# Patient Record
Sex: Male | Born: 1935 | Race: White | Hispanic: No | Marital: Married | Smoking: Former smoker
Health system: Southern US, Community
[De-identification: ages and names within clinical notes are randomized; demographics above are authoritative.]

## PROBLEM LIST (undated history)

## (undated) DIAGNOSIS — M1990 Unspecified osteoarthritis, unspecified site: Secondary | ICD-10-CM

## (undated) DIAGNOSIS — I10 Essential (primary) hypertension: Secondary | ICD-10-CM

## (undated) DIAGNOSIS — K635 Polyp of colon: Secondary | ICD-10-CM

## (undated) DIAGNOSIS — E785 Hyperlipidemia, unspecified: Secondary | ICD-10-CM

## (undated) DIAGNOSIS — I350 Nonrheumatic aortic (valve) stenosis: Secondary | ICD-10-CM

## (undated) DIAGNOSIS — I509 Heart failure, unspecified: Secondary | ICD-10-CM

## (undated) DIAGNOSIS — Z952 Presence of prosthetic heart valve: Secondary | ICD-10-CM

## (undated) DIAGNOSIS — C801 Malignant (primary) neoplasm, unspecified: Secondary | ICD-10-CM

## (undated) DIAGNOSIS — M419 Scoliosis, unspecified: Secondary | ICD-10-CM

## (undated) DIAGNOSIS — I5032 Chronic diastolic (congestive) heart failure: Secondary | ICD-10-CM

## (undated) DIAGNOSIS — M549 Dorsalgia, unspecified: Secondary | ICD-10-CM

## (undated) DIAGNOSIS — K5730 Diverticulosis of large intestine without perforation or abscess without bleeding: Secondary | ICD-10-CM

## (undated) DIAGNOSIS — R234 Changes in skin texture: Secondary | ICD-10-CM

## (undated) DIAGNOSIS — N400 Enlarged prostate without lower urinary tract symptoms: Secondary | ICD-10-CM

## (undated) HISTORY — DX: Scoliosis, unspecified: M41.9

## (undated) HISTORY — DX: Polyp of colon: K63.5

## (undated) HISTORY — DX: Chronic diastolic (congestive) heart failure: I50.32

## (undated) HISTORY — DX: Heart failure, unspecified: I50.9

## (undated) HISTORY — DX: Diverticulosis of large intestine without perforation or abscess without bleeding: K57.30

## (undated) HISTORY — DX: Hyperlipidemia, unspecified: E78.5

## (undated) HISTORY — DX: Benign prostatic hyperplasia without lower urinary tract symptoms: N40.0

## (undated) HISTORY — DX: Dorsalgia, unspecified: M54.9

## (undated) HISTORY — PX: PLEURAL EFFUSION DRAINAGE: SHX5099

## (undated) HISTORY — PX: TONSILLECTOMY: SUR1361

## (undated) HISTORY — PX: TEE WITHOUT CARDIOVERSION: SHX5443

## (undated) HISTORY — DX: Essential (primary) hypertension: I10

## (undated) HISTORY — PX: EYE SURGERY: SHX253

## (undated) HISTORY — DX: Nonrheumatic aortic (valve) stenosis: I35.0

---
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
## 1986-03-20 DIAGNOSIS — S32810B Multiple fractures of pelvis with stable disruption of pelvic ring, initial encounter for open fracture: Secondary | ICD-10-CM | POA: Insufficient documentation

## 1994-02-17 DIAGNOSIS — K5730 Diverticulosis of large intestine without perforation or abscess without bleeding: Secondary | ICD-10-CM | POA: Insufficient documentation

## 1994-03-20 DIAGNOSIS — K648 Other hemorrhoids: Secondary | ICD-10-CM | POA: Insufficient documentation

## 1995-09-05 DIAGNOSIS — E785 Hyperlipidemia, unspecified: Secondary | ICD-10-CM

## 1995-09-05 DIAGNOSIS — K649 Unspecified hemorrhoids: Secondary | ICD-10-CM | POA: Insufficient documentation

## 1995-09-05 HISTORY — DX: Hyperlipidemia, unspecified: E78.5

## 1997-05-18 ENCOUNTER — Encounter: Payer: Self-pay | Admitting: Family Medicine

## 1997-05-18 DIAGNOSIS — M1610 Unilateral primary osteoarthritis, unspecified hip: Secondary | ICD-10-CM

## 1997-05-18 LAB — CONVERTED CEMR LAB: PSA: 0.6 ng/mL

## 1997-06-18 HISTORY — PX: KNEE ARTHROSCOPY: SUR90

## 1999-01-19 ENCOUNTER — Encounter: Payer: Self-pay | Admitting: Family Medicine

## 1999-01-19 LAB — CONVERTED CEMR LAB: PSA: 0.5 ng/mL

## 1999-05-19 DIAGNOSIS — D049 Carcinoma in situ of skin, unspecified: Secondary | ICD-10-CM | POA: Insufficient documentation

## 2000-10-18 DIAGNOSIS — N400 Enlarged prostate without lower urinary tract symptoms: Secondary | ICD-10-CM

## 2000-10-18 HISTORY — DX: Benign prostatic hyperplasia without lower urinary tract symptoms: N40.0

## 2002-04-20 ENCOUNTER — Encounter: Payer: Self-pay | Admitting: Family Medicine

## 2002-04-20 LAB — CONVERTED CEMR LAB: PSA: 0.5 ng/mL

## 2002-05-19 HISTORY — PX: COLONOSCOPY W/ BIOPSIES: SHX1374

## 2002-06-05 DIAGNOSIS — K635 Polyp of colon: Secondary | ICD-10-CM

## 2002-06-05 DIAGNOSIS — K5730 Diverticulosis of large intestine without perforation or abscess without bleeding: Secondary | ICD-10-CM

## 2002-06-05 HISTORY — DX: Polyp of colon: K63.5

## 2002-06-05 HISTORY — DX: Diverticulosis of large intestine without perforation or abscess without bleeding: K57.30

## 2003-04-21 DIAGNOSIS — I509 Heart failure, unspecified: Secondary | ICD-10-CM

## 2003-04-21 HISTORY — DX: Heart failure, unspecified: I50.9

## 2003-05-19 ENCOUNTER — Encounter: Payer: Self-pay | Admitting: Family Medicine

## 2003-05-19 DIAGNOSIS — I10 Essential (primary) hypertension: Secondary | ICD-10-CM

## 2003-05-19 DIAGNOSIS — I110 Hypertensive heart disease with heart failure: Secondary | ICD-10-CM

## 2003-05-19 HISTORY — DX: Essential (primary) hypertension: I10

## 2004-04-25 ENCOUNTER — Ambulatory Visit: Payer: Self-pay | Admitting: Family Medicine

## 2004-06-18 ENCOUNTER — Encounter: Payer: Self-pay | Admitting: Family Medicine

## 2004-06-18 LAB — CONVERTED CEMR LAB: PSA: 0.21 ng/mL

## 2004-06-24 ENCOUNTER — Ambulatory Visit: Payer: Self-pay | Admitting: Family Medicine

## 2004-06-28 ENCOUNTER — Ambulatory Visit: Payer: Self-pay | Admitting: Family Medicine

## 2004-07-13 ENCOUNTER — Ambulatory Visit: Payer: Self-pay | Admitting: Family Medicine

## 2004-11-03 ENCOUNTER — Ambulatory Visit: Payer: Self-pay | Admitting: Family Medicine

## 2004-12-29 ENCOUNTER — Ambulatory Visit: Payer: Self-pay | Admitting: Family Medicine

## 2005-03-22 ENCOUNTER — Ambulatory Visit: Payer: Self-pay | Admitting: Family Medicine

## 2005-05-31 ENCOUNTER — Ambulatory Visit: Payer: Self-pay | Admitting: Family Medicine

## 2005-06-18 ENCOUNTER — Encounter: Payer: Self-pay | Admitting: Family Medicine

## 2005-06-18 LAB — CONVERTED CEMR LAB: PSA: 0.26 ng/mL

## 2005-06-26 ENCOUNTER — Ambulatory Visit: Payer: Self-pay | Admitting: Family Medicine

## 2005-06-29 ENCOUNTER — Ambulatory Visit: Payer: Self-pay | Admitting: Family Medicine

## 2005-07-12 ENCOUNTER — Ambulatory Visit: Payer: Self-pay | Admitting: Family Medicine

## 2005-07-18 HISTORY — PX: FLEXIBLE SIGMOIDOSCOPY: SHX1649

## 2005-07-20 ENCOUNTER — Ambulatory Visit: Payer: Self-pay | Admitting: Gastroenterology

## 2005-07-27 ENCOUNTER — Ambulatory Visit: Payer: Self-pay | Admitting: Gastroenterology

## 2005-07-27 LAB — HM SIGMOIDOSCOPY

## 2005-08-09 ENCOUNTER — Ambulatory Visit: Payer: Self-pay | Admitting: Family Medicine

## 2005-09-21 ENCOUNTER — Ambulatory Visit: Payer: Self-pay | Admitting: Family Medicine

## 2005-11-23 ENCOUNTER — Ambulatory Visit: Payer: Self-pay | Admitting: Family Medicine

## 2005-12-19 ENCOUNTER — Ambulatory Visit: Payer: Self-pay | Admitting: Family Medicine

## 2006-02-14 ENCOUNTER — Ambulatory Visit: Payer: Self-pay | Admitting: Family Medicine

## 2006-06-19 ENCOUNTER — Encounter: Payer: Self-pay | Admitting: Family Medicine

## 2006-07-03 ENCOUNTER — Ambulatory Visit: Payer: Self-pay | Admitting: Family Medicine

## 2006-07-03 LAB — CONVERTED CEMR LAB
ALT: 17 units/L (ref 0–40)
Albumin: 3.8 g/dL (ref 3.5–5.2)
Alkaline Phosphatase: 49 units/L (ref 39–117)
BUN: 13 mg/dL (ref 6–23)
CO2: 33 meq/L — ABNORMAL HIGH (ref 19–32)
Calcium: 9.4 mg/dL (ref 8.4–10.5)
GFR calc Af Amer: 105 mL/min
Pro B Natriuretic peptide (BNP): 52 pg/mL (ref 0.0–100.0)
TSH: 0.98 microintl units/mL (ref 0.35–5.50)
Total CHOL/HDL Ratio: 3.2
Total Protein: 6.4 g/dL (ref 6.0–8.3)
VLDL: 13 mg/dL (ref 0–40)

## 2006-07-05 ENCOUNTER — Ambulatory Visit: Payer: Self-pay | Admitting: Family Medicine

## 2006-07-10 ENCOUNTER — Ambulatory Visit: Payer: Self-pay | Admitting: Cardiology

## 2006-07-19 LAB — CONVERTED CEMR LAB
OCCULT 1: NEGATIVE
OCCULT 2: NEGATIVE
OCCULT 3: NEGATIVE

## 2006-07-20 ENCOUNTER — Ambulatory Visit: Payer: Self-pay

## 2006-07-20 ENCOUNTER — Encounter: Payer: Self-pay | Admitting: Family Medicine

## 2006-07-30 ENCOUNTER — Ambulatory Visit: Payer: Self-pay | Admitting: Family Medicine

## 2006-07-31 ENCOUNTER — Encounter: Payer: Self-pay | Admitting: Family Medicine

## 2006-08-01 ENCOUNTER — Ambulatory Visit: Payer: Self-pay | Admitting: Internal Medicine

## 2006-12-19 ENCOUNTER — Ambulatory Visit: Payer: Self-pay | Admitting: Family Medicine

## 2007-01-02 ENCOUNTER — Ambulatory Visit: Payer: Self-pay | Admitting: Family Medicine

## 2007-01-02 DIAGNOSIS — K5900 Constipation, unspecified: Secondary | ICD-10-CM | POA: Insufficient documentation

## 2007-05-07 ENCOUNTER — Ambulatory Visit: Payer: Self-pay | Admitting: Family Medicine

## 2007-06-26 ENCOUNTER — Ambulatory Visit: Payer: Self-pay | Admitting: Family Medicine

## 2007-06-26 LAB — CONVERTED CEMR LAB
ALT: 26 units/L (ref 0–53)
AST: 23 units/L (ref 0–37)
Albumin: 4 g/dL (ref 3.5–5.2)
BUN: 16 mg/dL (ref 6–23)
Basophils Absolute: 0 10*3/uL (ref 0.0–0.1)
Basophils Relative: 0 % (ref 0.0–1.0)
Calcium: 9.5 mg/dL (ref 8.4–10.5)
Cholesterol: 170 mg/dL (ref 0–200)
Creatinine, Ser: 1 mg/dL (ref 0.4–1.5)
Eosinophils Absolute: 0.1 10*3/uL (ref 0.0–0.7)
Eosinophils Relative: 2.6 % (ref 0.0–5.0)
GFR calc non Af Amer: 77 mL/min
HCT: 39.7 % (ref 39.0–52.0)
Hemoglobin: 13.6 g/dL (ref 13.0–17.0)
MCHC: 34.2 g/dL (ref 30.0–36.0)
MCV: 91.8 fL (ref 78.0–100.0)
Neutro Abs: 3.3 10*3/uL (ref 1.4–7.7)
PSA: 0.21 ng/mL (ref 0.10–4.00)
RBC: 4.32 M/uL (ref 4.22–5.81)
TSH: 0.77 microintl units/mL (ref 0.35–5.50)
Total Bilirubin: 1.1 mg/dL (ref 0.3–1.2)
VLDL: 13 mg/dL (ref 0–40)
WBC: 5.3 10*3/uL (ref 4.5–10.5)

## 2007-07-03 ENCOUNTER — Ambulatory Visit: Payer: Self-pay | Admitting: Family Medicine

## 2007-07-09 ENCOUNTER — Ambulatory Visit: Payer: Self-pay | Admitting: Cardiology

## 2007-12-16 ENCOUNTER — Ambulatory Visit: Payer: Self-pay | Admitting: Family Medicine

## 2007-12-24 ENCOUNTER — Ambulatory Visit: Payer: Self-pay | Admitting: Family Medicine

## 2008-05-25 ENCOUNTER — Ambulatory Visit: Payer: Self-pay | Admitting: Family Medicine

## 2008-06-02 ENCOUNTER — Ambulatory Visit: Payer: Self-pay | Admitting: Cardiology

## 2008-06-23 ENCOUNTER — Encounter: Payer: Self-pay | Admitting: Cardiology

## 2008-06-23 ENCOUNTER — Ambulatory Visit: Payer: Self-pay

## 2008-09-04 ENCOUNTER — Ambulatory Visit: Payer: Self-pay | Admitting: Internal Medicine

## 2008-11-27 ENCOUNTER — Ambulatory Visit: Payer: Self-pay | Admitting: Family Medicine

## 2008-11-27 LAB — CONVERTED CEMR LAB
Albumin: 4.2 g/dL (ref 3.5–5.2)
Basophils Absolute: 0 10*3/uL (ref 0.0–0.1)
Basophils Relative: 0.1 % (ref 0.0–3.0)
CO2: 33 meq/L — ABNORMAL HIGH (ref 19–32)
Calcium: 9.4 mg/dL (ref 8.4–10.5)
Chloride: 100 meq/L (ref 96–112)
Eosinophils Absolute: 0.1 10*3/uL (ref 0.0–0.7)
Glucose, Bld: 93 mg/dL (ref 70–99)
HDL: 62 mg/dL (ref >39.00)
Hemoglobin: 14.6 g/dL (ref 13.0–17.0)
Lymphs Abs: 1.3 10*3/uL (ref 0.7–4.0)
MCHC: 34.5 g/dL (ref 30.0–36.0)
MCV: 93.6 fL (ref 78.0–100.0)
Monocytes Absolute: 0.5 10*3/uL (ref 0.1–1.0)
Neutro Abs: 3.5 10*3/uL (ref 1.4–7.7)
PSA: 0.17 ng/mL (ref 0.10–4.00)
RBC: 4.53 M/uL (ref 4.22–5.81)
RDW: 12.3 % (ref 11.5–14.6)
Sodium: 137 meq/L (ref 135–145)
TSH: 0.88 microintl units/mL (ref 0.35–5.50)
Total CHOL/HDL Ratio: 3
Total Protein: 6.6 g/dL (ref 6.0–8.3)
Triglycerides: 42 mg/dL (ref 0.0–149.0)

## 2008-12-24 ENCOUNTER — Ambulatory Visit: Payer: Self-pay | Admitting: Family Medicine

## 2008-12-31 ENCOUNTER — Ambulatory Visit: Payer: Self-pay | Admitting: Family Medicine

## 2009-01-14 ENCOUNTER — Encounter (INDEPENDENT_AMBULATORY_CARE_PROVIDER_SITE_OTHER): Payer: Self-pay | Admitting: *Deleted

## 2009-01-14 ENCOUNTER — Ambulatory Visit: Payer: Self-pay | Admitting: Family Medicine

## 2009-01-14 LAB — CONVERTED CEMR LAB
OCCULT 1: NEGATIVE
OCCULT 2: NEGATIVE

## 2009-02-19 ENCOUNTER — Ambulatory Visit: Payer: Self-pay | Admitting: Family Medicine

## 2009-04-21 ENCOUNTER — Telehealth: Payer: Self-pay | Admitting: Family Medicine

## 2009-05-13 ENCOUNTER — Ambulatory Visit: Payer: Self-pay | Admitting: Family Medicine

## 2009-06-16 ENCOUNTER — Ambulatory Visit: Payer: Self-pay | Admitting: Family Medicine

## 2009-06-22 ENCOUNTER — Ambulatory Visit: Payer: Self-pay | Admitting: Cardiovascular Disease

## 2009-07-27 ENCOUNTER — Ambulatory Visit: Payer: Self-pay | Admitting: Family Medicine

## 2009-07-27 LAB — CONVERTED CEMR LAB: AST: 20 units/L (ref 0–37)

## 2009-08-24 ENCOUNTER — Telehealth: Payer: Self-pay | Admitting: Family Medicine

## 2009-10-21 ENCOUNTER — Encounter (INDEPENDENT_AMBULATORY_CARE_PROVIDER_SITE_OTHER): Payer: Self-pay | Admitting: *Deleted

## 2009-12-03 ENCOUNTER — Ambulatory Visit: Payer: Self-pay | Admitting: Family Medicine

## 2009-12-23 ENCOUNTER — Telehealth (INDEPENDENT_AMBULATORY_CARE_PROVIDER_SITE_OTHER): Payer: Self-pay | Admitting: *Deleted

## 2009-12-23 ENCOUNTER — Ambulatory Visit: Payer: Self-pay | Admitting: Family Medicine

## 2009-12-23 LAB — CONVERTED CEMR LAB
Alkaline Phosphatase: 54 units/L (ref 39–117)
BUN: 12 mg/dL (ref 6–23)
Basophils Relative: 0.5 % (ref 0.0–3.0)
Bilirubin, Direct: 0.2 mg/dL (ref 0.0–0.3)
CO2: 33 meq/L — ABNORMAL HIGH (ref 19–32)
Chloride: 95 meq/L — ABNORMAL LOW (ref 96–112)
Cholesterol: 150 mg/dL (ref 0–200)
Creatinine, Ser: 0.9 mg/dL (ref 0.4–1.5)
Eosinophils Absolute: 0.1 10*3/uL (ref 0.0–0.7)
Eosinophils Relative: 2.6 % (ref 0.0–5.0)
Glucose, Bld: 86 mg/dL (ref 70–99)
HCT: 37.4 % — ABNORMAL LOW (ref 39.0–52.0)
LDL Cholesterol: 78 mg/dL (ref 0–99)
Lymphs Abs: 1.3 10*3/uL (ref 0.7–4.0)
MCHC: 35.2 g/dL (ref 30.0–36.0)
MCV: 91.8 fL (ref 78.0–100.0)
Monocytes Absolute: 0.4 10*3/uL (ref 0.1–1.0)
Platelets: 199 10*3/uL (ref 150.0–400.0)
Total CHOL/HDL Ratio: 2
WBC: 4.3 10*3/uL — ABNORMAL LOW (ref 4.5–10.5)

## 2009-12-29 ENCOUNTER — Ambulatory Visit: Payer: Self-pay | Admitting: Family Medicine

## 2010-03-03 ENCOUNTER — Encounter: Payer: Self-pay | Admitting: Family Medicine

## 2010-04-21 NOTE — Progress Notes (Signed)
Summary: regarding appt  Phone Note Call from Patient Call back at Home Phone [phone]   Caller: Patient Call For: Shaune Leeks MD Summary of Call: Pt was told to schedule physical appt in august but he says if you are coming back he wants to stay with you.  Advised him that you will probably want him to get established with another doctor but he wanted me to ask. Initial call taken by: Lowella Petties CMA,  August 24, 2009 10:01 AM  Follow-up for Phone Call        Staying with me is fine...hopefully I will know in another week or so. Call back to schedule in 2 weeks, Oct/Nov w/me if they have a schedule for me, Aug with Drs Para March or Guitierrez if not. Follow-up by: Shaune Leeks MD,  August 24, 2009 10:57 AM  Additional Follow-up for Phone Call Additional follow up Details #1::        [facility] for pt to call back.            Lowella Petties CMA  August 24, 2009 11:57 AM  Advised pt, he will call back in a couple of weeks to check. Additional Follow-up by: Lowella Petties CMA,  August 24, 2009 2:39 PM

## 2010-04-21 NOTE — Progress Notes (Signed)
----   Converted from flag ---- ---- 12/23/2009 7:06 AM, Shaune Leeks MD wrote: BMET 401.9 PSA 600.00 CBC 716.95 CHOL PROF 272.4 HEPATIC 272.4 VIT D 780.79 TSH 780.79  ---- 12/22/2009 11:39 AM, Liane Comber CMA (AAMA) wrote:  Lab orders please! Good Morning! This pt is scheduled for cpx labs tomorrow, which labs to draw and dx codes to use? Thanks Rodney Booze   P.S Welcome back!!!!! ------------------------------

## 2010-04-21 NOTE — Assessment & Plan Note (Signed)
Summary: EC6      Allergies Added: NKDA  Visit Type:  EC6 Referring Provider:  Daleen Parker Primary Provider:  Shaune Leeks MD  CC:  some chest pains - depends on how fast he tries to get. No sob and some edema in ankles and feet...  History of Present Illness: [name] is a 77 year old gentleman with no known coronary artery disease, history of hypertension, hyperlipidemia, chronic mild shortness of breath, aortic valve sclerosis who presents for routine followup.  He states that overall he has been doing well. He denies any significant shortness of breath. He is active, with no symptoms of chest pain, lightheadedness or dizziness. He denies any significant lower extremity edema.  He was recently taken off pravastatin and put back on Lipitor. He states that pravastatin did not make him feel well.  Echocardiogram from April 2010 showed normal systolic function, minimal aortic valve stenosis, mildly dilated left atrium, mild TR.  Carotid arterial Doppler done last year showed mild plaquing less than 39% bilaterally.  Last stress test was 3 years ago with no ischemia. He exercised for 5 minutes, 30 seconds. Achieved 13 METS  Preventive Screening-Counseling & Management  Alcohol-Tobacco     Alcohol drinks/day: 0     Smoking Status: quit  Caffeine-Diet-Exercise     Caffeine use/day: 5-6 cups     Does Patient Exercise: no  Current Problems (verified): 1)  Dysfunction of Eustachian Tube  (ICD-381.81) 2)  Tiredness  (ICD-780.79) 3)  Chest Pain  (ICD-786.50) 4)  Constipation, Intermittent  (ICD-564.00) 5)  Screening For Malignannt Neoplasm, Site Nec  (ICD-V76.49) 6)  Family History Diabetes 1st Degree Relative  (ICD-V18.0) 7)  Congestive Heart Failure  (ICD-428.0) 8)  Hypertension  (ICD-401.9) 9)  Benign Prostatic Hypertrophy  (ICD-600.00) 10)  Ca in Situ, Skin Nos  (ICD-232.9) 11)  Arthritis, Hip  (ICD-716.95) 12)  Diverticulosis, Colon  (ICD-562.10) 13)   Hemorrhoids, Internal, With Bleeding  (ICD-455.2) 14)  Hyperlipidemia  (ICD-272.4) 15)  Scoliosis, Thoracic Spine  (ICD-737.30) 16)  Hemorrhoids  (ICD-455.6) 17)  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 1 Tablet in Am 2)  Proscar 5 Mg Tabs (Finasteride) .Marland Kitchen.. 1 Tablet At Bedtime 3)  Captopril 25 Mg Tabs (Captopril) .... Take 1 Tablet By Mouth  in Am 2 Tablets At Night. 4)  Lipitor 40 Mg Tabs (Atorvastatin Calcium) .... One Tab At Night  Allergies (verified): No Known Drug Allergies  Past History:  Past Medical History: Last updated: 06/19/2006 Hyperlipidemia (09/05/1995) Diverticulosis, colon (02/17/1994) Benign prostatic hypertrophy (10/18/2000) Hypertension (05/19/2003) Congestive heart failure (04/21/2003)  Past Surgical History: Last updated: 06/19/2006 ARTHROSCOPY R KNEE 06/1997 FLEX SIG NML       BE DIVERTICS   02/1994 FLEX SIG INT/EXT HEMMS   LESION AT 55CM  01/1999 COLONOSCOPY  INT HEMMS INJECTED DIVERTICS  03/1999 COLONOSCOPY POLYP B9 DIVERTICS INT HEMMS  06/05/2002     05 ECHO EF 65% NML X MILD DIAST DYSFCTN  05/08/2003 FLEX SIG TO 50CM  NO MASS O/W NO CHANGE  07/27/2005  Family History: Last updated: 12/24/2008 Father dec Stroke but natural causes Mother dec 98 natural causes Sister A 80  Fell hip fx Sister dec 30s Natural Sister dec 63s Natural Brother A 72 DM Brother A 83 DM Carotid Stenosis Family History Diabetes 1st degree relative Family History Hypertension Family History of Stroke F 1st degree relative <60 Family History of Stroke M 1st  degree relative <50  Social History: Last updated: 07/03/2007 Occupation: Retired Electrical engineer Single Monogamous  Risk Factors: Alcohol Use: 0 (06/22/2009) Caffeine Use: 5-6 cups (06/22/2009) Exercise: no (06/22/2009)  Risk Factors: Smoking Status: quit  (06/22/2009) Packs/Day: 1975 (12/24/2008) Passive Smoke Exposure: no (12/24/2008)  Social History: Caffeine use/day:  5-6 cups  Review of Systems  The patient denies fever, weight loss, weight gain, vision loss, decreased hearing, hoarseness, chest pain, syncope, dyspnea on exertion, peripheral edema, prolonged cough, abdominal pain, incontinence, muscle weakness, depression, and enlarged lymph nodes.    Vital Signs:  Patient profile:   77 year old male Height:      69.75 inches Weight:      169.25 pounds BMI:     24.55 Pulse rate:   68 / minute Pulse rhythm:   regular BP sitting:   124 / 66  (left arm) Cuff size:   regular  Vitals Entered By: Douglas Parker (June 22, 2009 10:31 AM)  Physical Exam  General:  well-appearing elderly gentleman in no apparent distress, alert and oriented x3, HEENT exam is benign, oropharynx is clear, neck is supple with no JVP or carotid bruits, heart sounds are regular with S1-S2 and 2/6 systolic ejection murmur at the right sternal border, lungs are clear to auscultation with no wheezes or rales, abdominal exam is benign, no significant lower extremity edema, neurologic exam is nonfocal, skin is     EKG  Procedure date:  06/22/2009  Findings:      normal sinus rhythm with rate 68 beats per minute, no significant ST or T wave changes.  Impression & Recommendations:  Problem # 1:  HYPERTENSION (ICD-401.9) blood pressure is well controlled on his current medication regimen. We have made no changes. His updated medication list for this problem includes:    Captopril-hydrochlorothiazide 25-15 Mg Tabs (Captopril-hydrochlorothiazide) .Marland Kitchen... 1 tablet in am    Captopril 25 Mg Tabs (Captopril) .Marland Kitchen... Take 1 tablet by mouth  in am 2 tablets at night.  Problem # 2:  HYPERLIPIDEMIA (ICD-272.4) he does have mild peripheral vascular disease. History of smoking though he stopped 40 years ago. He currently takes an aspirin 81 mg daily. He is on  Lipitor 40 mg daily. Last cholesterol in September 2010 showing LDL around 100. We have suggested that he stay on the same dose of Lipitor if he gets muscle aches in the future, we could change him to low-dose Crestor.  The following medications were removed from the medication list:    Pravastatin Sodium 40 Mg Tabs (Pravastatin sodium) .Marland Kitchen... Take one by mouth every night His updated medication list for this problem includes:    Lipitor 40 Mg Tabs (Atorvastatin calcium) ..... One tab at night  Patient Instructions: 1)  Your physician recommends that you schedule a follow-up appointment in: 1 year 2)  Your physician recommends that you continue on your current medications as directed. Please refer to the Current Medication list given to you today.

## 2010-04-21 NOTE — Assessment & Plan Note (Signed)
Summary: DISCUSS MEDICATION/CLE   Vital Signs:  Patient profile:   77 year old male Weight:      172 pounds Temp:     97.5 degrees F oral Pulse rate:   72 / minute Pulse rhythm:   irregular BP sitting:   148 / 74  (left arm) Cuff size:   regular  Vitals Entered By: Sydell Axon LPN (May 13, 2009 12:15 PM)  History of Present Illness: Pt here for no energy...he feels like he did when on chol medication in the past...bettrer whebn stopped. He has been recentl switched from Lipitor 10 to Pravachol 40 due to insurance cost. He says he tolerated the Lipitor w/o problem. He is not congested and has no focal complaints...nmerely washed out with no energy.  Problems Prior to Update: 1)  Chest Pain  (ICD-786.50) 2)  Constipation, Intermittent  (ICD-564.00) 3)  Screening For Malignannt Neoplasm, Site Nec  (ICD-V76.49) 4)  Family History Diabetes 1st Degree Relative  (ICD-V18.0) 5)  Congestive Heart Failure  (ICD-428.0) 6)  Hypertension  (ICD-401.9) 7)  Benign Prostatic Hypertrophy  (ICD-600.00) 8)  Ca in Situ, Skin Nos  (ICD-232.9) 9)  Arthritis, Hip  (ICD-716.95) 10)  Diverticulosis, Colon  (ICD-562.10) 11)  Hemorrhoids, Internal, With Bleeding  (ICD-455.2) 12)  Hyperlipidemia  (ICD-272.4) 13)  Scoliosis, Thoracic Spine  (ICD-737.30) 14)  Hemorrhoids  (ICD-455.6) 15)  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 1 Tablet in Am 2)  Proscar 5 Mg Tabs (Finasteride) .Marland Kitchen.. 1 Tablet At Bedtime 3)  Captopril 25 Mg Tabs (Captopril) .... Take 1 Tablet By Mouth  in Am 2 Tablets At Night. 4)  Pravachol 40 Mg Tabs (Pravastatin Sodium) .... One Tab By Mouth At Night 5)  Zithromax Z-Pak 250 Mg Tabs (Azithromycin) .... Take By Mouth As Directed 6)  Capital/codeine 120-12 Mg/72ml Susp (Acetaminophen-Codeine) .Marland Kitchen.. 1 Teaspoon At Bedtime As Needed Cough  Allergies: No  Known Drug Allergies  Physical Exam  General:  Well-developed,well-nourished,in no acute distress; alert,appropriate and cooperative throughout examination Head:  normocephalic, atraumatic, and no abnormalities observed.  no sinus tenderness Eyes:  vision grossly intact, pupils equal, pupils round, pupils reactive to light, and no injection.   Ears:  R ear normal and L ear normal.  with moderate cerumen Nose:  nares are congested and injected bilat  Mouth:  pharynx pink and moist, no erythema, and no exudates.   Neck:  supple with full rom and no masses or thyromegally, no JVD or carotid bruit  Lungs:  CTA with mild rhonchi at bases / no rales or crackles or wheeze  good air exch and no labored breathing  Heart:  Normal rate and regular rhythm. Grade 2 mid-systolic murmur present. Abdomen:  Bowel sounds positive,abdomen soft and non-tender without masses, organomegaly or hernias noted.   Impression & Recommendations:  Problem # 1:  TIREDNESS (ICD-780.79) Assessment New Stop Pravachol for one month. RTC then. Cancel next three appts for labsx2 and RTC.   Problem # 2:  HYPERLIPIDEMIA (ICD-272.4)  Will probably restart Lipitor if things return to nml. The following medications were removed from the medication list:    Pravachol 40 Mg Tabs (Pravastatin sodium) ..... One tab by mouth at night  Labs Reviewed: SGOT: 23 (11/27/2008)   SGPT: 32 (11/27/2008)   HDL:62.00 (11/27/2008), 50.7 (06/26/2007)  LDL:107 (11/27/2008), 106 (06/26/2007)  Chol:177 (11/27/2008), 170 (06/26/2007)  Trig:42.0 (11/27/2008), 67 (06/26/2007)  Complete Medication List:  1)  Captopril-hydrochlorothiazide 25-15 Mg Tabs (Captopril-hydrochlorothiazide) .Marland Kitchen.. 1 tablet in am 2)  Proscar 5 Mg Tabs (Finasteride) .Marland Kitchen.. 1 tablet at bedtime 3)  Captopril 25 Mg Tabs (Captopril) .... Take 1 tablet by mouth  in am 2 tablets at night.  Patient Instructions: 1)  RTC one month. 2)  Cnx next three appts.  Current Allergies  (reviewed today): No known allergies

## 2010-04-21 NOTE — Assessment & Plan Note (Signed)
Summary: 1 MONTH FOLLOW UP/[facility]   Vital Signs:  Patient profile:   77 year old male Weight:      169.50 pounds BMI:     24.58 Temp:     97.7 degrees F oral Pulse rate:   72 / minute Pulse rhythm:   regular BP sitting:   130 / 70  (left arm) Cuff size:   regular  Vitals Entered By: Sydell Axon LPN (June 16, 2009 9:09 AM) CC: One month follow-up, Hypertension Management   History of Present Illness: Pt herer for recheck after fatigue and feeling poorly suspected due to Pravastatin and having stopped that for one month. He feels much better now and it appears was due to the medication. He is agreeable to going back on the LIpitor. He is also here for BP check. He also complians of right ear itching and occas feeling like fluid in the ear in the AM. He denies fever or chills or cold sxs.  Hypertension History:      Positive major cardiovascular risk factors include male age 62 years old or older, hyperlipidemia, and hypertension.  Negative major cardiovascular risk factors include non-tobacco-user status.        Positive history for target organ damage include cardiac end organ damage (either CHF or LVH).     Problems Prior to Update: 1)  Tiredness  (ICD-780.79) 2)  Chest Pain  (ICD-786.50) 3)  Constipation, Intermittent  (ICD-564.00) 4)  Screening For Malignannt Neoplasm, Site Nec  (ICD-V76.49) 5)  Family History Diabetes 1st Degree Relative  (ICD-V18.0) 6)  Congestive Heart Failure  (ICD-428.0) 7)  Hypertension  (ICD-401.9) 8)  Benign Prostatic Hypertrophy  (ICD-600.00) 9)  Ca in Situ, Skin Nos  (ICD-232.9) 10)  Arthritis, Hip  (ICD-716.95) 11)  Diverticulosis, Colon  (ICD-562.10) 12)  Hemorrhoids, Internal, With Bleeding  (ICD-455.2) 13)  Hyperlipidemia  (ICD-272.4) 14)  Scoliosis, Thoracic Spine  (ICD-737.30) 15)  Hemorrhoids  (ICD-455.6) 16)  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 1 Tablet in Am 2)  Proscar 5 Mg Tabs (Finasteride) .Marland Kitchen.. 1 Tablet At Bedtime 3)  Captopril 25 Mg Tabs (Captopril) .... Take 1 Tablet By Mouth  in Am 2 Tablets At Night.  Allergies: No Known Drug Allergies  Physical Exam  General:  Well-developed,well-nourished,in no acute distress; alert,appropriate and cooperative throughout examination Head:  normocephalic, atraumatic, and no abnormalities observed.  no sinus tenderness Eyes:  Conjunctiva clear bilaterally.  Ears:  R ear normal and L ear normal.  with minimal cerumen Nose:  nares are congested and injected bilat  Mouth:  pharynx pink and moist, no erythema, and no exudates.   Lungs:  CTA with mild rhonchi at bases / no rales or crackles or wheeze  good air exch and no labored breathing  Heart:  Normal rate and regular rhythm. Grade 2 mid-systolic murmur present.   Impression & Recommendations:  Problem # 1:  HYPERLIPIDEMIA (ICD-272.4) Assessment Unchanged Start back on Lipitor but at 40mg  at night. Recheck and watch liver function. His updated medication list for this problem includes:    Pravastatin Sodium 40 Mg Tabs (Pravastatin sodium) .Marland Kitchen... Take one by mouth every night    Lipitor 40 Mg Tabs (Atorvastatin calcium) ..... One tab at night  Labs Reviewed: SGOT: 23 (11/27/2008)   SGPT: 32 (11/27/2008)  10 Yr Risk Heart Disease: 18 %   HDL:62.00 (11/27/2008), 50.7 (06/26/2007)  LDL:107 (11/27/2008), 106 (06/26/2007)  Chol:177 (11/27/2008), 170 (06/26/2007)  Trig:42.0 (11/27/2008), 67 (06/26/2007)  Problem # 2:  DYSFUNCTION OF EUSTACHIAN TUBE (ICD-381.81) Assessment: New Gargle regularly and use Cortaid in ear in AM on Qtip just inside tragus.  Problem # 3:  HYPERTENSION (ICD-401.9) Assessment: Improved Cont curr meds. His updated medication list for this problem includes:    Captopril-hydrochlorothiazide 25-15 Mg Tabs (Captopril-hydrochlorothiazide) .Marland Kitchen... 1 tablet in am     Captopril 25 Mg Tabs (Captopril) .Marland Kitchen... Take 1 tablet by mouth  in am 2 tablets at night.  BP today: 130/70 Prior BP: 148/74 (05/13/2009)  10 Yr Risk Heart Disease: 18 %  Labs Reviewed: K+: 4.3 (11/27/2008) Creat: : 0.9 (11/27/2008)   Chol: 177 (11/27/2008)   HDL: 62.00 (11/27/2008)   LDL: 107 (11/27/2008)   TG: 42.0 (11/27/2008)  Complete Medication List: 1)  Captopril-hydrochlorothiazide 25-15 Mg Tabs (Captopril-hydrochlorothiazide) .Marland Kitchen.. 1 tablet in am 2)  Proscar 5 Mg Tabs (Finasteride) .Marland Kitchen.. 1 tablet at bedtime 3)  Captopril 25 Mg Tabs (Captopril) .... Take 1 tablet by mouth  in am 2 tablets at night. 4)  Pravastatin Sodium 40 Mg Tabs (Pravastatin sodium) .... Take one by mouth every night 5)  Lipitor 40 Mg Tabs (Atorvastatin calcium) .... One tab at night  Hypertension Assessment/Plan:      The patient's hypertensive risk group is category C: Target organ damage and/or diabetes.  His calculated 10 year risk of coronary heart disease is 18 %.  Today's blood pressure is 130/70.    Patient Instructions: 1)  SGOT, SGPT 272. 4   6 WEEKS 2)  See me in 3 mos, SGOT, SGPT, CHOL PROFILE  272.  4   prior  Prescriptions: LIPITOR 40 MG TABS (ATORVASTATIN CALCIUM) one tab at night  #30 x 12   Entered and Authorized by:   Shaune Leeks MD   Signed by:   Shaune Leeks MD on 06/16/2009   Method used:   Electronically to        [facility]* (retail)       [address]       [place], [place]  [address]       Ph: [phone]       Fax: [phone]   RxID:   [id number]   Current Allergies (reviewed today): No known allergies

## 2010-04-21 NOTE — Letter (Signed)
Summary: [facility],Diagnosis Code for Vitamin D 25 Hydroxy  [facility],Diagnosis Code for Vitamin D 25 Hydroxy   Imported By: Beau Fanny 03/09/2010 13:22:26  _____________________________________________________________________  External Attachment:    Type:   Image     Comment:   External Document

## 2010-04-21 NOTE — Assessment & Plan Note (Signed)
Summary: CHECK UP/CLE   Vital Signs:  Patient profile:   77 year old male Weight:      169.25 pounds Temp:     98.2 degrees F oral Pulse rate:   64 / minute Pulse rhythm:   regular BP sitting:   132 / 70  (left arm) Cuff size:   regular  Vitals Entered By: Sydell Axon LPN (December 29, 2009 1:51 PM) CC: 30 Minute checkup   History of Present Illness: Pt here for followup. He is doing well with no complaints. He has recently seen Dr Lewie Loron with good results. He is tolerating Lipitor, which we switched to last time without a problem.  Preventive Screening-Counseling & Management  Alcohol-Tobacco     Alcohol drinks/day: 0     Smoking Status: quit     Packs/Day: 1975     Passive Smoke Exposure: no  Caffeine-Diet-Exercise     Caffeine use/day: 5-6 cups     Does Patient Exercise: no  Problems Prior to Update: 1)  Constipation, Intermittent  (ICD-564.00) 2)  Screening For Malignannt Neoplasm, Site Nec  (ICD-V76.49) 3)  Family History Diabetes 1st Degree Relative  (ICD-V18.0) 4)  Congestive Heart Failure  (ICD-428.0) 5)  Hypertension  (ICD-401.9) 6)  Benign Prostatic Hypertrophy  (ICD-600.00) 7)  Ca in Situ, Skin Nos  (ICD-232.9) 8)  Arthritis, Hip  (ICD-716.95) 9)  Diverticulosis, Colon  (ICD-562.10) 10)  Hemorrhoids, Internal, With Bleeding  (ICD-455.2) 11)  Hyperlipidemia  (ICD-272.4) 12)  Scoliosis, Thoracic Spine  (ICD-737.30) 13)  Hemorrhoids  (ICD-455.6) 14)  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 1 Tablet in Am 2)  Proscar 5 Mg Tabs (Finasteride) .Marland Kitchen.. 1 Tablet At Bedtime 3)  Captopril 25 Mg Tabs (Captopril) .... Take 1 Tablet By Mouth  in Am 2 Tablets At Night. 4)  Lipitor 40 Mg Tabs (Atorvastatin Calcium) .... One Tab At Night 5)  Zithromax 250 Mg Tabs (Azithromycin) .... 2 By Mouth X1 Day and Then 1 By Mouth X  4days.  Allergies: No Known Drug Allergies  Past History:  Past Medical History: Last updated: 06/19/2006 Hyperlipidemia (09/05/1995) Diverticulosis, colon (02/17/1994) Benign prostatic hypertrophy (10/18/2000) Hypertension (05/19/2003) Congestive heart failure (04/21/2003)  Past Surgical History: Last updated: 06/19/2006 ARTHROSCOPY R KNEE 06/1997 FLEX SIG NML       BE DIVERTICS   02/1994 FLEX SIG INT/EXT HEMMS   LESION AT 55CM  01/1999 COLONOSCOPY  INT HEMMS INJECTED DIVERTICS  03/1999 COLONOSCOPY POLYP B9 DIVERTICS INT HEMMS  06/05/2002     05 ECHO EF 65% NML X MILD DIAST DYSFCTN  05/08/2003 FLEX SIG TO 50CM  NO MASS O/W NO CHANGE  07/27/2005  Family History: Last updated: 12/29/2009 Father dec Stroke but natural causes Mother dec 98 natural causes Sister A 50  Fell hip fx Sister dec 64s Natural Sister dec 67s Natural Brother A 57 DM Brother A 84 DM Carotid Stenosis Family History Diabetes 1st degree relative Family History Hypertension Family History of Stroke F 1st degree relative <60 Family History of Stroke M 1st degree relative <50  Social History: Last updated: 07/03/2007 Occupation: Retired Electrical engineer Single Monogamous  Risk Factors: Alcohol Use: 0 (12/29/2009) Caffeine Use: 5-6 cups (12/29/2009) Exercise: no (12/29/2009)  Risk Factors: Smoking Status: quit (12/29/2009) Packs/Day: 1975 (12/29/2009) Passive Smoke Exposure: no (12/29/2009)  Family History: Father dec Stroke but natural causes Mother dec 98 natural causes Sister A 57  Fell hip  fx Sister dec 58s Natural Sister dec 33s Natural Brother A 74 DM Brother A 52 DM Carotid Stenosis Family History Diabetes 1st degree relative Family History Hypertension Family History of Stroke F 1st degree relative <60 Family History of Stroke M 1st degree relative <50  Review of Systems General:  Denies chills, fatigue, fever, sweats, weakness, and weight loss. Eyes:  Complains of  blurring; denies discharge and eye pain; operations on right three times thus far. ENT:  Denies decreased hearing, ear discharge, and earache. CV:  Complains of chest pain or discomfort; denies fainting, fatigue, palpitations, shortness of breath with exertion, swelling of feet, and swelling of hands; occas with signif rushing.Marland Kitchen Resp:  Denies cough, shortness of breath, and wheezing. GI:  Denies abdominal pain, bloody stools, change in bowel habits, constipation, dark tarry stools, diarrhea, indigestion, loss of appetite, nausea, vomiting, vomiting blood, and yellowish skin color. GU:  Complains of nocturia; denies dysuria, incontinence, and urinary frequency; once. MS:  Complains of low back pain; denies joint pain, muscle aches, muscle weakness, and stiffness. Derm:  Denies dryness, itching, and rash. Neuro:  Denies numbness, poor balance, tingling, and tremors.  Physical Exam  General:  Well-developed,well-nourished,in no acute distress; alert,appropriate and cooperative throughout examination Head:  normocephalic, atraumatic, and no abnormalities observed.  no sinus tenderness Eyes:  Conjunctiva clear bilaterally.  Ears:  R ear normal and L ear normal.  Minimal cerumen bilat. Nose:  External nasal examination shows no deformity or inflammation. Nasal mucosa are pink and moist without lesions or exudates. Mouth:  pharynx pink and moist, no erythema, and no exudates.   Neck:  supple with full rom and no masses or thyromegally, no JVD or carotid bruit  Chest Wall:  No deformities, masses, tenderness or gynecomastia noted. Lungs:  CTA with mild rhonchi at bases / no rales or crackles or wheeze  good air exch and no labored breathing  Heart:  Normal rate and regular rhythm. Grade 2 mid-systolic murmur present. Abdomen:  Bowel sounds positive,abdomen soft and non-tender without masses, organomegaly or hernias noted. Rectal:  No external abnormalities noted. Normal sphincter tone. No rectal  masses or tenderness. G neg. Genitalia:  Testes bilaterally descended without nodularity, tenderness or masses. No scrotal masses or lesions. No penis lesions or urethral discharge. Prostate:  Prostate gland firm and smooth, no enlargement, nodularity, tenderness, mass, asymmetry or induration. 10 gms. Msk:  No deformity but significant stable scoliosis noted of thoracic  spine.   Pulses:  R and L carotid,radial,femoral,dorsalis pedis and posterior tibial pulses are full and equal bilaterally Extremities:  No clubbing, cyanosis, edema, or deformity noted with very mild decreased  range of motion of all extremities..   Neurologic:  No cranial nerve deficits noted. Station and gait are normal. Sensory, motor and coordinative functions appear intact. Skin:  Intact without suspicious lesions or rashes Cervical Nodes:  No lymphadenopathy noted Inguinal Nodes:  No significant adenopathy Psych:  normal affect, talkative and pleasant    Impression & Recommendations:  Problem # 1:  HYPERTENSION (ICD-401.9) Assessment Improved  His updated medication list for this problem includes:    Captopril-hydrochlorothiazide 25-15 Mg Tabs (Captopril-hydrochlorothiazide) .Marland Kitchen... 1 tablet in am    Captopril 25 Mg Tabs (Captopril) .Marland Kitchen... Take 2 tablets by mouth at night.  Orders: Prescription Created Electronically [phone])  BP today: 132/70 Prior BP: 146/64 (12/03/2009)  Prior 10 Yr Risk Heart Disease: 18 % (06/16/2009)  Labs Reviewed: K+: 4.2 (12/23/2009) Creat: : 0.9 (12/23/2009)   Chol: 150 (12/23/2009)  HDL: 61.80 (12/23/2009)   LDL: 78 (12/23/2009)   TG: 50.0 (12/23/2009)  Problem # 2:  BENIGN PROSTATIC HYPERTROPHY (ICD-600.00) Assessment: Unchanged Stable PSA and exam.  Problem # 3:  HYPERLIPIDEMIA (ICD-272.4) Assessment: Improved Excellent nos. Cont Lipitor. His updated medication list for this problem includes:    Lipitor 40 Mg Tabs (Atorvastatin calcium) ..... One tab at night  Labs  Reviewed: SGOT: 23 (12/23/2009)   SGPT: 24 (12/23/2009)  Prior 10 Yr Risk Heart Disease: 18 % (06/16/2009)   HDL:61.80 (12/23/2009), 62.00 (11/27/2008)  LDL:78 (12/23/2009), 107 (86/57/8469)  Chol:150 (12/23/2009), 177 (11/27/2008)  Trig:50.0 (12/23/2009), 42.0 (11/27/2008)  Problem # 4:  SCOLIOSIS, THORACIC SPINE (ICD-737.30) Assessment: Unchanged Stable.  Complete Medication List: 1)  Captopril-hydrochlorothiazide 25-15 Mg Tabs (Captopril-hydrochlorothiazide) .Marland Kitchen.. 1 tablet in am 2)  Proscar 5 Mg Tabs (Finasteride) .Marland Kitchen.. 1 tablet at bedtime 3)  Captopril 25 Mg Tabs (Captopril) .... Take 2 tablets by mouth at night. 4)  Lipitor 40 Mg Tabs (Atorvastatin calcium) .... One tab at night  Other Orders: Flu Vaccine 49yrs + MEDICARE PATIENTS (G2952) Administration Flu vaccine - MCR (W4132)  Patient Instructions: 1)  RTC one year or as needed. 2)  Flu shot today. Prescriptions: CAPTOPRIL-HYDROCHLOROTHIAZIDE 25-15 MG TABS (CAPTOPRIL-HYDROCHLOROTHIAZIDE) 1 TABLET IN AM  #30 Each x 11   Entered and Authorized by:   Shaune Leeks MD   Signed by:   Shaune Leeks MD on 12/29/2009   Method used:   Electronically to        [facility] (retail)       [address]       [place], [place]  [address]       Ph: [phone]       Fax: [phone]   RxID:   [id number]   Current Allergies (reviewed today): No known allergies    Flu Vaccine Consent Questions     Do you have a history of severe allergic reactions to this vaccine? no    Any prior history of allergic reactions to egg and/or gelatin? no    Do you have a sensitivity to the preservative Thimersol? no    Do you have a past history of Guillan-Barre Syndrome? no    Do you currently have an acute febrile illness? no    Have you ever had a severe reaction to latex? no    Vaccine information given and explained to patient? yes    Are you currently pregnant? no    Lot Number:[id number]   Exp Date:09/17/2010    Site Given  Left Deltoid IMedflu

## 2010-04-21 NOTE — Assessment & Plan Note (Signed)
Summary: COLD/CLE   Vital Signs:  Patient profile:   77 year old male Height:      69.75 inches Weight:      168.75 pounds BMI:     24.48 Temp:     97.3 degrees F oral Pulse rate:   76 / minute Pulse rhythm:   regular BP sitting:   146 / 64  (left arm) Cuff size:   regular  Vitals Entered By: Delilah Shan CMA Duncan Dull) (December 03, 2009 4:11 PM) CC: Cold   History of Present Illness: "Terrible cold."   duration of symptoms:3 days rhinorrhea:yes congestion:yes ear pain:no but congested sore throat:started recently cough: some myalgias: "not yet."   no fevers.   other concerns: some discolored sputum occ coughed up, but this was thought to be related to post nasal gtt per patient No sick contacts.   not short of breath   Allergies: No Known Drug Allergies  Review of Systems       See HPI.  Otherwise negative.    Physical Exam  General:  GEN: nad, alert and oriented HEENT: mucous membranes moist, TM w/o erythema, nasal epithelium injected, OP with mild cobblestoning NECK: supple w/o LA CV: rrr. PULM: ctab, no inc wob ABD: soft, +bs EXT: no edema    Impression & Recommendations:  Problem # 1:  URI (ICD-465.9) I would give this some more time to self-resolve.  Likely viral and supportive tx for now.  If not improving with symptoms >1wk, then use zmax.  Not toxic and okay for outpt follow up. He agrees.   Complete Medication List: 1)  Captopril-hydrochlorothiazide 25-15 Mg Tabs (Captopril-hydrochlorothiazide) .Marland Kitchen.. 1 tablet in am 2)  Proscar 5 Mg Tabs (Finasteride) .Marland Kitchen.. 1 tablet at bedtime 3)  Captopril 25 Mg Tabs (Captopril) .... Take 1 tablet by mouth  in am 2 tablets at night. 4)  Lipitor 40 Mg Tabs (Atorvastatin calcium) .... One tab at night 5)  Zithromax 250 Mg Tabs (Azithromycin) .... 2 by mouth x1 day and then 1 by mouth x 4days.  Patient Instructions: 1)  Hold on to the prescription for the zithromax.  Take it if your symptoms aren't getting better in  a few days.  In the meantime, get some rest, drink plenty of fluids, and use the over-the-counter zyrtec for your runny nose.  I think this will get better without the antibiotics.  Let me know if you have questions.  Prescriptions: ZITHROMAX 250 MG TABS (AZITHROMYCIN) 2 by mouth x1 day and then 1 by mouth x 4days.  #6 x 0   Entered and Authorized by:   Crawford Givens MD   Signed by:   Crawford Givens MD on 12/03/2009   Method used:   Print then Give to Patient   RxID:   [id number]   Current Allergies (reviewed today): No known allergies

## 2010-04-21 NOTE — Letter (Signed)
Summary: Douglas Parker letter  [place] [facility]  [address] [place], [place] [address]   Phone: [phone]  Fax: [phone]       10/21/2009 MRN: [id number]  Douglas Parker [address] [place], [place]  [address]  Dear [name] [place] and [place] announce the retirement of Arta Silence, M.D., from full-time practice at the [facility] office effective September 16, 2009 and his plans of returning part-time.  It is important to Dr. Hetty Ely and to our practice that you understand that [facility] has seven physicians in our office for your health care needs.  We will continue to offer the same exceptional care that you have today.    Dr. Hetty Ely has spoken to many of you about his plans for retirement and returning part-time in the fall.   We will continue to work with you through the transition to schedule appointments for you in the office and meet the high standards that [facility] is committed to.   Again, it is with great pleasure that we share the news that Dr. Hetty Ely will return to [facility] in October of 2011 with a reduced schedule.    If you have any questions, or would like to request an appointment with one of our physicians, please call us at [phone] and press the option for Scheduling an appointment.  We take pleasure in providing you with excellent patient care and look forward to seeing you at your next office visit.  Our [facility] Physicians are:  Tillman Abide, M.D. Laurita Quint, M.D. Roxy Manns, M.D. Kerby Nora, M.D. Hannah Beat, M.D. Ruthe Mannan, M.D. We proudly welcomed Raechel Ache, M.D. and Eustaquio Boyden, M.D. to the practice in July/August 2011.  Sincerely,  [facility]

## 2010-04-21 NOTE — Progress Notes (Signed)
Summary: ? About medication  Phone Note From Pharmacy Call back at [phone]   Caller: [facility] Call For: Dr. Hetty Ely  Summary of Call: Received a refill request from pharmacy for Lipitor 20 mg, take one by mouth daily. Called pharmacy and spoke to Viola and informed him that patient is no longer on Lipitor and was changed to Pravachol at office visit on 12/24/08 and was given written rx for this to get filled. Pharmacist said that he will contact the patient with this information. Initial call taken by: Sydell Axon LPN,  April 21, 2009 2:19 PM  Follow-up for Phone Call        noted. Follow-up by: Shaune Leeks MD,  April 21, 2009 2:41 PM

## 2010-07-11 ENCOUNTER — Other Ambulatory Visit: Payer: Self-pay | Admitting: *Deleted

## 2010-07-11 MED ORDER — ATORVASTATIN CALCIUM 40 MG PO TABS: ORAL_TABLET | ORAL | Status: DC

## 2010-08-02 NOTE — Assessment & Plan Note (Signed)
[facility] OFFICE NOTE   NAME:Parker, Douglas MCMILLON                    MRN:          [id number]  DATE:07/09/2007                            DOB:          [birth year]    [name] returns today for further management of his dyspnea on  exertion and hypertension.   Please see my note from July 10, 2006.   Exercise rest stress Myoview showed him to go for 5 minutes and 30  seconds.  His peak heart rate was 131 which is at 92% of predicted heart  rate.  Met level achieved was 13.1.  His maximum blood pressure was 183.   His ejection fraction was 72% with no evidence of ischemia.  We did  reproduce his symptoms of fatigue, chest pain and shortness of breath.   He had had a previous echo in 2005 which showed aortic sclerosis.  His  S2 clearly splits.  I do not think he has significant AS.   We had increased his captopril to 25 b.i.d. and he takes captopril 25/15  in the mornings.  His blood pressure today is 142/75.  I have told just  to double up on his p.m. dose of captopril and so he will be taking 50  mg of captopril in morning, 15 of hydrochlorothiazide and 50 mg in the  evening.  He will check in with Dr. Hetty Ely about his blood pressure.   Will see him back on a p.r.n. basis.     Thomas C. Daleen Squibb, MD, [facility]  Electronically Signed    TCW/MedQ  DD: 07/09/2007  DT: 07/09/2007  Job #: [id number]

## 2010-08-02 NOTE — Assessment & Plan Note (Signed)
[facility] OFFICE NOTE   NAME:Dino, EPHREM CARRICK                    MRN:          [id number]  DATE:06/02/2008                            DOB:          Jul 22, 1927    Mr. Ouk comes in today for followup.   He still complains of dyspnea on exertion, but no difference then it was  2 years ago.  At that time, we performed a stress Myoview which showed  an exercise for 5 minutes in 30 seconds where he had some chest  tightness and shortness of breath.  There were no ST-segment changes.  His EF was 72%.  There was no ischemia.  It was felt to be a low-risk  study.  Secondary prevention therapy has been practiced since that time.   He says his blood pressures have been better.  We have made some  adjustments in his captopril last visit.   He denies any symptoms of TIAs or stroke.   He has a history of aortic sclerosis, but not stenosis on echo in 2005.  He has not had a repeat study.   His meds are captopril HCTZ 25/15 in the morning, and then 25 mg of  captopril by itself in the morning, 50 mg in the evening.  He is on fish  oil 1000 mg a day, vitamin E, vitamin C, multivitamin, enteric-coated  aspirin 81 mg a day, Lipitor 5 mg a day, Proscar 5 mg a day.   PHYSICAL EXAMINATION:  VITAL SIGNS:  His blood pressure is 144/90, his  pulse is 67 and regular.  He says his pressure is usually better in this  outside the office.  His weight is stable 179.  HEENT:  His right eye is injected from procedures.  Otherwise, he has  been wearing eye glasses.  His HEENT is benign.  NECK:  Supple.  Carotid upstrokes were equal bilaterally without any  delay.  There is systolic sounds bilaterally versus bruits.  Thyroid is  not enlarged.  Trachea is midline.  LUNGS:  Clear to auscultation and percussion.  HEART:  Nondisplaced PMI.  It is not sustained.  He has a systolic  murmur heard best really along the right upper sternal  border.  There is  no diastolic component, S2 splits, but not as clearly that did last  visit.  ABDOMEN:  Soft, good bowel sounds.  No midline bruit.  No hepatomegaly.  EXTREMITIES:  No cyanosis, clubbing, or edema.  Pulses are intact.  NEURO:  Intact.   ASSESSMENT:  Mr. Patry is stable with his symptomatology including  some dyspnea and chest tightness with over exertion.  He does have  history of aortic sclerosis and murmur and aortic valve have not been  objectively assessed in 5 years.  He has probably got mild-to-moderate  aortic stenosis by exam.  This could be contributing as well to some  dyspnea on exertion.  He also has carotid bruits versus referred sounds.  With his risk factors for vascular disease, this needs to be ultrasound.   PLAN:  1. A 2-D echocardiogram to assess his  aortic valve.  2. Carotid Dopplers.  3. See me back in a year, otherwise.     Thomas C. Daleen Squibb, MD, [facility]  Electronically Signed    TCW/MedQ  DD: 06/02/2008  DT: 06/03/2008  Job #: [id number]

## 2010-08-05 NOTE — Assessment & Plan Note (Signed)
[facility] OFFICE NOTE   NAME:Fortunato, GREY RAKESTRAW                    MRN:          [id number]  DATE:07/10/2006                            DOB:          05-23-1927    [name] is a delightful 77 year old gentleman referred by  Dr. Laurita Quint for the evaluation of a heart murmur and dyspnea on  exertion.   He has a history of congestive heart failure diagnosed in 2007.  The  records and details of that are unknown.  He had an echocardiogram at  [facility] May 08, 2003, which showed an EF of 65% with mild diastolic  dysfunction, aortic valve sclerosis without stenosis, mitral  calcification with no MR, normal right-sided structures and function,  and normal right atrium and left atrium.  His right ventricular size and  function were also normal.   He does have some significant dyspnea on exertion when he is out playing  golf, etc.   His risk factors are numerous, including age, sex, tobacco use of 30  some years, which he has now quit, hyperlipidemia on Lipitor, and  hypertension.   He denies any orthopnea, PND, or peripheral edema.  He has had really no  classic angina.   PAST MEDICAL HISTORY:  He is currently on captopril.   His medications are:  1. Captopril 25 mg daily.  2. Captopril/hydrochlorothiazide 25/15 daily (?).  3. Proscar 5 mg a day.  4. Lipitor 5 mg a day.  5. Aspirin 81 mg a day.  6. Vitamin C 500 mg a day.  7. Vitamin E 400 mg a day.  8. Multivitamin daily.   HE HAS NO KNOWN DRUG ALLERGIES.  HE HAS NO DYE ALLERGY.   He quit smoking, as mentioned above.  He has a 30-year history, however.  He does not drink.  He does have a couple of caffeinated beverages a  day.  His cholesterol is being treated with Lipitor.  I do not have any  numbers.   SURGERY:  None, other than a skin cancer removed under his right  clavicle.   FAMILY HISTORY:  Negative for premature  coronary disease.   SOCIAL HISTORY:  Retired since 1998.  He has 2 children.   REVIEW OF SYSTEMS:  Other than the HPI, is totally negative.  All  systems reviewed.   EXAMINATION:  His blood pressure is 140/78.  His pulse 69 and regular.  He is 5 feet 10 and weighs 181 pounds.  He is somewhat disheveled.  He looks older than his stated age.  HEENT:  Normocephalic and atraumatic.  PERRLA.  Extraocular movements  intact.  Pupils are dilated.  Facial symmetry is normal.  Carotids are full.  There are soft systolic sounds bilaterally with a  question of a bruit on the left.  No JVD.  Thyroid is not enlarged.  LUNGS:  Reveal decreased breath sounds throughout, but no rhonchi or  wheezes.  CARDIAC EXAM:  Reveals a poorly appreciated PMI.  He has a normal S1 and  S2 with a soft systolic murmur along the left sternal  border, grade 2/6.  S2 splits physiologically.  There is no diastolic component.  No gallop.  ABDOMINAL EXAM:  Soft.  No midline bruit.  There is no hepatomegaly.  EXTREMITIES:  Reveal no cyanosis, clubbing, or edema.  Pulses are  intact.  NEUROLOGIC EXAM:  Intact.   EKG is completely normal.   ASSESSMENT:  1. Dyspnea on exertion with multiple cardiac risk factors.  Rule out      obstructive coronary disease.  2. Hypertension with an unusual dosage strategy with captopril.  I      would think this would be at least a b.i.d. dosing regimen.  3. Systolic murmur consistent with aortic sclerosis, not stenosis.  4. Carotid bruit.   RECOMMENDATIONS:  1. Exercise rest/stress Myoview to rule out obstructive coronary      disease.  2. Carotid Dopplers.  3. Increase captopril to at least 25 b.i.d.  4. Continue all other medications.     Thomas C. Daleen Squibb, MD, [facility]  Electronically Signed    TCW/MedQ  DD: 07/10/2006  DT: 07/10/2006  Job #: [id number]   cc:   Arta Silence, MD

## 2010-08-29 ENCOUNTER — Telehealth: Payer: Self-pay

## 2010-08-29 NOTE — Telephone Encounter (Signed)
LMOM need to make a follow up appointment with Dr. Mariah Milling ASAP!

## 2010-08-31 ENCOUNTER — Encounter: Payer: Self-pay | Admitting: Cardiovascular Disease

## 2010-08-31 ENCOUNTER — Ambulatory Visit (INDEPENDENT_AMBULATORY_CARE_PROVIDER_SITE_OTHER): Payer: Medicare Other | Admitting: Cardiovascular Disease

## 2010-08-31 DIAGNOSIS — I509 Heart failure, unspecified: Secondary | ICD-10-CM

## 2010-08-31 DIAGNOSIS — I10 Essential (primary) hypertension: Secondary | ICD-10-CM

## 2010-08-31 DIAGNOSIS — E785 Hyperlipidemia, unspecified: Secondary | ICD-10-CM

## 2010-08-31 MED ORDER — LISINOPRIL-HYDROCHLOROTHIAZIDE 20-12.5 MG PO TABS: 2.0000 | ORAL_TABLET | Freq: Every day | ORAL | Status: DC

## 2010-08-31 NOTE — Assessment & Plan Note (Signed)
I'm glad that he has been able to tolerate Lipitor. We'll continue him on this medication for now. No known coronary artery disease.

## 2010-08-31 NOTE — Progress Notes (Signed)
   Patient ID: Douglas Parker, male    DOB: 05-28-1927, 77 y.o.   MRN: [id number]  HPI Comments: Douglas Parker is a 77 year old gentleman with no known coronary artery disease, history of hypertension, hyperlipidemia, chronic mild shortness of breath, aortic valve sclerosis who presents for routine followup.   He states that overall he has been doing well. He denies any significant shortness of breath. He is active, with no symptoms of chest pain, lightheadedness or dizziness. He denies any significant lower extremity edema. He is doing well on Lipitor.   Echocardiogram from April 2010 showed normal systolic function, minimal aortic valve stenosis, mildly dilated left atrium, mild TR. Carotid arterial Doppler done last year showed mild plaquing less than 39% bilaterally. Last stress test was 3 years ago with no ischemia. He exercised for 5 minutes, 30 seconds. Achieved 13 METS  EKG shows normal sinus rhythm with rate 67 beats per minute, no significant ST or T wave changes.      Review of Systems  Constitutional: Negative.   HENT: Negative.   Eyes: Negative.   Respiratory: Negative.   Cardiovascular: Negative.   Gastrointestinal: Negative.   Musculoskeletal: Negative.   Skin: Negative.   Neurological: Negative.   Hematological: Negative.   Psychiatric/Behavioral: Negative.   All other systems reviewed and are negative.    BP 159/75  Pulse 67  Ht 5\' 9"  (1.753 m)  Wt 164 lb (74.39 kg)  BMI 24.22 kg/m2  Physical Exam  Nursing note and vitals reviewed. Constitutional: He is oriented to person, place, and time. He appears well-developed and well-nourished.  HENT:  Head: Normocephalic.  Nose: Nose normal.  Mouth/Throat: Oropharynx is clear and moist.  Eyes: Conjunctivae are normal. Pupils are equal, round, and reactive to light.  Neck: Normal range of motion. Neck supple. No JVD present. Carotid bruit is present.  Cardiovascular: Normal rate, regular rhythm, S1 normal,  S2 normal and intact distal pulses.  Exam reveals no gallop and no friction rub.   Murmur heard.  Crescendo systolic murmur is present with a grade of 2/6  Pulmonary/Chest: Effort normal and breath sounds normal. No respiratory distress. He has no wheezes. He has no rales. He exhibits no tenderness.  Abdominal: Soft. Bowel sounds are normal. He exhibits no distension. There is no tenderness.  Musculoskeletal: Normal range of motion. He exhibits no edema and no tenderness.  Lymphadenopathy:    He has no cervical adenopathy.  Neurological: He is alert and oriented to person, place, and time. Coordination normal.  Skin: Skin is warm and dry. No rash noted. No erythema.  Psychiatric: He has a normal mood and affect. His behavior is normal. Judgment and thought content normal.           Assessment and Plan

## 2010-08-31 NOTE — Patient Instructions (Signed)
You are doing well. Stop captopril and captopril with HCT Start lisinopril/HCT once a day. Please call us if you have new issues that need to be addressed before your next appt.  We will call you for a follow up Appt. In 12 months

## 2010-08-31 NOTE — Assessment & Plan Note (Signed)
He reports taking his captopril several times through the day. We did discuss this medicine with him in detail that it is relatively short acting. He is interested in trying 13 today medication to see if this might hold his blood pressure in place. We will change him to lisinopril HCT 40/25 mg daily. I've asked him to monitor his blood pressure at home over the next week or so and call our office if it seems to run higher than normal. He reports it has been running in the 13 to low 140 range systolic at home.

## 2010-10-11 ENCOUNTER — Encounter: Payer: Self-pay | Admitting: Family Medicine

## 2010-10-12 ENCOUNTER — Ambulatory Visit (INDEPENDENT_AMBULATORY_CARE_PROVIDER_SITE_OTHER): Payer: Medicare Other | Admitting: Family Medicine

## 2010-10-12 ENCOUNTER — Encounter: Payer: Self-pay | Admitting: Family Medicine

## 2010-10-12 DIAGNOSIS — R42 Dizziness and giddiness: Secondary | ICD-10-CM

## 2010-10-12 DIAGNOSIS — M4120 Other idiopathic scoliosis, site unspecified: Secondary | ICD-10-CM

## 2010-10-12 DIAGNOSIS — I10 Essential (primary) hypertension: Secondary | ICD-10-CM

## 2010-10-12 MED ORDER — LISINOPRIL 20 MG PO TABS: 20.0000 mg | ORAL_TABLET | Freq: Every day | ORAL | Status: DC

## 2010-10-12 NOTE — Patient Instructions (Addendum)
RTC one month for BP check and recheck dizziness.

## 2010-10-12 NOTE — Assessment & Plan Note (Signed)
Think he has arthritis from this situation. Discussed acute trmt and avoiding NSAIDs.

## 2010-10-12 NOTE — Assessment & Plan Note (Signed)
Great control after being elevated last time. Will follow due to decreasing medication. BP Readings from Last 3 Encounters:  10/12/10 104/68  08/31/10 159/75  12/29/09 132/70

## 2010-10-12 NOTE — Assessment & Plan Note (Signed)
Will stop HCTZ and see if sxs improve. Will follow. BP Readings from Last 3 Encounters:  10/12/10 104/68  08/31/10 159/75  12/29/09 132/70

## 2010-10-12 NOTE — Progress Notes (Signed)
  Subjective:    Patient ID: Douglas Parker, male    DOB: [birth year], 77 y.o.   MRN: [id number]  HPI Pt here as acute appt for dizziness, neck and shoulder pain. His dizziness is mild but as best can tell, sounds like it started after changing his medication last month to Lisinopril/Hctz. He has had no other reactions that he can tell.  He has crepitus and pain with ROM of the neck. He has never had arthritis there that he is aware of but has known significant arthritis of the lower back.     Review of Systems  Constitutional: Negative for fever, chills, diaphoresis, activity change, appetite change and fatigue.  HENT: Negative for hearing loss, ear pain, congestion, sore throat, rhinorrhea, neck pain, neck stiffness, postnasal drip, sinus pressure, tinnitus and ear discharge.   Eyes: Negative for pain, discharge and visual disturbance.  Respiratory: Negative for cough, shortness of breath and wheezing.   Cardiovascular: Negative for chest pain and palpitations.       No SOB w/ exertion  Gastrointestinal:       No heartburn or swallowing problems.  Genitourinary:       No nocturia  Skin:       No itching or dryness.  Neurological:       No tingling or balance problems.  All other systems reviewed and are negative.       Objective:   Physical Exam  Constitutional: He is oriented to person, place, and time. He appears well-developed and well-nourished. No distress.  HENT:  Head: Normocephalic and atraumatic.  Right Ear: External ear normal.  Left Ear: External ear normal.  Nose: Nose normal.  Mouth/Throat: Oropharynx is clear and moist.  Eyes: Conjunctivae and EOM are normal. Pupils are equal, round, and reactive to light. Right eye exhibits no discharge. Left eye exhibits no discharge. No scleral icterus.  Neck: Normal range of motion. Neck supple. No thyromegaly present.  Cardiovascular: Normal rate, regular rhythm, normal heart sounds and intact distal pulses.   No murmur  heard. Pulmonary/Chest: Effort normal and breath sounds normal. No respiratory distress. He has no wheezes.  Abdominal: Soft. Bowel sounds are normal. He exhibits no distension and no mass. There is no tenderness. There is no rebound and no guarding.  Genitourinary: Rectum normal, prostate normal and penis normal. Guaiac negative stool.  Musculoskeletal: Normal range of motion. He exhibits no edema.       Palpable crepitus of the neck with ROM. No swelling or erythema noted.  Lymphadenopathy:    He has no cervical adenopathy.  Neurological: He is alert and oriented to person, place, and time. No cranial nerve deficit. Coordination normal.       General Neuro exam reasonably nml.  Skin: Skin is warm and dry. No rash noted. He is not diaphoretic.  Psychiatric: He has a normal mood and affect. His behavior is normal. Judgment and thought content normal.          Assessment & Plan:

## 2010-10-13 ENCOUNTER — Ambulatory Visit: Payer: Medicare Other | Admitting: Family Medicine

## 2010-11-10 ENCOUNTER — Encounter: Payer: Self-pay | Admitting: Family Medicine

## 2010-11-10 ENCOUNTER — Ambulatory Visit (INDEPENDENT_AMBULATORY_CARE_PROVIDER_SITE_OTHER): Payer: Medicare Other | Admitting: Family Medicine

## 2010-11-10 DIAGNOSIS — R42 Dizziness and giddiness: Secondary | ICD-10-CM

## 2010-11-10 DIAGNOSIS — I10 Essential (primary) hypertension: Secondary | ICD-10-CM

## 2010-11-10 NOTE — Assessment & Plan Note (Signed)
Good control. Cont Lisinopril 20mg  daily. BP Readings from Last 3 Encounters:  11/10/10 130/64  10/12/10 104/68  08/31/10 159/75

## 2010-11-10 NOTE — Progress Notes (Signed)
  Subjective:    Patient ID: Douglas Parker, male    DOB: Nov 17, 1927, 77 y.o.   MRN: [id number]  HPI Pt here for followup. Has seen Dr Mariah Milling and had high BP at that time. He changed his meds to where he is now taking only Lisinopril 20mg  once a day for his BP. He was seen one month ago not long after the change because he was having dizziness which has now resolved. Going to 20mg  immediately once a day may have been too much initially, thus causing the dizziness. He has now adjusted and feels well and seems to have good BP control with the once a day dosing of the Lisinopril.    Review of Systems  Constitutional: Negative for fever, chills, diaphoresis, activity change, appetite change and fatigue.  HENT: Negative for hearing loss, ear pain, congestion, sore throat, rhinorrhea, neck pain, neck stiffness, postnasal drip, sinus pressure, tinnitus and ear discharge.   Eyes: Negative for pain, discharge and visual disturbance.  Respiratory: Negative for cough, shortness of breath and wheezing.   Cardiovascular: Negative for chest pain and palpitations.       No SOB w/ exertion  Gastrointestinal:       No heartburn or swallowing problems.  Genitourinary:       No nocturia  Skin:       No itching or dryness.  Neurological:       No tingling or balance problems.  All other systems reviewed and are negative.       Objective:   Physical Exam  Constitutional: He appears well-developed and well-nourished. No distress.  HENT:  Head: Normocephalic and atraumatic.  Right Ear: External ear normal.  Left Ear: External ear normal.  Nose: Nose normal.  Mouth/Throat: Oropharynx is clear and moist.  Eyes: Conjunctivae and EOM are normal. Pupils are equal, round, and reactive to light. Right eye exhibits no discharge. Left eye exhibits no discharge.  Neck: Normal range of motion. Neck supple.  Cardiovascular: Normal rate and regular rhythm.   Pulmonary/Chest: Effort normal and breath sounds  normal. He has no wheezes.  Lymphadenopathy:    He has no cervical adenopathy.  Skin: He is not diaphoretic.          Assessment & Plan:

## 2010-11-10 NOTE — Assessment & Plan Note (Signed)
Resolved

## 2010-11-27 ENCOUNTER — Other Ambulatory Visit: Payer: Self-pay | Admitting: Family Medicine

## 2010-12-29 ENCOUNTER — Other Ambulatory Visit: Payer: Self-pay | Admitting: Family Medicine

## 2010-12-29 DIAGNOSIS — N400 Enlarged prostate without lower urinary tract symptoms: Secondary | ICD-10-CM

## 2010-12-29 DIAGNOSIS — I10 Essential (primary) hypertension: Secondary | ICD-10-CM

## 2010-12-29 DIAGNOSIS — I509 Heart failure, unspecified: Secondary | ICD-10-CM

## 2010-12-29 DIAGNOSIS — E785 Hyperlipidemia, unspecified: Secondary | ICD-10-CM

## 2011-01-04 ENCOUNTER — Other Ambulatory Visit (INDEPENDENT_AMBULATORY_CARE_PROVIDER_SITE_OTHER): Payer: Medicare Other

## 2011-01-04 DIAGNOSIS — I509 Heart failure, unspecified: Secondary | ICD-10-CM

## 2011-01-04 DIAGNOSIS — I10 Essential (primary) hypertension: Secondary | ICD-10-CM

## 2011-01-04 DIAGNOSIS — E785 Hyperlipidemia, unspecified: Secondary | ICD-10-CM

## 2011-01-04 DIAGNOSIS — N400 Enlarged prostate without lower urinary tract symptoms: Secondary | ICD-10-CM

## 2011-01-04 DIAGNOSIS — Z23 Encounter for immunization: Secondary | ICD-10-CM

## 2011-01-04 LAB — CBC WITH DIFFERENTIAL/PLATELET
Basophils Absolute: 0 10*3/uL (ref 0.0–0.1)
Basophils Relative: 0.6 % (ref 0.0–3.0)
Eosinophils Relative: 4.3 % (ref 0.0–5.0)
Lymphocytes Relative: 30.7 % (ref 12.0–46.0)
Lymphs Abs: 1.4 10*3/uL (ref 0.7–4.0)
MCV: 94.7 fl (ref 78.0–100.0)
Neutrophils Relative %: 54.7 % (ref 43.0–77.0)
Platelets: 156 10*3/uL (ref 150.0–400.0)
RBC: 4.09 Mil/uL — ABNORMAL LOW (ref 4.22–5.81)
WBC: 4.6 10*3/uL (ref 4.5–10.5)

## 2011-01-04 LAB — HEPATIC FUNCTION PANEL
Albumin: 4.1 g/dL (ref 3.5–5.2)
Alkaline Phosphatase: 49 U/L (ref 39–117)
Total Protein: 6.3 g/dL (ref 6.0–8.3)

## 2011-01-04 LAB — LIPID PANEL
HDL: 61.5 mg/dL (ref >39.00)
Triglycerides: 30 mg/dL (ref 0.0–149.0)

## 2011-01-04 LAB — RENAL FUNCTION PANEL
CO2: 30 mEq/L (ref 19–32)
Calcium: 9.2 mg/dL (ref 8.4–10.5)
Chloride: 103 mEq/L (ref 96–112)
Creatinine, Ser: 0.9 mg/dL (ref 0.4–1.5)
Sodium: 138 mEq/L (ref 135–145)

## 2011-01-11 ENCOUNTER — Ambulatory Visit (INDEPENDENT_AMBULATORY_CARE_PROVIDER_SITE_OTHER): Payer: Medicare Other | Admitting: Family Medicine

## 2011-01-11 ENCOUNTER — Encounter: Payer: Self-pay | Admitting: Family Medicine

## 2011-01-11 VITALS — BP 158/68 | HR 76 | Temp 97.6°F | Ht 69.0 in | Wt 160.2 lb

## 2011-01-11 DIAGNOSIS — D049 Carcinoma in situ of skin, unspecified: Secondary | ICD-10-CM

## 2011-01-11 DIAGNOSIS — I509 Heart failure, unspecified: Secondary | ICD-10-CM

## 2011-01-11 DIAGNOSIS — K5730 Diverticulosis of large intestine without perforation or abscess without bleeding: Secondary | ICD-10-CM

## 2011-01-11 DIAGNOSIS — I10 Essential (primary) hypertension: Secondary | ICD-10-CM

## 2011-01-11 DIAGNOSIS — E785 Hyperlipidemia, unspecified: Secondary | ICD-10-CM

## 2011-01-11 DIAGNOSIS — M4120 Other idiopathic scoliosis, site unspecified: Secondary | ICD-10-CM

## 2011-01-11 MED ORDER — LISINOPRIL-HYDROCHLOROTHIAZIDE 20-12.5 MG PO TABS: 1.0000 | ORAL_TABLET | Freq: Every day | ORAL | Status: DC

## 2011-01-11 MED ORDER — CYCLOBENZAPRINE HCL 10 MG PO TABS: 10.0000 mg | ORAL_TABLET | Freq: Three times a day (TID) | ORAL | Status: DC | PRN

## 2011-01-11 NOTE — Progress Notes (Signed)
Subjective:    Patient ID: Douglas Parker, male    DOB: [birth year], 77 y.o.   MRN: [id number]  HPI Pt here for Comp Exam. His BP med had been adjusted by Cardiolgy and last time his BP was better. Today it is high again. He does not check it otherwise.  He is having hip pain for the last three to four days. He bent over to pick up something and it started hurting. The pain moves on him but is typically around the pelvic brim posteriorally and laterally depending on the time..   Review of Systems  Constitutional: Negative for fever, chills, diaphoresis, appetite change, fatigue and unexpected weight change.  HENT: Negative for hearing loss, ear pain, tinnitus and ear discharge.   Eyes: Negative for pain, discharge and visual disturbance.       Seeing Opthalmology for cataracts.   Respiratory: Negative for cough, shortness of breath and wheezing.   Cardiovascular: Negative for chest pain and palpitations.       No SOB w/ exertion  Gastrointestinal: Negative for nausea, vomiting, abdominal pain, diarrhea, constipation and blood in stool.       No heartburn or swallowing problems.  Genitourinary: Negative for dysuria, frequency and difficulty urinating.       No nocturia to once at night.  Musculoskeletal: Negative for myalgias, back pain and arthralgias.       See HPI.  Skin: Negative for rash.       No itching or dryness.  Neurological: Negative for tremors and numbness.       No tingling or balance problems.  Hematological: Negative for adenopathy. Does not bruise/bleed easily.  Psychiatric/Behavioral: Negative for dysphoric mood and agitation.       Objective:   Physical Exam  Constitutional: He is oriented to person, place, and time. He appears well-developed and well-nourished. No distress.  HENT:  Head: Normocephalic and atraumatic.  Right Ear: External ear normal.  Left Ear: External ear normal.  Nose: Nose normal.  Mouth/Throat: Oropharynx is clear and moist.  Eyes:  Conjunctivae and EOM are normal. Pupils are equal, round, and reactive to light. Right eye exhibits no discharge. Left eye exhibits no discharge. No scleral icterus.  Neck: Normal range of motion. Neck supple. No thyromegaly present.  Cardiovascular: Normal rate, regular rhythm, normal heart sounds and intact distal pulses.   No murmur heard. Pulmonary/Chest: Effort normal and breath sounds normal. No respiratory distress. He has no wheezes.  Abdominal: Soft. Bowel sounds are normal. He exhibits no distension and no mass. There is no tenderness. There is no rebound and no guarding.  Genitourinary: Rectum normal, prostate normal and penis normal. Guaiac negative stool.  Musculoskeletal: He exhibits no edema.       ROM limited slightly at the waist but with movement is able to move in nml ROM in all directions. Pain is over the pelvic brim in the lateral and just medial to lat collat line posterioraly on the right side.  He also has significant stable scoliosis of the entire back which is deforming but thus far has not caused breathing compromise.  Lymphadenopathy:    He has no cervical adenopathy.  Neurological: He is alert and oriented to person, place, and time. Coordination normal.  Skin: Skin is warm and dry. No rash noted. He is not diaphoretic.       Signif AKs, SKs and benign moles throughout. Nonhealing lesion on the left forearm that could be SCC. Has derm appt coming up in approx  one month. Can be biopsied at that time.  Psychiatric: He has a normal mood and affect. His behavior is normal. Judgment and thought content normal.          Assessment & Plan:  HMPE  I have personally reviewed the Medicare Annual Wellness questionnaire and have noted 1. The patient's medical and social history 2. Their use of alcohol, tobacco or illicit drugs 3. Their current medications and supplements 4. The patient's functional ability including ADL's, fall risks, home safety risks and hearing or  visual             impairment. 5. Diet and physical activities 6. Evidence for depression or mood disorders No evidence of cognitive decline.

## 2011-01-11 NOTE — Assessment & Plan Note (Signed)
Keep appt for derm check.

## 2011-01-11 NOTE — Assessment & Plan Note (Signed)
Stable with no progression. Cont to follow.

## 2011-01-11 NOTE — Assessment & Plan Note (Signed)
Discussed coming in for prolonged LLQ discomfort. 

## 2011-01-11 NOTE — Assessment & Plan Note (Signed)
Adequate control. Cont Lipitor. Lab Results  Component Value Date   CHOL 157 01/04/2011   HDL 61.50 01/04/2011   LDLCALC 90 01/04/2011   TRIG 30.0 01/04/2011   CHOLHDL 3 01/04/2011

## 2011-01-11 NOTE — Assessment & Plan Note (Addendum)
Mildly elevated. Has been great in the past. Add HCTZ to Lisinopril and recheck. Will follow. BP Readings from Last 3 Encounters:  01/11/11 158/68  11/10/10 130/64  10/12/10 104/68

## 2011-01-11 NOTE — Assessment & Plan Note (Signed)
Stable. Cont curr meds.

## 2011-01-11 NOTE — Patient Instructions (Addendum)
RTC 3 mos for BP check. Dr Para March. Bmet prior.  Take Tyl 2 tabs three times a day and Flexeril as well. Ice and heat as discussed.

## 2011-02-10 ENCOUNTER — Other Ambulatory Visit: Payer: Self-pay | Admitting: *Deleted

## 2011-02-10 MED ORDER — ATORVASTATIN CALCIUM 40 MG PO TABS: ORAL_TABLET | ORAL | Status: DC

## 2011-03-06 DIAGNOSIS — H3530 Unspecified macular degeneration: Secondary | ICD-10-CM | POA: Insufficient documentation

## 2011-03-06 DIAGNOSIS — H2510 Age-related nuclear cataract, unspecified eye: Secondary | ICD-10-CM | POA: Insufficient documentation

## 2011-03-23 ENCOUNTER — Ambulatory Visit (INDEPENDENT_AMBULATORY_CARE_PROVIDER_SITE_OTHER): Payer: Medicare Other | Admitting: Family Medicine

## 2011-03-23 ENCOUNTER — Encounter: Payer: Self-pay | Admitting: Family Medicine

## 2011-03-23 DIAGNOSIS — D049 Carcinoma in situ of skin, unspecified: Secondary | ICD-10-CM

## 2011-03-23 DIAGNOSIS — N400 Enlarged prostate without lower urinary tract symptoms: Secondary | ICD-10-CM

## 2011-03-23 DIAGNOSIS — I10 Essential (primary) hypertension: Secondary | ICD-10-CM

## 2011-03-23 LAB — BASIC METABOLIC PANEL
BUN: 20 mg/dL (ref 6–23)
Chloride: 101 mEq/L (ref 96–112)
Creatinine, Ser: 1 mg/dL (ref 0.4–1.5)

## 2011-03-23 NOTE — Progress Notes (Signed)
He had f/u with Dr. Roseanne Reno with derm about the lesions on his R arm, both were removed and pt was told that he didn't need any other treatment on them.  Hypertension:    Using medication without problems or lightheadedness: yes Chest pain with exertion:no Edema:no Short of breath:no Average home BPs: not checked  Due for BMET today and then f/u labs in 10/13.   Meds, vitals, and allergies reviewed.   PMH and SH reviewed  ROS: See HPI.  Otherwise negative.    GEN: nad, alert and oriented HEENT: mucous membranes moist NECK: supple w/o LA CV: rrr. Murmur noted.  PULM: ctab, no inc wob ABD: soft, +bs EXT: no edema SKIN: no acute rash B hand contracture noted.

## 2011-03-23 NOTE — Patient Instructions (Addendum)
You can get your results through our phone system.  Follow the instructions on the blue card. Check with your insurance to see if they will cover the shingles shot. Don't change your meds for now.   Come back for labs in 10/13 before a visit.   Take care.

## 2011-03-24 NOTE — Assessment & Plan Note (Signed)
Controlled, no change in meds, d/w pt about getting labs now and then later in 2013. He agrees. F/u prn.

## 2011-03-24 NOTE — Assessment & Plan Note (Signed)
Site healed, per derm

## 2011-03-28 ENCOUNTER — Other Ambulatory Visit: Payer: Medicare Other

## 2011-04-19 DIAGNOSIS — H2640 Unspecified secondary cataract: Secondary | ICD-10-CM | POA: Insufficient documentation

## 2011-06-05 ENCOUNTER — Other Ambulatory Visit: Payer: Self-pay | Admitting: Family Medicine

## 2011-06-23 DIAGNOSIS — H11009 Unspecified pterygium of unspecified eye: Secondary | ICD-10-CM | POA: Insufficient documentation

## 2011-06-23 DIAGNOSIS — H3589 Other specified retinal disorders: Secondary | ICD-10-CM | POA: Insufficient documentation

## 2011-09-01 ENCOUNTER — Ambulatory Visit (INDEPENDENT_AMBULATORY_CARE_PROVIDER_SITE_OTHER): Payer: Medicare Other | Admitting: Cardiovascular Disease

## 2011-09-01 ENCOUNTER — Encounter: Payer: Self-pay | Admitting: Cardiovascular Disease

## 2011-09-01 VITALS — BP 130/62 | HR 65 | Ht 70.0 in | Wt 155.0 lb

## 2011-09-01 DIAGNOSIS — R0789 Other chest pain: Secondary | ICD-10-CM

## 2011-09-01 DIAGNOSIS — E785 Hyperlipidemia, unspecified: Secondary | ICD-10-CM

## 2011-09-01 DIAGNOSIS — I10 Essential (primary) hypertension: Secondary | ICD-10-CM

## 2011-09-01 NOTE — Patient Instructions (Addendum)
You are doing well. No medication changes were made.  Please call us if you have new issues that need to be addressed before your next appt.  Your physician wants you to follow-up in: 12 months.  You will receive a reminder letter in the mail two months in advance. If you don't receive a letter, please call our office to schedule the follow-up appointment. 

## 2011-09-01 NOTE — Assessment & Plan Note (Signed)
Cholesterol is at goal on the current lipid regimen. No changes to the medications were made.  

## 2011-09-01 NOTE — Assessment & Plan Note (Signed)
Blood pressure is well controlled on today's visit. No changes made to the medications. 

## 2011-09-01 NOTE — Progress Notes (Signed)
Patient ID: Douglas Parker, male    DOB: 01/26/1928, 78 y.o.   MRN: [id number]  HPI Comments: Douglas Parker is a 78 year old gentleman with no known coronary artery disease, history of hypertension, hyperlipidemia, chronic mild shortness of breath, aortic valve sclerosis who presents for routine followup.   Douglas Parker states that overall Douglas Parker has been doing well. Douglas Parker denies any significant shortness of breath. Douglas Parker is active, with no symptoms of chest pain, lightheadedness or dizziness. Douglas Parker denies any significant lower extremity edema. Douglas Parker is doing well on Lipitor. Presentation is similar to his prior visit last year.   Echocardiogram from April 2010 showed normal systolic function, minimal aortic valve stenosis, mildly dilated left atrium, mild TR. Carotid arterial Doppler done last year showed mild plaquing less than 39% bilaterally. Last stress test was 3 years ago with no ischemia. Douglas Parker exercised for 5 minutes, 30 seconds. Achieved 13 METS  EKG shows normal sinus rhythm with rate 65 beats per minute, no significant ST or T wave changes.   Outpatient Encounter Prescriptions as of 09/01/2011  Medication Sig Dispense Refill  . aspirin 81 MG EC tablet Take 81 mg by mouth daily.        Marland Kitchen atorvastatin (LIPITOR) 40 MG tablet Take 1 tablet by mouth at bedtime  30 tablet  12  . finasteride (PROSCAR) 5 MG tablet TAKE ONE TABLET BY MOUTH EVERY DAY  30 tablet  6  . lisinopril-hydrochlorothiazide (ZESTORETIC) 20-12.5 MG per tablet Take 1 tablet by mouth daily.  30 tablet  11  . Multiple Vitamin (MULTIVITAMIN) tablet Take 1 tablet by mouth daily.        . multivitamin-lutein (OCUVITE-LUTEIN) CAPS Take 1 capsule by mouth daily.        . Omega-3 Fatty Acids (FISH OIL) 1000 MG CAPS Take by mouth daily.        . vitamin C (ASCORBIC ACID) 500 MG tablet Take 500 mg by mouth daily.        . vitamin E 400 UNIT capsule Take 400 Units by mouth daily.        Marland Kitchen DISCONTD: cyclobenzaprine (FLEXERIL) 10 MG tablet Take 1  tablet (10 mg total) by mouth every 8 (eight) hours as needed for muscle spasms.  50 tablet  1    Review of Systems  Constitutional: Negative.   HENT: Negative.   Eyes: Negative.   Respiratory: Negative.   Cardiovascular: Negative.   Gastrointestinal: Negative.   Musculoskeletal: Negative.   Skin: Negative.   Neurological: Negative.   Hematological: Negative.   Psychiatric/Behavioral: Negative.   All other systems reviewed and are negative.    BP 130/62  Pulse 65  Ht 5\' 10"  (1.778 m)  Wt 155 lb (70.308 kg)  BMI 22.24 kg/m2  Physical Exam  Nursing note and vitals reviewed. Constitutional: Douglas Parker is oriented to person, place, and time. Douglas Parker appears well-developed and well-nourished.  HENT:  Head: Normocephalic.  Nose: Nose normal.  Mouth/Throat: Oropharynx is clear and moist.  Eyes: Conjunctivae are normal. Pupils are equal, round, and reactive to light.  Neck: Normal range of motion. Neck supple. No JVD present. Carotid bruit is present.  Cardiovascular: Normal rate, regular rhythm, S1 normal, S2 normal and intact distal pulses.  Exam reveals no gallop and no friction rub.   Murmur heard.  Crescendo systolic murmur is present with a grade of 2/6  Pulmonary/Chest: Effort normal and breath sounds normal. No respiratory distress. Douglas Parker has no wheezes. Douglas Parker has no rales. Douglas Parker exhibits no tenderness.  Abdominal: Soft. Bowel sounds are normal. Douglas Parker exhibits no distension. There is no tenderness.  Musculoskeletal: Normal range of motion. Douglas Parker exhibits no edema and no tenderness.  Lymphadenopathy:    Douglas Parker has no cervical adenopathy.  Neurological: Douglas Parker is alert and oriented to person, place, and time. Coordination normal.  Skin: Skin is warm and dry. No rash noted. No erythema.  Psychiatric: Douglas Parker has a normal mood and affect. His behavior is normal. Judgment and thought content normal.           Assessment and Plan

## 2011-12-21 ENCOUNTER — Other Ambulatory Visit (INDEPENDENT_AMBULATORY_CARE_PROVIDER_SITE_OTHER): Payer: Medicare Other

## 2011-12-21 DIAGNOSIS — Z125 Encounter for screening for malignant neoplasm of prostate: Secondary | ICD-10-CM

## 2011-12-21 DIAGNOSIS — N400 Enlarged prostate without lower urinary tract symptoms: Secondary | ICD-10-CM

## 2011-12-21 DIAGNOSIS — I10 Essential (primary) hypertension: Secondary | ICD-10-CM

## 2011-12-21 LAB — COMPREHENSIVE METABOLIC PANEL
CO2: 30 mEq/L (ref 19–32)
Calcium: 8.9 mg/dL (ref 8.4–10.5)
Chloride: 98 mEq/L (ref 96–112)
Creatinine, Ser: 0.9 mg/dL (ref 0.4–1.5)
GFR: 83.56 mL/min (ref >60.00)
Glucose, Bld: 88 mg/dL (ref 70–99)
Sodium: 134 mEq/L — ABNORMAL LOW (ref 135–145)
Total Bilirubin: 1 mg/dL (ref 0.3–1.2)
Total Protein: 6.2 g/dL (ref 6.0–8.3)

## 2011-12-21 LAB — LIPID PANEL
HDL: 63.2 mg/dL (ref >39.00)
Triglycerides: 36 mg/dL (ref 0.0–149.0)

## 2011-12-21 LAB — PSA, MEDICARE: PSA: 0.16 ng/ml (ref 0.10–4.00)

## 2011-12-21 NOTE — Addendum Note (Signed)
Addended by: Baldomero Lamy on: 12/21/2011 09:11 AM   Modules accepted: Orders

## 2011-12-28 ENCOUNTER — Ambulatory Visit (INDEPENDENT_AMBULATORY_CARE_PROVIDER_SITE_OTHER): Payer: Medicare Other | Admitting: Family Medicine

## 2011-12-28 ENCOUNTER — Encounter: Payer: Self-pay | Admitting: Family Medicine

## 2011-12-28 VITALS — BP 132/60 | HR 60 | Temp 97.6°F | Wt 156.0 lb

## 2011-12-28 DIAGNOSIS — Z7189 Other specified counseling: Secondary | ICD-10-CM

## 2011-12-28 DIAGNOSIS — I359 Nonrheumatic aortic valve disorder, unspecified: Secondary | ICD-10-CM

## 2011-12-28 DIAGNOSIS — I10 Essential (primary) hypertension: Secondary | ICD-10-CM

## 2011-12-28 DIAGNOSIS — M4120 Other idiopathic scoliosis, site unspecified: Secondary | ICD-10-CM

## 2011-12-28 DIAGNOSIS — E785 Hyperlipidemia, unspecified: Secondary | ICD-10-CM

## 2011-12-28 DIAGNOSIS — I350 Nonrheumatic aortic (valve) stenosis: Secondary | ICD-10-CM

## 2011-12-28 DIAGNOSIS — Z23 Encounter for immunization: Secondary | ICD-10-CM

## 2011-12-28 DIAGNOSIS — N400 Enlarged prostate without lower urinary tract symptoms: Secondary | ICD-10-CM

## 2011-12-28 NOTE — Progress Notes (Signed)
Hypertension:    Using medication without problems or lightheadedness: yes Chest pain with exertion:no Edema:no Short of breath:no  Elevated Cholesterol: Using medications without problems:yes Muscle aches: no Diet compliance:yes Exercise:yes  Mild AS by last echo.  Not sob, no BLE edema.  D/w pt about prev echo and current exam  BPH.  No ADE from meds.  Stream is good. PSA stable.    Meds, vitals, and allergies reviewed.   PMH and SH reviewed  ROS: See HPI.  Otherwise negative.    GEN: nad, alert and oriented HEENT: mucous membranes moist NECK: supple w/o LA CV: rrr. Murmur noted, radiates to carotids.  PULM: ctab, no inc wob ABD: soft, +bs EXT: no edema SKIN: no acute rash  Labs d/w pt.

## 2011-12-28 NOTE — Assessment & Plan Note (Signed)
Controlled, continue current meds. Labs d/w pt.  

## 2011-12-28 NOTE — Assessment & Plan Note (Signed)
No sx, if he has SOB/BLE edema then he'll notify me.  Will follow clinically.

## 2011-12-28 NOTE — Patient Instructions (Addendum)
Don't change your meds for now.  Talk with the pharmacy about the lipitor cost.  If we need to, we can change that medicine.  Recheck in 1 year- schedule a physical with labs ahead of time. Take care.  Check with your insurance to see if they will cover the shingles shot.

## 2011-12-28 NOTE — Assessment & Plan Note (Signed)
Controlled, continue current meds.  He'll notify me if we need to change to simvastatin for cost concerns.  Labs d/w pt.

## 2011-12-28 NOTE — Assessment & Plan Note (Signed)
Controlled, doing well.  No sig sx.  PSA wnl.  Recheck in 1 year.

## 2011-12-28 NOTE — Assessment & Plan Note (Signed)
Noted on exam, no intervention.

## 2011-12-28 NOTE — Assessment & Plan Note (Signed)
Pt would want sone Baird Lyons designated if he were incapacitated.  He has a living will and can drop off a copy for scanning.

## 2012-01-09 ENCOUNTER — Other Ambulatory Visit: Payer: Self-pay | Admitting: *Deleted

## 2012-01-09 MED ORDER — LISINOPRIL-HYDROCHLOROTHIAZIDE 20-12.5 MG PO TABS: 1.0000 | ORAL_TABLET | Freq: Every day | ORAL | Status: DC

## 2012-01-09 MED ORDER — FINASTERIDE 5 MG PO TABS: 5.0000 mg | ORAL_TABLET | Freq: Every day | ORAL | Status: DC

## 2012-01-10 ENCOUNTER — Ambulatory Visit (INDEPENDENT_AMBULATORY_CARE_PROVIDER_SITE_OTHER): Payer: Medicare Other | Admitting: Family Medicine

## 2012-01-10 ENCOUNTER — Encounter: Payer: Self-pay | Admitting: Family Medicine

## 2012-01-10 VITALS — BP 140/70 | HR 73 | Temp 98.2°F | Wt 155.0 lb

## 2012-01-10 DIAGNOSIS — J069 Acute upper respiratory infection, unspecified: Secondary | ICD-10-CM | POA: Insufficient documentation

## 2012-01-10 MED ORDER — FLUTICASONE PROPIONATE 50 MCG/ACT NA SUSP: NASAL | Status: DC

## 2012-01-10 MED ORDER — AZITHROMYCIN 250 MG PO TABS: ORAL_TABLET | ORAL | Status: DC

## 2012-01-10 NOTE — Assessment & Plan Note (Signed)
Likely viral.  Nontoxic. Ctab.  Okay for outpatient f/u.  Hold zmax, use if sx >1 week.  flonase and supportive tx in meantime.  He agrees.  F/u prn.

## 2012-01-10 NOTE — Patient Instructions (Signed)
Use the flonase and salt water gargles.  If not improved by next week, then start the antibiotics.  Take care.  Glad to see you.

## 2012-01-10 NOTE — Progress Notes (Signed)
duration of symptoms: 2 days Rhinorrhea:yes congestion:yes ear pain: no sore throat: mildly sore cough:yes Myalgias: no other concerns: postnasal gtt and headache episodically Felt like he had a fever last night.   Not typically with allergies this time of year.    ROS: See HPI.  Otherwise negative.    Meds, vitals, and allergies reviewed.   GEN: nad, alert and oriented HEENT: mucous membranes moist, TM w/o erythema, nasal epithelium injected, OP with cobblestoning, sinuses not ttp NECK: supple w/o LA CV: rrr. Murmur noted PULM: ctab, no inc wob ABD: soft, +bs EXT: no edema Scoliosis noted

## 2012-06-28 DIAGNOSIS — Z961 Presence of intraocular lens: Secondary | ICD-10-CM | POA: Insufficient documentation

## 2012-09-10 ENCOUNTER — Ambulatory Visit (INDEPENDENT_AMBULATORY_CARE_PROVIDER_SITE_OTHER): Payer: Medicare Other | Admitting: Cardiovascular Disease

## 2012-09-10 ENCOUNTER — Encounter: Payer: Self-pay | Admitting: Cardiovascular Disease

## 2012-09-10 VITALS — BP 126/65 | HR 73 | Ht 70.0 in | Wt 146.8 lb

## 2012-09-10 DIAGNOSIS — I359 Nonrheumatic aortic valve disorder, unspecified: Secondary | ICD-10-CM

## 2012-09-10 DIAGNOSIS — E785 Hyperlipidemia, unspecified: Secondary | ICD-10-CM

## 2012-09-10 DIAGNOSIS — I350 Nonrheumatic aortic (valve) stenosis: Secondary | ICD-10-CM

## 2012-09-10 DIAGNOSIS — R011 Cardiac murmur, unspecified: Secondary | ICD-10-CM

## 2012-09-10 DIAGNOSIS — I10 Essential (primary) hypertension: Secondary | ICD-10-CM

## 2012-09-10 HISTORY — DX: Nonrheumatic aortic (valve) stenosis: I35.0

## 2012-09-10 NOTE — Assessment & Plan Note (Signed)
Cholesterol is at goal on the current lipid regimen. No changes to the medications were made.  

## 2012-09-10 NOTE — Assessment & Plan Note (Signed)
Blood pressure is well controlled on today's visit. No changes made to the medications. 

## 2012-09-10 NOTE — Progress Notes (Signed)
Patient ID: Douglas Parker, male    DOB: Feb 12, 1928, 79 y.o.   MRN: [id number]  HPI Comments: Douglas Parker is a 79 year old gentleman with no known coronary artery disease, history of hypertension, hyperlipidemia, chronic mild shortness of breath,  aortic valve sclerosis who presents for routine followup. He has severe scoliosis.    He states that overall he has been doing well. He denies any significant shortness of breath. He is active, with no symptoms of chest pain, lightheadedness or dizziness. He denies any significant lower extremity edema. He is doing well on Lipitor. He takes 20 mg per day . Presentation is similar to his prior visit last year. He is bothered more by his back, severe scoliosis. Some mild shortness of breath from this  Total cholesterol 154, LDL 84   Echocardiogram from April 2010 showed normal systolic function, minimal aortic valve stenosis, mildly dilated left atrium, mild TR. Carotid arterial Doppler done last year showed mild plaquing less than 39% bilaterally. Last stress test was 3 years ago with no ischemia. He exercised for 5 minutes, 30 seconds. Achieved 13 METS  EKG shows normal sinus rhythm with rate 73 beats per minute, no significant ST or T wave changes, PVCs in trigeminal pattern    Outpatient Encounter Prescriptions as of 09/10/2012  Medication Sig Dispense Refill  . aspirin 81 MG EC tablet Take 81 mg by mouth daily.        Marland Kitchen atorvastatin (LIPITOR) 40 MG tablet Take 0.5 tablet by mouth at bedtime      . finasteride (PROSCAR) 5 MG tablet Take 1 tablet (5 mg total) by mouth daily.  30 tablet  11  . lisinopril-hydrochlorothiazide (ZESTORETIC) 20-12.5 MG per tablet Take 1 tablet by mouth daily.  30 tablet  11  . Multiple Vitamin (MULTIVITAMIN) tablet Take 1 tablet by mouth daily.        . multivitamin-lutein (OCUVITE-LUTEIN) CAPS Take 1 capsule by mouth daily.        . Omega-3 Fatty Acids (FISH OIL) 1000 MG CAPS Take by mouth daily.        .  TRAVATAN Z 0.004 % SOLN ophthalmic solution Place 1 drop into both eyes at bedtime.       . vitamin C (ASCORBIC ACID) 500 MG tablet Take 500 mg by mouth daily.        . vitamin E 400 UNIT capsule Take 400 Units by mouth daily.        . [DISCONTINUED] azithromycin (ZITHROMAX) 250 MG tablet 2 tabs for 1 day and then 1 a day for 4 days.  6 tablet  0  . [DISCONTINUED] fluticasone (FLONASE) 50 MCG/ACT nasal spray 2 sprays in each nostril daily  16 g  1   No facility-administered encounter medications on file as of 09/10/2012.    Review of Systems  Constitutional: Negative.   HENT: Negative.   Eyes: Negative.   Respiratory: Positive for shortness of breath.   Cardiovascular: Negative.   Gastrointestinal: Negative.   Musculoskeletal: Positive for back pain.  Skin: Negative.   Neurological: Negative.   Psychiatric/Behavioral: Negative.   All other systems reviewed and are negative.    BP 126/65  Pulse 73  Ht 5\' 10"  (1.778 m)  Wt 146 lb 12 oz (66.565 kg)  BMI 21.06 kg/m2  Physical Exam  Nursing note and vitals reviewed. Constitutional: He is oriented to person, place, and time. He appears well-developed and well-nourished.  Severe curvature of his back from scoliosis  HENT:  Head: Normocephalic.  Nose: Nose normal.  Mouth/Throat: Oropharynx is clear and moist.  Eyes: Conjunctivae are normal. Pupils are equal, round, and reactive to light.  Neck: Normal range of motion. Neck supple. No JVD present. Carotid bruit is present.  Cardiovascular: Normal rate, regular rhythm, S1 normal, S2 normal and intact distal pulses.  Exam reveals no gallop and no friction rub.   Murmur heard.  Crescendo systolic murmur is present with a grade of 2/6  Pulmonary/Chest: Effort normal and breath sounds normal. No respiratory distress. He has no wheezes. He has no rales. He exhibits no tenderness.  Abdominal: Soft. Bowel sounds are normal. He exhibits no distension. There is no tenderness.   Musculoskeletal: Normal range of motion. He exhibits no edema and no tenderness.  Lymphadenopathy:    He has no cervical adenopathy.  Neurological: He is alert and oriented to person, place, and time. Coordination normal.  Skin: Skin is warm and dry. No rash noted. No erythema.  Psychiatric: He has a normal mood and affect. His behavior is normal. Judgment and thought content normal.      Assessment and Plan

## 2012-09-10 NOTE — Patient Instructions (Addendum)
You are doing well. No medication changes were made.  We will order an echocardiogram for murmur and hx of aortic valve stenosis  Please call us if you have new issues that need to be addressed before your next appt.  Your physician wants you to follow-up in: 12 months.  You will receive a reminder letter in the mail two months in advance. If you don't receive a letter, please call our office to schedule the follow-up appointment.

## 2012-09-10 NOTE — Assessment & Plan Note (Signed)
We have ordered an echocardiogram. Murmur is more prominent on today's exam. Mild shortness of breath. Uncertain if this is from scoliosis or aortic valve stenosis.

## 2012-10-01 ENCOUNTER — Other Ambulatory Visit (INDEPENDENT_AMBULATORY_CARE_PROVIDER_SITE_OTHER): Payer: Medicare Other

## 2012-10-01 ENCOUNTER — Other Ambulatory Visit: Payer: Self-pay

## 2012-10-01 DIAGNOSIS — I359 Nonrheumatic aortic valve disorder, unspecified: Secondary | ICD-10-CM

## 2012-10-01 DIAGNOSIS — I350 Nonrheumatic aortic (valve) stenosis: Secondary | ICD-10-CM

## 2012-10-01 DIAGNOSIS — R011 Cardiac murmur, unspecified: Secondary | ICD-10-CM

## 2012-10-29 ENCOUNTER — Ambulatory Visit: Payer: Medicare Other | Admitting: Cardiovascular Disease

## 2012-10-31 ENCOUNTER — Ambulatory Visit (INDEPENDENT_AMBULATORY_CARE_PROVIDER_SITE_OTHER): Payer: Medicare Other | Admitting: Cardiovascular Disease

## 2012-10-31 ENCOUNTER — Encounter: Payer: Self-pay | Admitting: Cardiovascular Disease

## 2012-10-31 VITALS — BP 122/60 | HR 68 | Ht 70.0 in | Wt 144.5 lb

## 2012-10-31 DIAGNOSIS — I10 Essential (primary) hypertension: Secondary | ICD-10-CM

## 2012-10-31 DIAGNOSIS — I359 Nonrheumatic aortic valve disorder, unspecified: Secondary | ICD-10-CM

## 2012-10-31 DIAGNOSIS — E785 Hyperlipidemia, unspecified: Secondary | ICD-10-CM

## 2012-10-31 DIAGNOSIS — I350 Nonrheumatic aortic (valve) stenosis: Secondary | ICD-10-CM

## 2012-10-31 DIAGNOSIS — R0602 Shortness of breath: Secondary | ICD-10-CM

## 2012-10-31 NOTE — Progress Notes (Signed)
Patient ID: Douglas Parker, male    DOB: 1927-08-10, 79 y.o.   MRN: [id number]  HPI Comments: Douglas Parker is a 79 year old gentleman with no known coronary artery disease, history of hypertension, hyperlipidemia, chronic mild shortness of breath,  aortic valve sclerosis who presents for routine followup. He has severe scoliosis.    He presents for followup after his recent echocardiogram. He does report increasing shortness of breath with activities. He is still able to do low-grade work around the house. Cannot do what he used to do.  Echocardiogram showed severe aortic valve stenosis, severe calcification of the valve with restriction of the leaflets. Mean gradient of 41 mm of mercury, peak gradient 80 mm of mercury  He is uncertain if his shortness of breath is from his aortic valve or scoliosis. He is interested in having further workup if needed, particularly if his valve is going to get worse  Previous Total cholesterol 154, LDL 84   Echocardiogram from April 2010 showed normal systolic function, minimal aortic valve stenosis, mildly dilated left atrium, mild TR. Carotid arterial Doppler done last year showed mild plaquing less than 39% bilaterally. Last stress test was 3 years ago with no ischemia. He exercised for 5 minutes, 30 seconds. Achieved 13 METS  No EKG performed today  Outpatient Encounter Prescriptions as of 10/31/2012  Medication Sig Dispense Refill  . aspirin 81 MG EC tablet Take 81 mg by mouth daily.        Marland Kitchen atorvastatin (LIPITOR) 40 MG tablet Take 0.5 tablet by mouth at bedtime      . finasteride (PROSCAR) 5 MG tablet Take 1 tablet (5 mg total) by mouth daily.  30 tablet  11  . lisinopril-hydrochlorothiazide (ZESTORETIC) 20-12.5 MG per tablet Take 1 tablet by mouth daily.  30 tablet  11  . Multiple Vitamin (MULTIVITAMIN) tablet Take 1 tablet by mouth daily.        . multivitamin-lutein (OCUVITE-LUTEIN) CAPS Take 1 capsule by mouth daily.        . Omega-3  Fatty Acids (FISH OIL) 1000 MG CAPS Take by mouth daily.        . TRAVATAN Z 0.004 % SOLN ophthalmic solution Place 1 drop into both eyes at bedtime.       . vitamin C (ASCORBIC ACID) 500 MG tablet Take 500 mg by mouth daily.        . vitamin E 400 UNIT capsule Take 400 Units by mouth daily.         No facility-administered encounter medications on file as of 10/31/2012.    Review of Systems  Constitutional: Negative.   HENT: Negative.   Eyes: Negative.   Respiratory: Positive for shortness of breath.   Cardiovascular: Negative.   Gastrointestinal: Negative.   Musculoskeletal: Positive for back pain.  Skin: Negative.   Neurological: Negative.   Psychiatric/Behavioral: Negative.   All other systems reviewed and are negative.    BP 122/60  Pulse 68  Ht 5\' 10"  (1.778 m)  Wt 144 lb 8 oz (65.545 kg)  BMI 20.73 kg/m2  Physical Exam  Nursing note and vitals reviewed. Constitutional: He is oriented to person, place, and time. He appears well-developed and well-nourished.  Severe curvature of his back from scoliosis  HENT:  Head: Normocephalic.  Nose: Nose normal.  Mouth/Throat: Oropharynx is clear and moist.  Eyes: Conjunctivae are normal. Pupils are equal, round, and reactive to light.  Neck: Normal range of motion. Neck supple. No JVD present. Carotid bruit  is present.  Cardiovascular: Normal rate, regular rhythm, S1 normal, S2 normal and intact distal pulses.  Exam reveals no gallop and no friction rub.   Murmur heard.  Crescendo systolic murmur is present with a grade of 3/6  Pulmonary/Chest: Effort normal and breath sounds normal. No respiratory distress. He has no wheezes. He has no rales. He exhibits no tenderness.  Abdominal: Soft. Bowel sounds are normal. He exhibits no distension. There is no tenderness.  Musculoskeletal: Normal range of motion. He exhibits no edema and no tenderness.  Lymphadenopathy:    He has no cervical adenopathy.  Neurological: He is alert and  oriented to person, place, and time. Coordination normal.  Skin: Skin is warm and dry. No rash noted. No erythema.  Psychiatric: He has a normal mood and affect. His behavior is normal. Judgment and thought content normal.      Assessment and Plan

## 2012-10-31 NOTE — Assessment & Plan Note (Signed)
Blood pressure is well controlled on today's visit. No changes made to the medications. 

## 2012-10-31 NOTE — Patient Instructions (Addendum)
You are doing well. No medication changes were made.  We will schedule a TEE to look at severe aortic valve stenosis.  TEE SCHEDULED FOR 12/19/12 AT [facility] .  PLEASE ARRIVE AT 6:30am AT THE MEDICAL MALL ENTRANCE.  NOTHING TO EAT OR DRINK AFTER MIDNIGHT THE NIGHT BEFORE.  YOU MAY TAKE MEDICATIONS WITH A SIP OF WATER. YOU WILL NEED SOMEONE TO DRIVE YOU HOME.  Please call us if you have new issues that need to be addressed before your next appt.  Your physician wants you to follow-up in: 6 months.  You will receive a reminder letter in the mail two months in advance. If you don't receive a letter, please call our office to schedule the follow-up appointment

## 2012-10-31 NOTE — Assessment & Plan Note (Signed)
dramatic progression in his aortic valve stenosis in the past one half years. Now severe with peak gradient 80 mm of mercury, mean gradient 40. We'll schedule him for TEE to confirm valve pathology. He is starting to have more shortness of breath, though still mildly active. Limited also by scoliosis.

## 2012-10-31 NOTE — Assessment & Plan Note (Signed)
Uncertain if shortness of breath is from scoliosis or worsening aortic stenosis. Likely combined etiology.

## 2012-10-31 NOTE — Assessment & Plan Note (Signed)
Cholesterol is at goal on the current lipid regimen. No changes to the medications were made.  

## 2012-12-10 ENCOUNTER — Telehealth: Payer: Self-pay

## 2012-12-10 NOTE — Telephone Encounter (Signed)
Pt is aware of new time for TEE.

## 2012-12-10 NOTE — Telephone Encounter (Signed)
Left message for pt to call back.   Need to move TEE on 10/2.   Pt will need to be at hospital 7:30 instead of 6:30 on that day.

## 2012-12-17 DIAGNOSIS — H401190 Primary open-angle glaucoma, unspecified eye, stage unspecified: Secondary | ICD-10-CM | POA: Insufficient documentation

## 2012-12-19 ENCOUNTER — Ambulatory Visit: Payer: Self-pay | Admitting: Cardiovascular Disease

## 2012-12-19 DIAGNOSIS — I359 Nonrheumatic aortic valve disorder, unspecified: Secondary | ICD-10-CM

## 2012-12-20 ENCOUNTER — Encounter: Payer: Self-pay | Admitting: Cardiovascular Disease

## 2012-12-25 ENCOUNTER — Ambulatory Visit (INDEPENDENT_AMBULATORY_CARE_PROVIDER_SITE_OTHER): Payer: Medicare Other

## 2012-12-25 DIAGNOSIS — Z23 Encounter for immunization: Secondary | ICD-10-CM

## 2013-01-02 ENCOUNTER — Telehealth: Payer: Self-pay

## 2013-01-02 NOTE — Telephone Encounter (Signed)
Pt would like TEE results.  

## 2013-01-05 NOTE — Telephone Encounter (Signed)
We may have talked about results after procedure His aortic valve has moderate to severe stenosis. Could monitor for now with repeat echo in office next yewar If shortness of breath gets worse, may need to proceed to referral to CT surgery for new valve (will needed to be done at some point in next few years)

## 2013-01-06 NOTE — Telephone Encounter (Signed)
Spoke w/ pt. He is aware of results and will call if symptoms continue or worsen.

## 2013-01-07 ENCOUNTER — Other Ambulatory Visit: Payer: Self-pay | Admitting: Family Medicine

## 2013-01-07 NOTE — Telephone Encounter (Signed)
Electronic refill request. Patient has not been seen in 1 year and no upcoming appts scheduled.  Please advise.

## 2013-01-08 NOTE — Telephone Encounter (Signed)
Sent, due for CPE.  Please schedule.  Thanks.

## 2013-01-08 NOTE — Telephone Encounter (Signed)
Patient notified as instructed by telephone. Patient transferred to scheduler to set CPE up.

## 2013-01-10 ENCOUNTER — Other Ambulatory Visit: Payer: Self-pay | Admitting: *Deleted

## 2013-01-10 MED ORDER — LISINOPRIL-HYDROCHLOROTHIAZIDE 20-12.5 MG PO TABS: 1.0000 | ORAL_TABLET | Freq: Every day | ORAL | Status: DC

## 2013-02-16 ENCOUNTER — Other Ambulatory Visit: Payer: Self-pay | Admitting: Family Medicine

## 2013-02-16 DIAGNOSIS — I10 Essential (primary) hypertension: Secondary | ICD-10-CM

## 2013-02-27 ENCOUNTER — Other Ambulatory Visit (INDEPENDENT_AMBULATORY_CARE_PROVIDER_SITE_OTHER): Payer: Medicare Other

## 2013-02-27 DIAGNOSIS — I10 Essential (primary) hypertension: Secondary | ICD-10-CM

## 2013-02-27 LAB — COMPREHENSIVE METABOLIC PANEL
CO2: 32 mEq/L (ref 19–32)
Calcium: 9.2 mg/dL (ref 8.4–10.5)
Chloride: 96 mEq/L (ref 96–112)
Creatinine, Ser: 0.9 mg/dL (ref 0.4–1.5)
GFR: 81.28 mL/min (ref >60.00)
Glucose, Bld: 91 mg/dL (ref 70–99)
Sodium: 134 mEq/L — ABNORMAL LOW (ref 135–145)
Total Bilirubin: 0.9 mg/dL (ref 0.3–1.2)
Total Protein: 6.9 g/dL (ref 6.0–8.3)

## 2013-02-27 LAB — LIPID PANEL
Cholesterol: 170 mg/dL (ref 0–200)
VLDL: 8.4 mg/dL (ref 0.0–40.0)

## 2013-03-06 ENCOUNTER — Ambulatory Visit: Admission: RE | Admit: 2013-03-06 | Payer: Medicare Other | Source: Ambulatory Visit

## 2013-03-06 ENCOUNTER — Encounter: Payer: Self-pay | Admitting: Family Medicine

## 2013-03-06 ENCOUNTER — Ambulatory Visit (INDEPENDENT_AMBULATORY_CARE_PROVIDER_SITE_OTHER)
Admission: RE | Admit: 2013-03-06 | Discharge: 2013-03-06 | Disposition: A | Discharge status: Home / Self Care | Payer: Medicare Other | Source: Ambulatory Visit | Attending: Family Medicine | Admitting: Family Medicine

## 2013-03-06 ENCOUNTER — Ambulatory Visit (INDEPENDENT_AMBULATORY_CARE_PROVIDER_SITE_OTHER): Payer: Medicare Other | Admitting: Family Medicine

## 2013-03-06 VITALS — BP 102/50 | HR 72 | Temp 97.3°F | Ht 70.0 in | Wt 146.5 lb

## 2013-03-06 DIAGNOSIS — E785 Hyperlipidemia, unspecified: Secondary | ICD-10-CM

## 2013-03-06 DIAGNOSIS — M4120 Other idiopathic scoliosis, site unspecified: Secondary | ICD-10-CM

## 2013-03-06 DIAGNOSIS — Z0000 Encounter for general adult medical examination without abnormal findings: Secondary | ICD-10-CM | POA: Insufficient documentation

## 2013-03-06 DIAGNOSIS — I10 Essential (primary) hypertension: Secondary | ICD-10-CM

## 2013-03-06 MED ORDER — ATORVASTATIN CALCIUM 20 MG PO TABS: 20.0000 mg | ORAL_TABLET | Freq: Every day | ORAL | Status: DC

## 2013-03-06 MED ORDER — ACETAMINOPHEN 500 MG PO TABS: 1000.0000 mg | ORAL_TABLET | Freq: Three times a day (TID) | ORAL | Status: DC | PRN

## 2013-03-06 MED ORDER — TRAMADOL HCL 50 MG PO TABS: 50.0000 mg | ORAL_TABLET | Freq: Three times a day (TID) | ORAL | Status: DC | PRN

## 2013-03-06 NOTE — Progress Notes (Signed)
Pre-visit discussion using our clinic review tool. No additional management support is needed unless otherwise documented below in the visit note.  

## 2013-03-06 NOTE — Patient Instructions (Addendum)
Go to the lab on the way out.  We'll contact you with your xray report. Douglas Parker will call about your referral. Check with your insurance to see if they will cover the shingles and tetanus shot. Take tramadol for pain if the tylenol doesn't help.  Take care.  Glad to see you.

## 2013-03-06 NOTE — Assessment & Plan Note (Signed)
Refer to neurosurgery clinic for eval. Tylenol then tramadol prn for pain. He agrees.

## 2013-03-06 NOTE — Assessment & Plan Note (Signed)
Controlled, continue current meds.   

## 2013-03-06 NOTE — Progress Notes (Signed)
I have personally reviewed the Medicare Annual Wellness questionnaire and have noted 1. The patient's medical and social history 2. Their use of alcohol, tobacco or illicit drugs 3. Their current medications and supplements 4. The patient's functional ability including ADL's, fall risks, home safety risks and hearing or visual             impairment. 5. Diet and physical activities 6. Evidence for depression or mood disorders  The patients weight, height, BMI have been recorded in the chart and visual acuity is per eye clinic.  I have made referrals, counseling and provided education to the patient based review of the above and I have provided the pt with a written personalized care plan for preventive services.  See scanned forms.  Routine anticipatory guidance given to patient.  See health maintenance. Flu prev done this year.  Shingles d/w pt PNA 1999 Tetanus d/w pt.  Colon CA screening not indicated given age.  He agrees. No FH.   Prostate cancer screening and PSA options (with potential risks and benefits of testing vs not testing) were discussed along with recent recs/guidelines.  He declined testing PSA at this point. Advance directive d/w pt. Son Baird Lyons designated if pt is incapacitated.  Cognitive function addressed- see scanned forms- and if abnormal then additional documentation follows.   Hypertension:    Using medication without problems or lightheadedness: yes Chest pain with exertion:no Edema:no Short of breath:no  Elevated Cholesterol: Using medications without problems:yes Muscle aches: no Diet compliance:yes Exercise:yes  Scoliosis is progressive and asking for referral.  Inc in pain gradually.  He had wanted to avoid pain meds if possible.  Sitting helps the pain.  Pain with walking.    PMH and SH reviewed  Meds, vitals, and allergies reviewed.   ROS: See HPI.  Otherwise negative.    GEN: nad, alert and oriented HEENT: mucous membranes moist NECK: supple  w/o LA CV: rrr. SEM noted, at baseline.  PULM: ctab, no inc wob ABD: soft, +bs EXT: no edema SKIN: no acute rash B dupuytren's noted on the hands.   Scoliotic changes noted on the back

## 2013-03-06 NOTE — Assessment & Plan Note (Signed)
See scanned forms.  Routine anticipatory guidance given to patient.  See health maintenance. Flu prev done this year.  Shingles d/w pt PNA 1999 Tetanus d/w pt.  Colon CA screening not indicated given age.  He agrees. No FH.   Prostate cancer screening and PSA options (with potential risks and benefits of testing vs not testing) were discussed along with recent recs/guidelines.  He declined testing PSA at this point. Advance directive d/w pt. Son Baird Lyons designated if pt is incapacitated.  Cognitive function addressed- see scanned forms- and if abnormal then additional documentation follows.

## 2013-04-09 DIAGNOSIS — H179 Unspecified corneal scar and opacity: Secondary | ICD-10-CM | POA: Insufficient documentation

## 2013-05-02 ENCOUNTER — Encounter: Payer: Self-pay | Admitting: Cardiovascular Disease

## 2013-05-02 ENCOUNTER — Ambulatory Visit (INDEPENDENT_AMBULATORY_CARE_PROVIDER_SITE_OTHER): Payer: Medicare Other | Admitting: Cardiovascular Disease

## 2013-05-02 VITALS — BP 143/64 | HR 69 | Ht 70.0 in | Wt 147.8 lb

## 2013-05-02 DIAGNOSIS — I350 Nonrheumatic aortic (valve) stenosis: Secondary | ICD-10-CM

## 2013-05-02 DIAGNOSIS — I10 Essential (primary) hypertension: Secondary | ICD-10-CM

## 2013-05-02 DIAGNOSIS — R0602 Shortness of breath: Secondary | ICD-10-CM

## 2013-05-02 DIAGNOSIS — I359 Nonrheumatic aortic valve disorder, unspecified: Secondary | ICD-10-CM

## 2013-05-02 DIAGNOSIS — E785 Hyperlipidemia, unspecified: Secondary | ICD-10-CM

## 2013-05-02 NOTE — Progress Notes (Signed)
Patient ID: Douglas Parker, male    DOB: 11/21/1927, 80 y.o.   MRN: [id number]  HPI Comments: [name] is a 80 year old gentleman with no known coronary artery disease, history of hypertension, hyperlipidemia, chronic mild shortness of breath,  aortic valve sclerosis who presents for routine followup. He has severe scoliosis.  TEE performed October 2014 showing moderate to severe aortic valve stenosis, estimated aortic valve area 1.2 cm   In general he reports that he is doing well. He does have occasional shortness of breath when he lifts heavy items such as dogfood bags and carried him into the house. He continues to play golf on a regular basis. His game is not as good as it used to be. He continues to have periodic PVCs and is relatively asymptomatic apart from occasional pulsation in his ear when he sleeps. Mild chronic shortness of breath with activities. He is still able to do low-grade work around the house. Cannot do what he used to do.  Echocardiogram showed severe aortic valve stenosis, severe calcification of the valve with restriction of the leaflets. Mean gradient of 41 mm of mercury, peak gradient 80 mm of mercury  Previous Total cholesterol 154, LDL 84   Echocardiogram from April 2010 showed normal systolic function, minimal aortic valve stenosis, mildly dilated left atrium, mild TR. Carotid arterial Doppler done last year showed mild plaquing less than 39% bilaterally. Last stress test was 3 years ago with no ischemia. He exercised for 5 minutes, 30 seconds. Achieved 13 METS  EKG shows normal sinus rhythm with PVC and trigeminal pattern  Outpatient Encounter Prescriptions as of 05/02/2013  Medication Sig  . acetaminophen (TYLENOL) 500 MG tablet Take 2 tablets (1,000 mg total) by mouth every 8 (eight) hours as needed (pain).  Marland Kitchen aspirin 81 MG EC tablet Take 81 mg by mouth daily.    Marland Kitchen atorvastatin (LIPITOR) 20 MG tablet Take 1 tablet (20 mg total) by mouth at  bedtime.  . finasteride (PROSCAR) 5 MG tablet TAKE ONE TABLET BY MOUTH EVERY DAY  . lisinopril-hydrochlorothiazide (ZESTORETIC) 20-12.5 MG per tablet Take 1 tablet by mouth daily.  . Multiple Vitamin (MULTIVITAMIN) tablet Take 1 tablet by mouth daily.    . multivitamin-lutein (OCUVITE-LUTEIN) CAPS Take 1 capsule by mouth daily.    . Omega-3 Fatty Acids (FISH OIL) 1000 MG CAPS Take by mouth daily.    . traMADol (ULTRAM) 50 MG tablet Take 1 tablet (50 mg total) by mouth every 8 (eight) hours as needed (for pain.  sedation caution).  . TRAVATAN Z 0.004 % SOLN ophthalmic solution Place 1 drop into the right eye at bedtime.   . vitamin C (ASCORBIC ACID) 500 MG tablet Take 500 mg by mouth daily.    . vitamin E 400 UNIT capsule Take 400 Units by mouth daily.      Review of Systems  Constitutional: Negative.   HENT: Negative.   Eyes: Negative.   Respiratory: Positive for shortness of breath.   Cardiovascular: Negative.   Gastrointestinal: Negative.   Endocrine: Negative.   Musculoskeletal: Positive for back pain.  Skin: Negative.   Allergic/Immunologic: Negative.   Neurological: Negative.   Hematological: Negative.   Psychiatric/Behavioral: Negative.   All other systems reviewed and are negative.    Ht 5\' 10"  (1.778 m)  Wt 147 lb 12 oz (67.019 kg)  BMI 21.20 kg/m2  Physical Exam  Nursing note and vitals reviewed. Constitutional: He is oriented to person, place, and time. He appears well-developed and  well-nourished.  Severe curvature of his back from scoliosis  HENT:  Head: Normocephalic.  Nose: Nose normal.  Mouth/Throat: Oropharynx is clear and moist.  Eyes: Conjunctivae are normal. Pupils are equal, round, and reactive to light.  Neck: Normal range of motion. Neck supple. No JVD present. Carotid bruit is present.  Cardiovascular: Normal rate, regular rhythm, S1 normal, S2 normal and intact distal pulses.  Exam reveals no gallop and no friction rub.   Murmur heard.  Crescendo  systolic murmur is present with a grade of 3/6  Pulmonary/Chest: Effort normal and breath sounds normal. No respiratory distress. He has no wheezes. He has no rales. He exhibits no tenderness.  Abdominal: Soft. Bowel sounds are normal. He exhibits no distension. There is no tenderness.  Musculoskeletal: Normal range of motion. He exhibits no edema and no tenderness.  Lymphadenopathy:    He has no cervical adenopathy.  Neurological: He is alert and oriented to person, place, and time. Coordination normal.  Skin: Skin is warm and dry. No rash noted. No erythema.  Psychiatric: He has a normal mood and affect. His behavior is normal. Judgment and thought content normal.      Assessment and Plan

## 2013-05-02 NOTE — Patient Instructions (Signed)
You are doing well. No medication changes were made.  We will schedule a echocardiogram for September for aortic valve stenosis  Please call us if you have new issues that need to be addressed before your next appt.  Your physician wants you to follow-up in: 6 months after stress test in September 2015  You will receive a reminder letter in the mail two months in advance. If you don't receive a letter, please call our office to schedule the follow-up appointment.

## 2013-05-02 NOTE — Assessment & Plan Note (Signed)
Encouraged him to stay on his Lipitor 

## 2013-05-02 NOTE — Assessment & Plan Note (Signed)
Chronic mild shortness of breath with heavy exertion. Contribution from aortic valve stenosis and his scoliosis

## 2013-05-02 NOTE — Assessment & Plan Note (Signed)
Mild symptoms of shortness of breath with heavy exertion. Possibly some contribution from scoliosis, kyphosis in addition to his severe aortic valve stenosis. We have suggested repeat echocardiogram in 6 months time with followup in the office at that time. We did discuss arranging an evaluation with surgery for his aortic valve. This will be done in followup in the next visit

## 2013-05-08 ENCOUNTER — Other Ambulatory Visit: Payer: Self-pay | Admitting: Family Medicine

## 2013-06-25 ENCOUNTER — Ambulatory Visit (INDEPENDENT_AMBULATORY_CARE_PROVIDER_SITE_OTHER): Payer: Medicare Other | Admitting: Family Medicine

## 2013-06-25 ENCOUNTER — Ambulatory Visit (INDEPENDENT_AMBULATORY_CARE_PROVIDER_SITE_OTHER)
Admission: RE | Admit: 2013-06-25 | Discharge: 2013-06-25 | Disposition: A | Discharge status: Home / Self Care | Payer: Medicare Other | Source: Ambulatory Visit | Attending: Family Medicine | Admitting: Family Medicine

## 2013-06-25 ENCOUNTER — Encounter: Payer: Self-pay | Admitting: Family Medicine

## 2013-06-25 VITALS — BP 130/70 | HR 72 | Temp 98.1°F | Wt 138.5 lb

## 2013-06-25 DIAGNOSIS — M4120 Other idiopathic scoliosis, site unspecified: Secondary | ICD-10-CM

## 2013-06-25 DIAGNOSIS — R634 Abnormal weight loss: Secondary | ICD-10-CM

## 2013-06-25 NOTE — Progress Notes (Signed)
Pre visit review using our clinic review tool, if applicable. No additional management support is needed unless otherwise documented below in the visit note.  Weight and appetite are both down.  No fevers.  No vomiting, no diarrhea.  Occ nausea in AM.  No rash.  No blood in stool.  No med changes.  No cough.  No CP.  SOB is at baseline, known valvular hx noted.  He has been skipping lunch, but that isn't atypical for him.  No BLE edema. Not lightheaded on standing.    Tramadol helped a little with his back pain.  He saw Dr. Hal Neer but was waiting on a pain clinic referral.    Meds, vitals, and allergies reviewed.   ROS: See HPI.  Otherwise, noncontributory.  nad ncat Rrr, murmur noted ctab abd soft, not ttp Ext w/o edema Scoliosis noted

## 2013-06-25 NOTE — Patient Instructions (Signed)
Don't change your meds for now. Go to the lab on the way out.  We'll contact you with your lab and xray report. We'll go from there.   Take care.

## 2013-06-26 DIAGNOSIS — R634 Abnormal weight loss: Secondary | ICD-10-CM | POA: Insufficient documentation

## 2013-06-26 LAB — COMPREHENSIVE METABOLIC PANEL
ALK PHOS: 47 U/L (ref 39–117)
ALT: 24 U/L (ref 0–53)
AST: 21 U/L (ref 0–37)
Albumin: 3.8 g/dL (ref 3.5–5.2)
BUN: 25 mg/dL — AB (ref 6–23)
CO2: 33 mEq/L — ABNORMAL HIGH (ref 19–32)
CREATININE: 1 mg/dL (ref 0.4–1.5)
Calcium: 9.3 mg/dL (ref 8.4–10.5)
Chloride: 98 mEq/L (ref 96–112)
GFR: 76.5 mL/min (ref >60.00)
Glucose, Bld: 91 mg/dL (ref 70–99)
Potassium: 4.5 mEq/L (ref 3.5–5.1)
Sodium: 137 mEq/L (ref 135–145)
Total Bilirubin: 0.7 mg/dL (ref 0.3–1.2)
Total Protein: 6.6 g/dL (ref 6.0–8.3)

## 2013-06-26 LAB — CBC WITH DIFFERENTIAL/PLATELET
Basophils Absolute: 0 10*3/uL (ref 0.0–0.1)
Basophils Relative: 0.7 % (ref 0.0–3.0)
Eosinophils Absolute: 0.1 10*3/uL (ref 0.0–0.7)
Eosinophils Relative: 1.8 % (ref 0.0–5.0)
HEMATOCRIT: 36.3 % — AB (ref 39.0–52.0)
HEMOGLOBIN: 12.3 g/dL — AB (ref 13.0–17.0)
LYMPHS ABS: 1.1 10*3/uL (ref 0.7–4.0)
Lymphocytes Relative: 17.7 % (ref 12.0–46.0)
MCHC: 33.9 g/dL (ref 30.0–36.0)
MCV: 93.1 fl (ref 78.0–100.0)
MONO ABS: 0.4 10*3/uL (ref 0.1–1.0)
Monocytes Relative: 6.5 % (ref 3.0–12.0)
Neutro Abs: 4.4 10*3/uL (ref 1.4–7.7)
Neutrophils Relative %: 73.3 % (ref 43.0–77.0)
Platelets: 187 10*3/uL (ref 150.0–400.0)
RBC: 3.89 Mil/uL — ABNORMAL LOW (ref 4.22–5.81)
RDW: 12.8 % (ref 11.5–14.6)
WBC: 6 10*3/uL (ref 4.5–10.5)

## 2013-06-26 LAB — TSH: TSH: 0.5 u[IU]/mL (ref 0.35–5.50)

## 2013-06-26 NOTE — Assessment & Plan Note (Signed)
We'll see about getting an update on the status of his pain clinic referral.  He agrees.

## 2013-06-26 NOTE — Assessment & Plan Note (Signed)
No clear source, see notes on labs and cxr.  He'll try to up his calories in the meantime.

## 2013-07-01 ENCOUNTER — Ambulatory Visit (INDEPENDENT_AMBULATORY_CARE_PROVIDER_SITE_OTHER): Payer: Medicare Other | Admitting: Family Medicine

## 2013-07-01 ENCOUNTER — Encounter: Payer: Self-pay | Admitting: Family Medicine

## 2013-07-01 VITALS — BP 118/58 | HR 74 | Temp 98.0°F | Wt 139.5 lb

## 2013-07-01 DIAGNOSIS — R05 Cough: Secondary | ICD-10-CM

## 2013-07-01 DIAGNOSIS — R059 Cough, unspecified: Secondary | ICD-10-CM

## 2013-07-01 MED ORDER — BENZONATATE 200 MG PO CAPS: 200.0000 mg | ORAL_CAPSULE | Freq: Three times a day (TID) | ORAL | Status: DC | PRN

## 2013-07-01 MED ORDER — DOXYCYCLINE HYCLATE 100 MG PO TABS: 100.0000 mg | ORAL_TABLET | Freq: Two times a day (BID) | ORAL | Status: DC

## 2013-07-01 NOTE — Patient Instructions (Signed)
Start the doxycycline today and use the tessalon for the cough.  Take care.  If your weight isn't going up gradually, then let me know. Glad to see you.

## 2013-07-01 NOTE — Progress Notes (Signed)
Pre visit review using our clinic review tool, if applicable. No additional management support is needed unless otherwise documented below in the visit note.  His weight is up 1 lb.  He's working on upping his intake in the meantime.  Initial labs and CXR had no acute findings.  Known asbestos exposure with the [employer] prev.    He' still waiting to hear about pain clinic referral.  I told him we'd check on this.    Recently with cough.  Voice was altered this AM.  Voice was "gone" this AM, some better now.  Rhinorrhea.  Some sputum. No fevers.  No vomiting, no diarrhea.  No wheeze.   Meds, vitals, and allergies reviewed.   ROS: See HPI.  Otherwise, noncontributory.  GEN: nad, alert and oriented HEENT: mucous membranes moist, tm w/o erythema, nasal exam w/o erythema, clear discharge noted,  OP with cobblestoning NECK: supple w/o LA CV: rrr.   PULM: ctab, no inc wob, scoliotic changes noted, no wheeze EXT: no edema SKIN: no acute rash

## 2013-07-02 ENCOUNTER — Telehealth: Payer: Self-pay | Admitting: Family Medicine

## 2013-07-02 ENCOUNTER — Ambulatory Visit: Payer: Medicare Other | Admitting: Family Medicine

## 2013-07-02 DIAGNOSIS — R059 Cough, unspecified: Secondary | ICD-10-CM | POA: Insufficient documentation

## 2013-07-02 DIAGNOSIS — R05 Cough: Secondary | ICD-10-CM | POA: Insufficient documentation

## 2013-07-02 NOTE — Telephone Encounter (Signed)
Please call over to Dr. Sande Rives clinic and see about the status of the referral to the pain clinic.  Please notify pt.  Thanks.

## 2013-07-02 NOTE — Assessment & Plan Note (Signed)
Given his history, I would proceed with tx as a bronchitis.  He agrees.  Start doxy, use tessalon for cough and call back as needed.  Prev CXR d/w pt.  We'll inquire about the pain clinic referral status.  He is up 1 pound.  If he has anymore weight loss, that would trigger further w/u.  If weight increases, I'll defer.  He agrees.

## 2013-07-02 NOTE — Telephone Encounter (Signed)
Spoke to [place]and[place] at Dr[facility] clinic and was advised that she plans on calling the patient this afternoon and get the appointment scheduled. Patient advised by telephone.

## 2013-09-24 ENCOUNTER — Ambulatory Visit (INDEPENDENT_AMBULATORY_CARE_PROVIDER_SITE_OTHER): Payer: Medicare Other | Admitting: Family Medicine

## 2013-09-24 ENCOUNTER — Encounter: Payer: Self-pay | Admitting: Family Medicine

## 2013-09-24 VITALS — BP 130/70 | HR 80 | Temp 98.3°F | Wt 133.5 lb

## 2013-09-24 DIAGNOSIS — R634 Abnormal weight loss: Secondary | ICD-10-CM

## 2013-09-24 DIAGNOSIS — Z125 Encounter for screening for malignant neoplasm of prostate: Secondary | ICD-10-CM

## 2013-09-24 LAB — POCT URINALYSIS DIPSTICK
Bilirubin, UA: NEGATIVE
Blood, UA: NEGATIVE
Glucose, UA: NEGATIVE
Ketones, UA: NEGATIVE
Leukocytes, UA: NEGATIVE
Nitrite, UA: NEGATIVE
Protein, UA: NEGATIVE
Spec Grav, UA: 1.025
Urobilinogen, UA: 0.2
pH, UA: 6

## 2013-09-24 NOTE — Progress Notes (Signed)
Pre visit review using our clinic review tool, if applicable. No additional management support is needed unless otherwise documented below in the visit note.  Continues to have progressive weight loss, dec in appetite.  No vomiting but some nausea episodically.  No rash.  Diffuse aches, likely partly from his back- more pain standing or walking.  No ST, no ear ache, no rhinorrhea.  Prev with some HA, none now.    His valve hx is noted, unclear if that is contributing to weight loss.  He has no edema.   Meds, vitals, and allergies reviewed.   ROS: See HPI.  Otherwise, noncontributory.  nad Thin Mmm Neck supple, no LA rrr with SEM noted ctab Back with obvious scoliotic changes abd soft, not ttp Ext w/o edema

## 2013-09-24 NOTE — Patient Instructions (Signed)
Go to the lab on the way out.  We'll contact you with your lab report. Take care.  Glad to see you.  

## 2013-09-25 ENCOUNTER — Ambulatory Visit: Payer: Medicare Other | Admitting: Family Medicine

## 2013-09-25 LAB — CBC WITH DIFFERENTIAL/PLATELET
Basophils Absolute: 0 10*3/uL (ref 0.0–0.1)
Basophils Relative: 0.2 % (ref 0.0–3.0)
EOS ABS: 0 10*3/uL (ref 0.0–0.7)
Eosinophils Relative: 0.8 % (ref 0.0–5.0)
HCT: 33.7 % — ABNORMAL LOW (ref 39.0–52.0)
HEMOGLOBIN: 11.6 g/dL — AB (ref 13.0–17.0)
LYMPHS PCT: 24.3 % (ref 12.0–46.0)
Lymphs Abs: 1.3 10*3/uL (ref 0.7–4.0)
MCHC: 34.3 g/dL (ref 30.0–36.0)
MCV: 92.7 fl (ref 78.0–100.0)
Monocytes Absolute: 0.4 10*3/uL (ref 0.1–1.0)
Monocytes Relative: 7.3 % (ref 3.0–12.0)
NEUTROS PCT: 67.4 % (ref 43.0–77.0)
Neutro Abs: 3.5 10*3/uL (ref 1.4–7.7)
Platelets: 215 10*3/uL (ref 150.0–400.0)
RBC: 3.64 Mil/uL — ABNORMAL LOW (ref 4.22–5.81)
RDW: 13.3 % (ref 11.5–15.5)
WBC: 5.2 10*3/uL (ref 4.0–10.5)

## 2013-09-25 LAB — COMPREHENSIVE METABOLIC PANEL
ALBUMIN: 3.7 g/dL (ref 3.5–5.2)
ALT: 19 U/L (ref 0–53)
AST: 21 U/L (ref 0–37)
Alkaline Phosphatase: 56 U/L (ref 39–117)
BUN: 24 mg/dL — AB (ref 6–23)
CALCIUM: 9.4 mg/dL (ref 8.4–10.5)
CHLORIDE: 97 meq/L (ref 96–112)
CO2: 32 meq/L (ref 19–32)
Creatinine, Ser: 0.9 mg/dL (ref 0.4–1.5)
GFR: 88.74 mL/min (ref >60.00)
Glucose, Bld: 94 mg/dL (ref 70–99)
POTASSIUM: 4.2 meq/L (ref 3.5–5.1)
Sodium: 134 mEq/L — ABNORMAL LOW (ref 135–145)
Total Bilirubin: 0.8 mg/dL (ref 0.2–1.2)
Total Protein: 6.3 g/dL (ref 6.0–8.3)

## 2013-09-25 LAB — PSA, MEDICARE: PSA: 0.23 ng/ml (ref 0.10–4.00)

## 2013-09-25 NOTE — Assessment & Plan Note (Signed)
See notes on labs. I want cards input.  If his labs are unremarkable, and cards doesn't think the weight loss is related to his valve pathology, then we may need to get him to see GI.  D/w pt.  He agrees.

## 2013-09-30 ENCOUNTER — Encounter: Payer: Self-pay | Admitting: Family Medicine

## 2013-09-30 ENCOUNTER — Ambulatory Visit (INDEPENDENT_AMBULATORY_CARE_PROVIDER_SITE_OTHER): Payer: Medicare Other | Admitting: Family Medicine

## 2013-09-30 ENCOUNTER — Telehealth: Payer: Self-pay | Admitting: Family Medicine

## 2013-09-30 VITALS — BP 142/62 | HR 72 | Temp 97.7°F | Wt 130.5 lb

## 2013-09-30 DIAGNOSIS — R634 Abnormal weight loss: Secondary | ICD-10-CM

## 2013-09-30 NOTE — Telephone Encounter (Signed)
Would have patient see GI re: weight loss. Referral is in.

## 2013-09-30 NOTE — Patient Instructions (Addendum)
Rosaria Ferries will call about your referral [facility]).   Try taking miralax in the meantime to help with constipation.   No charge for visit.

## 2013-09-30 NOTE — Assessment & Plan Note (Signed)
Labs unremarkable.  I didn't want to send him for a CT scan given his back and his overall level of pain.  I would like GI input re: weight loss.  He agrees.  I had talked with Dr. Rockey Situ and this doesn't seem to be related to this valve pathology.   No charge for visit- we tried to call him to go over the plan via phone.  I told him I was still glad to see him.

## 2013-09-30 NOTE — Progress Notes (Signed)
Pre visit review using our clinic review tool, if applicable. No additional management support is needed unless otherwise documented below in the visit note.  Discussed recent labs and weight loss. Still with some nausea, but no vomiting.  No blood in stool but some constipation.  No fevers.  PSA wnl, no acute changes on prev CXR.  No obvious source of weight loss noted on labs, ie LFTs wnl.  dw pt.    Meds, vitals, and allergies reviewed.   ROS: See HPI.  Otherwise, noncontributory.  nad  Thin  Mmm  Neck supple, no LA  rrr with SEM noted  ctab  Back with obvious scoliotic changes  abd soft, not ttp  Ext w/o edema

## 2013-10-01 ENCOUNTER — Ambulatory Visit (INDEPENDENT_AMBULATORY_CARE_PROVIDER_SITE_OTHER): Payer: Medicare Other | Admitting: Cardiovascular Disease

## 2013-10-01 ENCOUNTER — Encounter: Payer: Self-pay | Admitting: Cardiovascular Disease

## 2013-10-01 VITALS — BP 120/70 | HR 73 | Ht 70.0 in | Wt 129.5 lb

## 2013-10-01 DIAGNOSIS — E785 Hyperlipidemia, unspecified: Secondary | ICD-10-CM

## 2013-10-01 DIAGNOSIS — I10 Essential (primary) hypertension: Secondary | ICD-10-CM

## 2013-10-01 DIAGNOSIS — I350 Nonrheumatic aortic (valve) stenosis: Secondary | ICD-10-CM

## 2013-10-01 DIAGNOSIS — R0602 Shortness of breath: Secondary | ICD-10-CM

## 2013-10-01 DIAGNOSIS — R634 Abnormal weight loss: Secondary | ICD-10-CM

## 2013-10-01 DIAGNOSIS — I359 Nonrheumatic aortic valve disorder, unspecified: Secondary | ICD-10-CM

## 2013-10-01 NOTE — Assessment & Plan Note (Signed)
Severe aortic valve stenosis. We did discuss possible options for his valve including TAVR. He is interested. He reports not currently having significant symptoms with daily exertion. We will discuss options again on his followup visit. We did suggest a cough [place] if he would like referral to [facility]. He would likely be a challenging surgical candidate given his kyphosis and scoliosis. He may benefit from TAVR if he is a candidate.

## 2013-10-01 NOTE — Progress Notes (Signed)
Patient ID: Douglas Parker, male    DOB: 1927/11/20, 80 y.o.   MRN: [id number]  HPI Comments: Douglas Parker is a 80 year old gentleman with no known coronary artery disease, history of hypertension, hyperlipidemia, chronic mild shortness of breath,  aortic valve sclerosis who presents for routine followup. He has severe scoliosis and kyphosis TEE performed October 2014 showing moderate to severe aortic valve stenosis, estimated aortic valve area 1.2 cm  In followup today, he denies having any significant shortness of breath with exertion. He has lost weight since his prior clinic visit. He is taking care of 50 cats, 4 dogs. He states it is a long story but he was kind to stray cats and these had 80s despite him paying to have them spade. He is eating less as he is trying to feed all of them. Weight is down from 147 pounds in February down to 130 pounds. He eats only one meal per day. Occasional nausea No significant chest tightness, no lightheadedness. Apart from weight loss, he feels well.   He does have occasional shortness of breath when he lifts heavy items such as dogfood bags. He is still able to do low-grade work around the house. Cannot do what he used to do.  Previous Echocardiogram in 2014 showed severe aortic valve stenosis, severe calcification of the valve with restriction of the leaflets. Mean gradient of 41 mm of mercury, peak gradient 80 mm of mercury  Previous Total cholesterol 154, LDL 84   Echocardiogram from April 2010 showed normal systolic function, minimal aortic valve stenosis, mildly dilated left atrium, mild TR. Carotid arterial Doppler done last year showed mild plaquing less than 39% bilaterally. Last stress test was 3 years ago with no ischemia. He exercised for 5 minutes, 30 seconds. Achieved 13 METS  EKG shows normal sinus rhythm with  73 bpm,  PVC   Outpatient Encounter Prescriptions as of 10/01/2013  Medication Sig  . aspirin 81 MG EC tablet Take 81 mg  by mouth daily.    Marland Kitchen atorvastatin (LIPITOR) 20 MG tablet Take 1 tablet (20 mg total) by mouth at bedtime.  . finasteride (PROSCAR) 5 MG tablet TAKE ONE TABLET BY MOUTH ONCE DAILY  . HYDROcodone-acetaminophen (NORCO/VICODIN) 5-325 MG per tablet Take 1/2 to1 by mouth every 8 hours as needed for pain  . lisinopril-hydrochlorothiazide (ZESTORETIC) 20-12.5 MG per tablet Take 1 tablet by mouth daily.  . Multiple Vitamin (MULTIVITAMIN) tablet Take 1 tablet by mouth daily.    . multivitamin-lutein (OCUVITE-LUTEIN) CAPS Take 1 capsule by mouth daily.    . Omega-3 Fatty Acids (FISH OIL) 1000 MG CAPS Take by mouth daily.    . TRAVATAN Z 0.004 % SOLN ophthalmic solution Place 1 drop into the right eye at bedtime.   . vitamin C (ASCORBIC ACID) 500 MG tablet Take 1,000 mg by mouth daily.   . vitamin E 400 UNIT capsule Take 400 Units by mouth daily.      Review of Systems  Constitutional: Positive for unexpected weight change.  HENT: Negative.   Eyes: Negative.   Respiratory: Positive for shortness of breath.   Cardiovascular: Negative.   Gastrointestinal: Negative.   Endocrine: Negative.   Musculoskeletal: Positive for back pain.  Skin: Negative.   Allergic/Immunologic: Negative.   Neurological: Negative.   Hematological: Negative.   Psychiatric/Behavioral: Negative.   All other systems reviewed and are negative.   BP 120/70  Pulse 73  Ht 5\' 10"  (1.778 m)  Wt 129 lb 8 oz (58.741  kg)  BMI 18.58 kg/m2  Physical Exam  Nursing note and vitals reviewed. Constitutional: He is oriented to person, place, and time. He appears well-developed and well-nourished.  Severe curvature of his back from scoliosis  HENT:  Head: Normocephalic.  Nose: Nose normal.  Mouth/Throat: Oropharynx is clear and moist.  Eyes: Conjunctivae are normal. Pupils are equal, round, and reactive to light.  Neck: Normal range of motion. Neck supple. No JVD present. Carotid bruit is present.  Cardiovascular: Normal rate,  regular rhythm, S1 normal, S2 normal and intact distal pulses.  Exam reveals no gallop and no friction rub.   Murmur heard.  Crescendo systolic murmur is present with a grade of 3/6  Pulmonary/Chest: Effort normal and breath sounds normal. No respiratory distress. He has no wheezes. He has no rales. He exhibits no tenderness.  Abdominal: Soft. Bowel sounds are normal. He exhibits no distension. There is no tenderness.  Musculoskeletal: Normal range of motion. He exhibits no edema and no tenderness.  Lymphadenopathy:    He has no cervical adenopathy.  Neurological: He is alert and oriented to person, place, and time. Coordination normal.  Skin: Skin is warm and dry. No rash noted. No erythema.  Psychiatric: He has a normal mood and affect. His behavior is normal. Judgment and thought content normal.      Assessment and Plan

## 2013-10-01 NOTE — Patient Instructions (Signed)
You are doing well. No medication changes were made.  Please call us if you have new issues that need to be addressed before your next appt.  Your physician wants you to follow-up in: 6 months.  You will receive a reminder letter in the mail two months in advance. If you don't receive a letter, please call our office to schedule the follow-up appointment.   

## 2013-10-01 NOTE — Assessment & Plan Note (Signed)
Blood pressure is well controlled on today's visit. No changes made to the medications. 

## 2013-10-01 NOTE — Assessment & Plan Note (Signed)
Suggested he continue on his low-dose Lipitor

## 2013-10-01 NOTE — Assessment & Plan Note (Signed)
Mild shortness of breath symptoms likely from underlying severe aortic valve stenosis

## 2013-10-01 NOTE — Assessment & Plan Note (Signed)
He is not feeding himself well. He takes care of 50 cats, 4 dogs. Only eating one meal per day.

## 2013-10-02 ENCOUNTER — Encounter: Payer: Self-pay | Admitting: *Deleted

## 2013-10-08 ENCOUNTER — Telehealth: Payer: Self-pay

## 2013-10-08 NOTE — Telephone Encounter (Signed)
Pt states he is "ready to have his valves put in " please call.

## 2013-10-09 ENCOUNTER — Encounter: Payer: Self-pay | Admitting: Nurse Practitioner

## 2013-10-09 ENCOUNTER — Ambulatory Visit (INDEPENDENT_AMBULATORY_CARE_PROVIDER_SITE_OTHER): Payer: Medicare Other | Admitting: Nurse Practitioner

## 2013-10-09 VITALS — BP 110/60 | HR 66 | Ht 70.0 in | Wt 133.0 lb

## 2013-10-09 DIAGNOSIS — R634 Abnormal weight loss: Secondary | ICD-10-CM

## 2013-10-09 DIAGNOSIS — R110 Nausea: Secondary | ICD-10-CM

## 2013-10-09 NOTE — Telephone Encounter (Signed)
Left message for pt to call back  °

## 2013-10-09 NOTE — Patient Instructions (Signed)
You have been scheduled for an endoscopy with Dr. Fuller Plan. Please follow written instructions given to you at your visit today. If you use inhalers (even only as needed), please bring them with you on the day of your procedure. Your physician has requested that you go to www.startemmi.com and enter the access code given to you at your visit today. This web site gives a general overview about your procedure. However, you should still follow specific instructions given to you by our office regarding your preparation for the procedure.

## 2013-10-09 NOTE — Telephone Encounter (Signed)
Will need cardiac catheterization here in [place]  first Then we can refer to TAVR clinic Possibly could schedule next Tuesday morning July 28  For cath

## 2013-10-09 NOTE — Progress Notes (Signed)
HPI :  Patient is an 80 year old male referred for evaluation of weight loss. He is known that remotely to Dr. Fuller Plan . He had a colonoscopy in 2004 for hematochezia . Findings included internal hemorrhoids, diverticulosis and a 60mm transverse colon polyp (unable to locate pathology report) . In May 2007 patient had a flexible sigmoidoscopy for evaluation of an abnormal digital rectal exam (nodule). Findings included diverticulosis and internal hemorrhoids (probably perceived as nodule on DRE).    Workup by PCP for weight loss has been unrevealing including a comprehensive metabolic profile, urinalysis and CXR. Patient reports a 50 pound weight loss over the last several months which he attributes to a diminished appetite. He does have some nausea without vomiting. Patient takes a daily baby aspirin, no other NSAIDs. He has occasional left upper quadrant discomfort not related to meals, no known aggravating factors. He has been a little constipated over the last couple of weeks, no rectal bleeding. MiraLax works when he takes it.  Patient is in need of an aortic valve replacement. He has moderate to severe aortic valve stenosis and is followed by Dr. Rockey Situ. PCP spoke with cardiologist who does not feel weight loss is related to valve pathology. Patient tells me he is not sure that he is agreeable to a valve replacement  Past Medical History  Diagnosis Date  . Hyperlipidemia 09/05/95  . Diverticulosis of colon 06/05/2002  . Benign prostatic hypertrophy 10/18/00  . Hypertension 05/19/03  . CHF (congestive heart failure) 04/21/03  . Scoliosis   . Colon polyps 06/05/2002    path could not be found     Family History  Problem Relation Age of Onset  . Stroke Father   . Diabetes Brother   . Hypertension Other   . Hip fracture Sister     after a fall  . Diabetes Brother   . Coronary artery disease Brother     carotid stenosis  . Colon cancer Neg Hx   . Prostate cancer Neg Hx    History    Substance Use Topics  . Smoking status: Former Smoker -- 1.00 packs/day for 20 years    Types: Cigarettes    Quit date: 01/11/1971  . Smokeless tobacco: Former Systems developer     Comment: quit 40 years ago  . Alcohol Use: No   Current Outpatient Prescriptions  Medication Sig Dispense Refill  . aspirin 81 MG EC tablet Take 81 mg by mouth daily.        Marland Kitchen atorvastatin (LIPITOR) 20 MG tablet Take 1 tablet (20 mg total) by mouth at bedtime.  30 tablet  12  . finasteride (PROSCAR) 5 MG tablet TAKE ONE TABLET BY MOUTH ONCE DAILY  30 tablet  9  . lisinopril-hydrochlorothiazide (ZESTORETIC) 20-12.5 MG per tablet Take 1 tablet by mouth daily.  30 tablet  11  . Multiple Vitamin (MULTIVITAMIN) tablet Take 1 tablet by mouth daily.        . multivitamin-lutein (OCUVITE-LUTEIN) CAPS Take 1 capsule by mouth daily.        . Omega-3 Fatty Acids (FISH OIL) 1000 MG CAPS Take by mouth daily.        . TRAVATAN Z 0.004 % SOLN ophthalmic solution Place 1 drop into the right eye at bedtime.       . vitamin C (ASCORBIC ACID) 500 MG tablet Take 1,000 mg by mouth daily.       . vitamin E 400 UNIT capsule Take 400 Units by mouth daily.  No current facility-administered medications for this visit.   No Known Allergies   Review of Systems: Some SOB (chronic). All other systems reviewed and negative except where noted in HPI.    Physical Exam: BP 110/60  Pulse 66  Ht 5\' 10"  (1.778 m)  Wt 133 lb (60.328 kg)  BMI 19.08 kg/m2 Constitutional: Pleasant, thin white male in no acute distress. HEENT: Normocephalic and atraumatic. Conjunctivae are normal. No scleral icterus. Neck supple.  Cardiovascular: Regular rate and rhythm, murmur heard. Pulmonary/chest: Effort normal and breath sounds normal. No wheezing, rales or rhonchi. Abdominal: Soft, nondistended, nontender. Bowel sounds active throughout. There are no masses palpable. No hepatomegaly. Extremities: no edema Lymphadenopathy: No cervical adenopathy  noted. Neurological: Alert and oriented to person place and time. Skin: Skin is warm and dry. No rashes noted. Psychiatric: Normal mood and affect. Behavior is normal.   ASSESSMENT AND PLAN:  #22. 80 year old male with significant weight loss over the last several months. He has nausea and occasional left mid abdominal discomfort. Labs, chest x-ray and urinalysis unremarkable. Obviously we need to rule out an underlying malignancy. For further evaluation patient will be scheduled for upper endoscopy. He is at increased risk for procedures given advanced age and comorbidities including aortic stenosis.The benefits, risks, and potential complications of EGD with possible biopsies were discussed with the patient and he agrees to proceed. If the upper endoscopy is unrevealing then would proceed with CT scan of the abdomen and pelvis  #2. Moderate to severe aortic stenosis. Patient not sure he wants replacement valve. He is followed by Dr. Rockey Situ.

## 2013-10-10 NOTE — Telephone Encounter (Signed)
Spoke w/ pt.  He reports that he has a cold and would like to wait to sched cath for when he is feeling better.  He will call back when he is ready.

## 2013-10-12 NOTE — Progress Notes (Signed)
Reviewed and agree with management plan with the following changes: Cancel EGD due to moderate to severe AS as sedation/EGD is higher risk.  Proceed with abd/pelvic CT first and if negative then UGI series.   Pricilla Riffle. Fuller Plan, MD [facility]

## 2013-10-13 ENCOUNTER — Encounter: Payer: Self-pay | Admitting: Gastroenterology

## 2013-10-13 ENCOUNTER — Telehealth: Payer: Self-pay

## 2013-10-13 DIAGNOSIS — R634 Abnormal weight loss: Secondary | ICD-10-CM

## 2013-10-13 DIAGNOSIS — R110 Nausea: Secondary | ICD-10-CM

## 2013-10-13 NOTE — Telephone Encounter (Signed)
Patient notified of recommendations and he agrees to CT.  He will come in and pick up the contrast and instructions this week

## 2013-10-13 NOTE — Telephone Encounter (Signed)
Ladene Artist, MD at 10/12/2013 9:28 AM     Status: Signed        Reviewed and agree with management plan with the following changes:  Cancel EGD due to moderate to severe AS as sedation/EGD is higher risk.  Proceed with abd/pelvic CT first and if negative then UGI series.  Pricilla Riffle. Fuller Plan, MD [facility]   The above message is an append on the office visit from [place] on 10/09/13.  I have left a message for the patient to call back and discuss changes.   CT scan scheduled for 10/17/13 11:00

## 2013-10-14 ENCOUNTER — Telehealth: Payer: Self-pay | Admitting: *Deleted

## 2013-10-14 NOTE — Telephone Encounter (Signed)
Message copied by Hulan Saas on Tue Oct 14, 2013  8:35 AM ------      Message from: Willia Craze      Created: Mon Oct 13, 2013  4:54 PM       Hi Rollene Fare, will you please cancel his EGD. Fuller Plan feels too high risk. Wants to get CTscan of abd/pelvis to rule out malignancy instead. Will you schedule CTplease. Thanks      PG ------

## 2013-10-14 NOTE — Telephone Encounter (Signed)
Patient has already been scheduled for CT .

## 2013-10-17 ENCOUNTER — Encounter: Payer: Medicare Other | Admitting: Gastroenterology

## 2013-10-17 ENCOUNTER — Other Ambulatory Visit: Payer: Self-pay

## 2013-10-17 ENCOUNTER — Ambulatory Visit (INDEPENDENT_AMBULATORY_CARE_PROVIDER_SITE_OTHER)
Admission: RE | Admit: 2013-10-17 | Discharge: 2013-10-17 | Disposition: A | Discharge status: Home / Self Care | Payer: Medicare Other | Source: Ambulatory Visit | Attending: Gastroenterology | Admitting: Gastroenterology

## 2013-10-17 DIAGNOSIS — R634 Abnormal weight loss: Secondary | ICD-10-CM

## 2013-10-17 DIAGNOSIS — R110 Nausea: Secondary | ICD-10-CM

## 2013-10-17 MED ORDER — IOHEXOL 300 MG/ML  SOLN
100.0000 mL | Freq: Once | INTRAMUSCULAR | Status: AC | PRN
  Administered 2013-10-17: 100 mL via INTRAVENOUS

## 2013-10-21 ENCOUNTER — Ambulatory Visit (HOSPITAL_COMMUNITY)
Admission: RE | Admit: 2013-10-21 | Discharge: 2013-10-21 | Disposition: A | Discharge status: Home / Self Care | Payer: Medicare Other | Source: Ambulatory Visit | Attending: Gastroenterology | Admitting: Gastroenterology

## 2013-10-21 ENCOUNTER — Ambulatory Visit (HOSPITAL_COMMUNITY): Payer: Medicare Other

## 2013-10-21 ENCOUNTER — Other Ambulatory Visit: Payer: Self-pay

## 2013-10-21 ENCOUNTER — Other Ambulatory Visit: Payer: Self-pay | Admitting: Gastroenterology

## 2013-10-21 DIAGNOSIS — R634 Abnormal weight loss: Secondary | ICD-10-CM

## 2013-10-21 DIAGNOSIS — K219 Gastro-esophageal reflux disease without esophagitis: Secondary | ICD-10-CM | POA: Insufficient documentation

## 2013-10-21 DIAGNOSIS — K449 Diaphragmatic hernia without obstruction or gangrene: Secondary | ICD-10-CM | POA: Diagnosis not present

## 2013-10-21 MED ORDER — OMEPRAZOLE 20 MG PO CPDR: 20.0000 mg | DELAYED_RELEASE_CAPSULE | Freq: Every day | ORAL | Status: DC

## 2013-10-27 ENCOUNTER — Telehealth: Payer: Self-pay

## 2013-10-27 DIAGNOSIS — I350 Nonrheumatic aortic (valve) stenosis: Secondary | ICD-10-CM

## 2013-10-27 DIAGNOSIS — Z01812 Encounter for preprocedural laboratory examination: Secondary | ICD-10-CM

## 2013-10-27 HISTORY — PX: CARDIAC CATHETERIZATION: SHX172

## 2013-10-27 NOTE — Telephone Encounter (Signed)
Spoke w/ pt.  He states that he is ready to sched his cath.  Advised pt that I will speak w/ Dr. Rockey Situ about when to set this up and call pt back w/ date.

## 2013-10-30 ENCOUNTER — Telehealth: Payer: Self-pay | Admitting: *Deleted

## 2013-10-30 NOTE — Telephone Encounter (Signed)
Douglas Parker at 10/30/2013 2:58 PM     Status: Signed        Patient called wanting to set up cath.

## 2013-10-30 NOTE — Telephone Encounter (Signed)
Would see if AM of Aug 27th would work for him

## 2013-10-30 NOTE — Telephone Encounter (Signed)
See previous phone note.  

## 2013-10-30 NOTE — Telephone Encounter (Signed)
Patient called wanting to set up cath.

## 2013-10-31 NOTE — Telephone Encounter (Signed)
Spoke w/ pt.  He is agreeable to 8/27 and is sched to come in on Mon 8/17 to have precath labs drawn and go over instructions.

## 2013-10-31 NOTE — Telephone Encounter (Signed)
Left message for pt to call back  °

## 2013-11-03 ENCOUNTER — Ambulatory Visit (INDEPENDENT_AMBULATORY_CARE_PROVIDER_SITE_OTHER): Payer: Medicare Other | Admitting: *Deleted

## 2013-11-03 DIAGNOSIS — I359 Nonrheumatic aortic valve disorder, unspecified: Secondary | ICD-10-CM

## 2013-11-03 DIAGNOSIS — I350 Nonrheumatic aortic (valve) stenosis: Secondary | ICD-10-CM

## 2013-11-03 DIAGNOSIS — Z01812 Encounter for preprocedural laboratory examination: Secondary | ICD-10-CM

## 2013-11-04 ENCOUNTER — Ambulatory Visit (INDEPENDENT_AMBULATORY_CARE_PROVIDER_SITE_OTHER): Payer: Medicare Other | Admitting: Family Medicine

## 2013-11-04 ENCOUNTER — Telehealth: Payer: Self-pay | Admitting: *Deleted

## 2013-11-04 ENCOUNTER — Encounter: Payer: Self-pay | Admitting: Family Medicine

## 2013-11-04 VITALS — BP 122/50 | HR 80 | Temp 97.8°F | Wt 142.8 lb

## 2013-11-04 DIAGNOSIS — I499 Cardiac arrhythmia, unspecified: Secondary | ICD-10-CM

## 2013-11-04 DIAGNOSIS — I359 Nonrheumatic aortic valve disorder, unspecified: Secondary | ICD-10-CM

## 2013-11-04 DIAGNOSIS — I350 Nonrheumatic aortic (valve) stenosis: Secondary | ICD-10-CM

## 2013-11-04 LAB — BASIC METABOLIC PANEL
BUN/Creatinine Ratio: 19 (ref 10–22)
BUN: 16 mg/dL (ref 8–27)
CO2: 29 mmol/L (ref 18–29)
Calcium: 9 mg/dL (ref 8.6–10.2)
Chloride: 94 mmol/L — ABNORMAL LOW (ref 97–108)
Creatinine, Ser: 0.85 mg/dL (ref 0.76–1.27)
GFR, EST AFRICAN AMERICAN: 92 mL/min/{1.73_m2} (ref >59)
GFR, EST NON AFRICAN AMERICAN: 80 mL/min/{1.73_m2} (ref >59)
Glucose: 97 mg/dL (ref 65–99)
Potassium: 4.9 mmol/L (ref 3.5–5.2)
SODIUM: 134 mmol/L (ref 134–144)

## 2013-11-04 LAB — CBC WITH DIFFERENTIAL
Basophils Absolute: 0 10*3/uL (ref 0.0–0.2)
Basos: 0 %
EOS: 1 %
Eosinophils Absolute: 0.1 10*3/uL (ref 0.0–0.4)
HCT: 33.3 % — ABNORMAL LOW (ref 37.5–51.0)
Hemoglobin: 11.3 g/dL — ABNORMAL LOW (ref 12.6–17.7)
Immature Grans (Abs): 0 10*3/uL (ref 0.0–0.1)
Immature Granulocytes: 0 %
Lymphocytes Absolute: 0.9 10*3/uL (ref 0.7–3.1)
Lymphs: 17 %
MCH: 31.1 pg (ref 26.6–33.0)
MCHC: 33.9 g/dL (ref 31.5–35.7)
MCV: 92 fL (ref 79–97)
MONOCYTES: 6 %
Monocytes Absolute: 0.3 10*3/uL (ref 0.1–0.9)
NEUTROS ABS: 4.1 10*3/uL (ref 1.4–7.0)
Neutrophils Relative %: 76 %
Platelets: 195 10*3/uL (ref 150–379)
RBC: 3.63 x10E6/uL — AB (ref 4.14–5.80)
RDW: 13.2 % (ref 12.3–15.4)
WBC: 5.4 10*3/uL (ref 3.4–10.8)

## 2013-11-04 LAB — PROTIME-INR
INR: 1.1 (ref 0.8–1.2)
PROTHROMBIN TIME: 12 s (ref 9.1–12.0)

## 2013-11-04 MED ORDER — FUROSEMIDE 20 MG PO TABS: 20.0000 mg | ORAL_TABLET | Freq: Every day | ORAL | Status: DC | PRN

## 2013-11-04 NOTE — Telephone Encounter (Signed)
Lmom to call our office to schedule a echocardiogram (1 yr f/u).

## 2013-11-04 NOTE — Progress Notes (Signed)
Pre visit review using our clinic review tool, if applicable. No additional management support is needed unless otherwise documented below in the visit note.  Diffuse BLE edema over the last few days.  H/o swelling prev, but "not like this."  No CP.  Not more SOB, but he has some at baseline from his restriction from scoliosis.  Feels sensation of skipped beats, but that has been noted by patient for years, pt didn't think this was a change from baseline.  No salt loading.  Compliant with baseline meds.    I called Dr. Rockey Situ during the office visit, d/w pt about plan and conversation.    H/o aortic stenosis prev noted.  Cath planned with possible eval for TAVR tx pending.   Meds, vitals, and allergies reviewed.   ROS: See HPI.  Otherwise, noncontributory.  nad ncat Mmm Murmur, trigeminy noted on auscultation.  Not tachy ctab Obvious scoliosis noted.  abd soft Ext with 2+ BLE, pitting, up to the shins B

## 2013-11-04 NOTE — Patient Instructions (Signed)
Take 20mg  of lasix tomorrow AM and then call me tomorrow afternoon.

## 2013-11-05 ENCOUNTER — Telehealth: Payer: Self-pay | Admitting: Family Medicine

## 2013-11-05 MED ORDER — DOXYCYCLINE HYCLATE 100 MG PO TABS: 100.0000 mg | ORAL_TABLET | Freq: Two times a day (BID) | ORAL | Status: DC

## 2013-11-05 NOTE — Assessment & Plan Note (Signed)
Now with edema but CTAB.  D/w Dr. Rockey Situ re: status and trigeminy.   Start lasix, patient to update me tomorrow.  We'll adjust his dose depending on response.  Recent Cr wnl.  Okay for outpatient f/u.  >25 minutes spent in face to face time with patient, >50% spent in counselling or coordination of care.

## 2013-11-05 NOTE — Telephone Encounter (Signed)
Pt called.  Leg edema much better on the R leg, after 1 dose of lasix.  Now with some persistent swelling and redness but no purulent discharge on the L leg.  D/w pt.   Would start abx today, hold lasix tomorrow AM unless the edema returns in both legs.   Rx sent.   Please schedule him tomorrow for recheck of legs.  See if he can get 2 doses of doxy in today.  Thanks.

## 2013-11-05 NOTE — Telephone Encounter (Signed)
Patient notified as instructed by telephone. Appointment scheduled for tomorrow.

## 2013-11-05 NOTE — Telephone Encounter (Signed)
Tried to reach patient by telephone, no answer. Will try again later.

## 2013-11-06 ENCOUNTER — Encounter: Payer: Self-pay | Admitting: Family Medicine

## 2013-11-06 ENCOUNTER — Ambulatory Visit (INDEPENDENT_AMBULATORY_CARE_PROVIDER_SITE_OTHER): Payer: Medicare Other | Admitting: Family Medicine

## 2013-11-06 VITALS — BP 102/40 | HR 80 | Temp 97.9°F | Wt 141.2 lb

## 2013-11-06 DIAGNOSIS — L03119 Cellulitis of unspecified part of limb: Secondary | ICD-10-CM

## 2013-11-06 DIAGNOSIS — L02419 Cutaneous abscess of limb, unspecified: Secondary | ICD-10-CM

## 2013-11-06 DIAGNOSIS — L03116 Cellulitis of left lower limb: Secondary | ICD-10-CM | POA: Insufficient documentation

## 2013-11-06 MED ORDER — CEPHALEXIN 500 MG PO CAPS: 500.0000 mg | ORAL_CAPSULE | Freq: Four times a day (QID) | ORAL | Status: DC

## 2013-11-06 NOTE — Patient Instructions (Signed)
Call me with an update tomorrow.   Start keflex 4 times a day.  Continue doxycycline twice a day.  Take care.  Glad to see you.

## 2013-11-06 NOTE — Assessment & Plan Note (Signed)
I don't suspect a DVT.  This looks to be a superficial cellulitis.  He doesn't have as much foot edema as I would expect if he had a DVT.  D/w pt. Continue doxy, add on keflex.  He'll update me tomorrow.  Hold lasix for now unless he has a lot of R leg edema.  He agrees.   Routed to Dr. Rockey Situ as a FYI with the cath upcoming.

## 2013-11-06 NOTE — Progress Notes (Signed)
Pre visit review using our clinic review tool, if applicable. No additional management support is needed unless otherwise documented below in the visit note.  Seen with BLE edema recently, took lasix yesterday AM, dec in R leg edema, but L leg red hot and tender, still swollen. Pain with walking.  No fevers.  Started on doxy in the meantime.  Still with less R leg edema today, didn't need another dose of lasix this AM.  BP lower, as expected, weight down slightly.    Meds, vitals, and allergies reviewed.   ROS: See HPI.  Otherwise, noncontributory.  nad  ncat  Mmm  rrr with ectopy noted.  Murmur at baseline, SEM. Not tachy  ctab  Obvious scoliosis noted.  abd soft L leg edema noted at the calf circumferentially, warm, no fluctuant mass. Less foot edema noted.   R leg w/o edema.

## 2013-11-09 ENCOUNTER — Telehealth: Payer: Self-pay | Admitting: Family Medicine

## 2013-11-09 NOTE — Telephone Encounter (Signed)
Late entry, call from patient 11/07/13.  Dec in swelling/redness/pain in L leg, now on doxy/keflex.  He is improved, didn't need any more lasix.  He'll update me Monday.

## 2013-11-10 ENCOUNTER — Ambulatory Visit (INDEPENDENT_AMBULATORY_CARE_PROVIDER_SITE_OTHER): Payer: Medicare Other | Admitting: Family Medicine

## 2013-11-10 ENCOUNTER — Encounter: Payer: Self-pay | Admitting: Family Medicine

## 2013-11-10 VITALS — BP 88/44 | HR 64 | Temp 97.9°F | Wt 142.5 lb

## 2013-11-10 DIAGNOSIS — L02419 Cutaneous abscess of limb, unspecified: Secondary | ICD-10-CM

## 2013-11-10 DIAGNOSIS — L03116 Cellulitis of left lower limb: Secondary | ICD-10-CM

## 2013-11-10 DIAGNOSIS — L03119 Cellulitis of unspecified part of limb: Secondary | ICD-10-CM

## 2013-11-10 NOTE — Patient Instructions (Signed)
Stay off the lasix and lisinopril/hydrochlorothiazide for now.  Keep taking the antibiotics (both of them) and I'll check with Dr. Rockey Situ in the meantime.  Take care.

## 2013-11-10 NOTE — Assessment & Plan Note (Signed)
Improved but not resolved. Cath planned for later this week.  Will notify cards re: cellulitis.  He'll hold his lasix and stay of ACE/HCTZ for now given the soft BP.   Weight stable.  He agrees with plan.   Pt to update me re: leg via phone in ~48 hours.  Sooner if needed.   Continue both abx for now.

## 2013-11-10 NOTE — Progress Notes (Signed)
Pre visit review using our clinic review tool, if applicable. No additional management support is needed unless otherwise documented below in the visit note.  Recheck L leg cellulitis.  On keflex and doxy.  L leg is less red today.  No fevers.  He hasn't taken lasix in the last few days.  He had not been taking the ACE/HCTZ recently.    He has cath planned for later this week.  Weight is stable.    Meds, vitals, and allergies reviewed.   ROS: See HPI.  Otherwise, noncontributory.  nad Uncomfortable when standing, less pain from his back when sitting Rrr, with occ ectopy with murmur noted ctab Obvious scoliosis 1-2+ BLE edema.  L lower leg less red, minimally ttp, no fluctuant mass.

## 2013-11-13 ENCOUNTER — Ambulatory Visit: Payer: Self-pay | Admitting: Cardiovascular Disease

## 2013-11-13 DIAGNOSIS — I2510 Atherosclerotic heart disease of native coronary artery without angina pectoris: Secondary | ICD-10-CM

## 2013-11-17 ENCOUNTER — Telehealth: Payer: Self-pay | Admitting: *Deleted

## 2013-11-17 NOTE — Telephone Encounter (Signed)
Please try to get an update from patient.  The prev rxs should have covered it- if still with sx then let me know.  Thanks.

## 2013-11-17 NOTE — Telephone Encounter (Signed)
Received a fax from pharmacy for refills on the doxycyline and cephalexin prescribed almost 2 weeks ago for cellulitis. I called pt for an update on symptoms, and to offer a f/u appt tomorrow. I didn't get an answer, so I left message on voicemail for pt to return call to office.

## 2013-11-19 NOTE — Telephone Encounter (Signed)
I left message for pt to return call to office.

## 2013-11-20 ENCOUNTER — Other Ambulatory Visit: Payer: Medicare Other

## 2013-11-20 ENCOUNTER — Encounter: Payer: Self-pay | Admitting: Family Medicine

## 2013-11-20 ENCOUNTER — Ambulatory Visit (INDEPENDENT_AMBULATORY_CARE_PROVIDER_SITE_OTHER): Payer: Medicare Other | Admitting: Family Medicine

## 2013-11-20 VITALS — BP 112/50 | HR 80 | Temp 98.1°F | Wt 149.2 lb

## 2013-11-20 DIAGNOSIS — R609 Edema, unspecified: Secondary | ICD-10-CM

## 2013-11-20 NOTE — Patient Instructions (Signed)
Take the lasix once a day if needed to keep the swelling down, goal weight <145 lbs.  If below 145 or lightheaded, then skip the lasix that day.   Take care. Give me an update as needed.   Glad to see you.

## 2013-11-20 NOTE — Assessment & Plan Note (Signed)
Cellulitis resolved, now with R>L BLE edema, with bruising on the R leg.  Not on lasix recently.  Would continue to stay off ACE/HCTZ for now.  Restart lasix, goal weight ~145lbs.  D/w pt. He agrees.  Await cards input next week.

## 2013-11-20 NOTE — Telephone Encounter (Signed)
Appt scheduled today °

## 2013-11-20 NOTE — Progress Notes (Signed)
Pre visit review using our clinic review tool, if applicable. No additional management support is needed unless otherwise documented below in the visit note.  Recently with cath done, via R groin, 1 week ago.  No complications other than some bruising.  No stent placed. Has cards f/u pending. In meantime, L leg cellulitis resolved but now with more BLE edema.  Weight is up.  Not more SOB. No CP.  Now on oxycodone for back pain with some relief.  Off ACE/HCTZ combination. No recent lasix use.  No fevers.   Meds, vitals, and allergies reviewed.   ROS: See HPI.  Otherwise, noncontributory.  nad ncat Mmm Rrr, murmur noted ctab Scoliotic changes noted abd soft Groin site for cath clean and intact 2+ BLE edema, R>L, with bruising descending down the R leg, from the knee distally.  No erythema spreading proximally per patient

## 2013-11-28 ENCOUNTER — Ambulatory Visit (INDEPENDENT_AMBULATORY_CARE_PROVIDER_SITE_OTHER): Payer: Medicare Other | Admitting: Cardiovascular Disease

## 2013-11-28 ENCOUNTER — Encounter: Payer: Self-pay | Admitting: Cardiovascular Disease

## 2013-11-28 VITALS — BP 130/82 | HR 73 | Ht 70.0 in | Wt 144.5 lb

## 2013-11-28 DIAGNOSIS — I10 Essential (primary) hypertension: Secondary | ICD-10-CM

## 2013-11-28 DIAGNOSIS — R5381 Other malaise: Secondary | ICD-10-CM

## 2013-11-28 DIAGNOSIS — M4120 Other idiopathic scoliosis, site unspecified: Secondary | ICD-10-CM

## 2013-11-28 DIAGNOSIS — R5383 Other fatigue: Secondary | ICD-10-CM

## 2013-11-28 DIAGNOSIS — I350 Nonrheumatic aortic (valve) stenosis: Secondary | ICD-10-CM

## 2013-11-28 DIAGNOSIS — I359 Nonrheumatic aortic valve disorder, unspecified: Secondary | ICD-10-CM

## 2013-11-28 DIAGNOSIS — E785 Hyperlipidemia, unspecified: Secondary | ICD-10-CM

## 2013-11-28 MED ORDER — METOPROLOL TARTRATE 25 MG PO TABS: 25.0000 mg | ORAL_TABLET | Freq: Two times a day (BID) | ORAL | Status: DC

## 2013-11-28 NOTE — Assessment & Plan Note (Signed)
Significant kyphosis and Scoliosis.

## 2013-11-28 NOTE — Progress Notes (Signed)
Patient ID: Douglas Boyden Sr., male    DOB: 30-Jun-1927, 80 y.o.   MRN: [id number]  HPI Comments: Douglas Parker is a 80 year old gentleman with no known coronary artery disease, history of hypertension, hyperlipidemia, chronic mild shortness of breath,  aortic valve sclerosis who presents for routine followup. He has severe scoliosis and kyphosis TEE performed October 2014 showing moderate to severe aortic valve stenosis, estimated aortic valve area 1.2 cm  He underwent cardiac catheterization on 11/13/2013 that showed right dominant coronary system, possibly codominant with mild luminal irregularities, mild to moderate proximal LAD disease  In followup today, he tolerated the procedure well with no complications. He is relatively inactive, does take care of his animals. Limited by his back disease. He tries not to exert himself to an extreme He denies any significant tachycardia or palpitations. No lightheadedness or dizziness. He is interested in having his valve repaired   He does have occasional shortness of breath when he lifts heavy items such as dogfood bags. He is still able to do low-grade work around the house. Cannot do what he used to do. He is scheduled for echocardiogram next week to evaluate his aortic valve   He is taking care of 50 cats, 4 dogs. He states it is a long story but he was kind to stray cats and these had 80s despite him paying to have them spade.   Previous Echocardiogram in 2014 showed severe aortic valve stenosis, severe calcification of the valve with restriction of the leaflets. Mean gradient of 41 mm of mercury, peak gradient 80 mm of mercury  Previous Total cholesterol 154, LDL 84   Echocardiogram from April 2010 showed normal systolic function, minimal aortic valve stenosis, mildly dilated left atrium, mild TR. Carotid arterial Doppler done last year showed mild plaquing less than 39% bilaterally. Last stress test was 3 years ago with no ischemia.  He exercised for 5 minutes, 30 seconds. Achieved 13 METS  EKG shows normal sinus rhythm with  73 bpm  Outpatient Encounter Prescriptions as of 11/28/2013  Medication Sig  . aspirin 81 MG EC tablet Take 81 mg by mouth daily.    Marland Kitchen atorvastatin (LIPITOR) 20 MG tablet Take 1 tablet (20 mg total) by mouth at bedtime.  . finasteride (PROSCAR) 5 MG tablet TAKE ONE TABLET BY MOUTH ONCE DAILY  . furosemide (LASIX) 20 MG tablet Take 1 tablet (20 mg total) by mouth daily as needed for edema.  . Multiple Vitamin (MULTIVITAMIN) tablet Take 1 tablet by mouth daily.    . multivitamin-lutein (OCUVITE-LUTEIN) CAPS Take 1 capsule by mouth daily.    . Omega-3 Fatty Acids (FISH OIL) 1000 MG CAPS Take by mouth daily.    Marland Kitchen omeprazole (PRILOSEC) 20 MG capsule Take 1 capsule (20 mg total) by mouth daily.  . Oxycodone HCl 10 MG TABS Take 5-10 mg by mouth 3 (three) times daily as needed.  . TRAVATAN Z 0.004 % SOLN ophthalmic solution Place 1 drop into the right eye at bedtime.   . vitamin C (ASCORBIC ACID) 500 MG tablet Take 1,000 mg by mouth daily.   . vitamin E 400 UNIT capsule Take 400 Units by mouth daily.      Review of Systems  Constitutional: Negative.   HENT: Negative.   Eyes: Negative.   Respiratory: Positive for shortness of breath.   Cardiovascular: Negative.   Gastrointestinal: Negative.   Endocrine: Negative.   Musculoskeletal: Positive for back pain.  Skin: Negative.   Allergic/Immunologic: Negative.  Neurological: Negative.   Hematological: Negative.   Psychiatric/Behavioral: Negative.   All other systems reviewed and are negative.   BP 130/82  Pulse 73  Ht 5\' 10"  (1.778 m)  Wt 144 lb 8 oz (65.545 kg)  BMI 20.73 kg/m2  Physical Exam  Nursing note and vitals reviewed. Constitutional: He is oriented to person, place, and time. He appears well-developed and well-nourished.  Severe curvature of his back from scoliosis  HENT:  Head: Normocephalic.  Nose: Nose normal.  Mouth/Throat:  Oropharynx is clear and moist.  Eyes: Conjunctivae are normal. Pupils are equal, round, and reactive to light.  Neck: Normal range of motion. Neck supple. No JVD present. Carotid bruit is present.  Cardiovascular: Normal rate, regular rhythm, S1 normal, S2 normal and intact distal pulses.  Exam reveals no gallop and no friction rub.   Murmur heard.  Crescendo systolic murmur is present with a grade of 3/6  Murmur radiating up into his carotids bilaterally  Pulmonary/Chest: Effort normal and breath sounds normal. No respiratory distress. He has no wheezes. He has no rales. He exhibits no tenderness.  Abdominal: Soft. Bowel sounds are normal. He exhibits no distension. There is no tenderness.  Musculoskeletal: Normal range of motion. He exhibits no edema and no tenderness.  Lymphadenopathy:    He has no cervical adenopathy.  Neurological: He is alert and oriented to person, place, and time. Coordination normal.  Skin: Skin is warm and dry. No rash noted. No erythema.  Psychiatric: He has a normal mood and affect. His behavior is normal. Judgment and thought content normal.      Assessment and Plan

## 2013-11-28 NOTE — Assessment & Plan Note (Signed)
.   No changes to the medications were made.

## 2013-11-28 NOTE — Assessment & Plan Note (Addendum)
Given his aortic valve sclerosis, frequent PVCs seen on prior EKGs, we will add metoprolol 25 mg twice a day

## 2013-11-28 NOTE — Assessment & Plan Note (Addendum)
Significant aortic valve stenosis. Recent catheterization showing no significant coronary artery disease Repeat echocardiogram next week. Based on these images, will refer to [facility] for further evaluation, possible TAVR His significant kyphosis and scoliosis will be an issue

## 2013-11-28 NOTE — Patient Instructions (Signed)
You are doing well. Please start metoprolol 25 mg twice a day  We will review your echo next week and call you with a plan  Please call us if you have new issues that need to be addressed before your next appt.  Your physician wants you to follow-up in: 6 months.  You will receive a reminder letter in the mail two months in advance. If you don't receive a letter, please call our office to schedule the follow-up appointment.

## 2013-12-02 ENCOUNTER — Other Ambulatory Visit (INDEPENDENT_AMBULATORY_CARE_PROVIDER_SITE_OTHER): Payer: Medicare Other

## 2013-12-02 ENCOUNTER — Other Ambulatory Visit: Payer: Self-pay

## 2013-12-02 DIAGNOSIS — I359 Nonrheumatic aortic valve disorder, unspecified: Secondary | ICD-10-CM

## 2013-12-02 DIAGNOSIS — R0602 Shortness of breath: Secondary | ICD-10-CM

## 2013-12-02 DIAGNOSIS — I369 Nonrheumatic tricuspid valve disorder, unspecified: Secondary | ICD-10-CM

## 2013-12-02 DIAGNOSIS — I350 Nonrheumatic aortic (valve) stenosis: Secondary | ICD-10-CM

## 2013-12-08 ENCOUNTER — Encounter: Payer: Self-pay | Admitting: Cardiovascular Disease

## 2013-12-10 ENCOUNTER — Ambulatory Visit (INDEPENDENT_AMBULATORY_CARE_PROVIDER_SITE_OTHER): Payer: Medicare Other | Admitting: Family Medicine

## 2013-12-10 ENCOUNTER — Encounter: Payer: Self-pay | Admitting: Family Medicine

## 2013-12-10 ENCOUNTER — Telehealth: Payer: Self-pay

## 2013-12-10 VITALS — BP 134/70 | HR 80 | Temp 98.1°F | Wt 145.2 lb

## 2013-12-10 DIAGNOSIS — R634 Abnormal weight loss: Secondary | ICD-10-CM

## 2013-12-10 DIAGNOSIS — I350 Nonrheumatic aortic (valve) stenosis: Secondary | ICD-10-CM

## 2013-12-10 DIAGNOSIS — I359 Nonrheumatic aortic valve disorder, unspecified: Secondary | ICD-10-CM

## 2013-12-10 DIAGNOSIS — Z23 Encounter for immunization: Secondary | ICD-10-CM

## 2013-12-10 NOTE — Telephone Encounter (Signed)
Pt is sched to see Dr. Burt Knack in TAVR clinic 12/15/13 @ 4:30.  Left message w/ info on pt's voicemail w/ info and for pt to arrive at 4:15. Asked pt to call back to confirm.

## 2013-12-10 NOTE — Patient Instructions (Signed)
Goal: 3 meals a day, limited salt.  Keep the appointment with cardiology.  If you have redness or heat in your legs, then notify me.

## 2013-12-10 NOTE — Progress Notes (Signed)
Pre visit review using our clinic review tool, if applicable. No additional management support is needed unless otherwise documented below in the visit note.  Weight loss.  Prev labs were unrevealing.   1.5 meals a day, usually only eating at supper, "tonight is fish night."  Discussed getting up to 3 square meals a day.  He's look after a lot of cats at the house and that is making it more difficult to cook.   AS.  He has f/u with TAVR clinic pending.   Still with BLE edema noted, weeping, but not red/hot/tender as prev.    Meds, vitals, and allergies reviewed.   ROS: See HPI.  Otherwise, noncontributory.  nad ncat Mmm rrr with murmur noted.  ctab Scoliotic changes noted.  BLE with 2-3+ edema, L leg weeping but not infected appearing

## 2013-12-11 NOTE — Assessment & Plan Note (Signed)
Likely dietary, d/w pt about getting 3 meals a day, inc in protein.

## 2013-12-11 NOTE — Assessment & Plan Note (Signed)
He has f/u with TAVR clinic.  Wouldn't restart lasix at this point since his legs aren't tight and I don't want to drop his BP.  He agrees. No sign on leg cellulitis.

## 2013-12-15 ENCOUNTER — Encounter: Payer: Self-pay | Admitting: Cardiovascular Disease

## 2013-12-15 ENCOUNTER — Ambulatory Visit (INDEPENDENT_AMBULATORY_CARE_PROVIDER_SITE_OTHER): Payer: Medicare Other | Admitting: Cardiovascular Disease

## 2013-12-15 VITALS — BP 164/70 | HR 70 | Ht 70.0 in | Wt 144.8 lb

## 2013-12-15 DIAGNOSIS — I350 Nonrheumatic aortic (valve) stenosis: Secondary | ICD-10-CM

## 2013-12-15 DIAGNOSIS — I359 Nonrheumatic aortic valve disorder, unspecified: Secondary | ICD-10-CM

## 2013-12-15 NOTE — Progress Notes (Signed)
HPI:   80 year old gentleman referred for evaluation of aortic stenosis. The patient has been followed by Dr. Rockey Situ. He's been physically limited by kyphoscoliosis. The patient has been followed for aortic stenosis now for the past several years. However, a recent echocardiogram showed progression of his aortic stenosis, now in the severe range, with peak and mean gradients of 78 and 47 mm mercury, respectively. He also underwent recent coronary angiography demonstrating mild to moderate proximal LAD stenosis, and no significant disease of the left circumflex and RCA.  The patient describes symptoms of exercise intolerance over the past 6 months to one year. He has difficulty explaining his limitation, but describes weakness and nausea with exertion. He does not feel short of breath and he specifically denies chest pain or pressure. He is not able to do as much physical activity as he has been capable of in the past. He describes increased fatigue and rare occasions of lightheadedness. He has not had frank syncope.  The patient has also had more trouble related to his scoliosis over the past year. He is now on oxycodone and this has helped improve his back pain. He complains of significant weight loss. In the last 1-2 years he has lost approximately 50 pounds which is greater than 25% of his body weight. He's only been eating 1-1-1/2 meals per day. He denies anorexia, abdominal pain, night sweats, fevers, or chills. He attributes some of his weight loss to social factors. He has been caring for about 60 cats, and this takes up much of his time.  The patient is married. He lives in [place]. He is a retired Development worker, community. He quit smoking 50 years ago. He does not drink alcohol. He is here alone today.  Outpatient Encounter Prescriptions as of 12/15/2013  Medication Sig  . aspirin 81 MG EC tablet Take 81 mg by mouth daily.    Marland Kitchen atorvastatin (LIPITOR) 20 MG tablet Take 1 tablet (20 mg total) by mouth at  bedtime.  . finasteride (PROSCAR) 5 MG tablet TAKE ONE TABLET BY MOUTH ONCE DAILY  . furosemide (LASIX) 20 MG tablet Take 1 tablet (20 mg total) by mouth daily as needed for edema.  . metoprolol tartrate (LOPRESSOR) 25 MG tablet Take 1 tablet (25 mg total) by mouth 2 (two) times daily.  . Multiple Vitamin (MULTIVITAMIN) tablet Take 1 tablet by mouth daily.    . multivitamin-lutein (OCUVITE-LUTEIN) CAPS Take 1 capsule by mouth daily.    . Omega-3 Fatty Acids (FISH OIL) 1000 MG CAPS Take by mouth daily.    Marland Kitchen omeprazole (PRILOSEC) 20 MG capsule Take 1 capsule (20 mg total) by mouth daily.  . Oxycodone HCl 10 MG TABS Take 5-10 mg by mouth 3 (three) times daily as needed.  . TRAVATAN Z 0.004 % SOLN ophthalmic solution Place 1 drop into the right eye at bedtime.   . vitamin C (ASCORBIC ACID) 500 MG tablet Take 1,000 mg by mouth daily.   . vitamin E 400 UNIT capsule Take 400 Units by mouth daily.      Review of patient's allergies indicates no known allergies.  Past Medical History  Diagnosis Date  . Hyperlipidemia 09/05/95  . Diverticulosis of colon 06/05/2002  . Benign prostatic hypertrophy 10/18/00  . Hypertension 05/19/03  . CHF (congestive heart failure) 04/21/03  . Scoliosis   . Colon polyps 06/05/2002    path could not be found     Past Surgical History  Procedure Laterality Date  . Knee arthroscopy  06/1997  right  . Colonoscopy w/ biopsies  05/2002  . Flexible sigmoidoscopy  07/2005  . Cardiac catheterization  10/27/2013  . Tee without cardioversion      History   Social History  . Marital Status: Married    Spouse Name: N/A    Number of Children: N/A  . Years of Education: N/A   Occupational History  . retired     [employer]   Social History Main Topics  . Smoking status: Former Smoker -- 1.00 packs/day for 20 years    Types: Cigarettes    Quit date: 01/11/1971  . Smokeless tobacco: Former Systems developer     Comment: quit 40 years ago  . Alcohol Use: No  . Drug  Use: No  . Sexual Activity: No   Other Topics Concern  . Not on file   Social History Narrative   Occupation: Retired Secretary/administrator   Married (second marriage), lives with wife   Ellis Savage '51-'54.     Son died 04-Jul-2008, he had cirrhosis.      Family History  Problem Relation Age of Onset  . Stroke Father   . Diabetes Brother   . Hypertension Other   . Hip fracture Sister     after a fall  . Diabetes Brother   . Coronary artery disease Brother     carotid stenosis  . Colon cancer Neg Hx   . Prostate cancer Neg Hx     ROS:  General: no fevers/chills/night sweats Eyes: no blurry vision, diplopia, or amaurosis ENT: no sore throat or hearing loss Resp: no cough, wheezing, or hemoptysis CV: no palpitations, no chest pain. Positive for edema GI: no abdominal pain, nausea, vomiting, diarrhea, or constipation GU: no dysuria, frequency, or hematuria Skin: no rash Neuro: no headache, numbness, tingling, or weakness of extremities Musculoskeletal: Positive for back pain Heme: no bleeding, DVT, or easy bruising Endo: no polydipsia or polyuria  BP 164/70  Pulse 70  Ht 5\' 10"  (1.778 m)  Wt 144 lb 12.8 oz (65.681 kg)  BMI 20.78 kg/m2  PHYSICAL EXAM: Pt is alert and oriented, WD, WN, elderly male with severe kyphoscoliosis in no distress. HEENT: normal Neck: JVP normal. Carotid upstrokes delayed. No thyromegaly. Lungs: equal expansion, clear bilaterally CV: Apex is discrete and nondisplaced, RRR with loud grade 3/6 late peaking harsh systolic crescendo decrescendo murmur at the right sternal border  Abd: soft, NT, +BS, no bruit, no hepatosplenomegaly Back: no CVA tenderness Ext: 2+ pretibial edema bilaterally        DP/PT pulses intact and = Skin: warm and dry without rash Neuro: CNII-XII intact             Strength intact = bilaterally  2-D echocardiogram 12/02/2013: Left ventricle: The cavity size was normal. There was moderate concentric hypertrophy. Systolic  function was normal. The estimated ejection fraction was in the range of 60% to 65%. Wall motion was normal; there were no regional wall motion abnormalities. Features are consistent with a pseudonormal left ventricular filling pattern, with concomitant abnormal relaxation and increased filling pressure (grade 2 diastolic dysfunction).  ------------------------------------------------------------------- Aortic valve: Trileaflet; moderately thickened, severely calcified leaflets. Mobility was not restricted. Doppler: There was severe stenosis. There was mild regurgitation. VTI ratio of LVOT to aortic valve: 0.21. Valve area (VTI): 0.69 cm^2. Indexed valve area (VTI): 0.39 cm^2/m^2. Peak velocity ratio of LVOT to aortic valve: 0.2. Valve area (Vmax): 0.67 cm^2. Indexed valve area (Vmax): 0.37 cm^2/m^2. Mean velocity ratio of LVOT to aortic  valve: 0.21. Valve area (Vmean): 0.79 cm^2. Indexed valve area (Vmean): 0.44 cm^2/m^2. Mean gradient (S): 47 mm Hg. Peak gradient (S): 78 mm Hg.  ------------------------------------------------------------------- Aorta: Aortic root: The aortic root was normal in size.  ------------------------------------------------------------------- Mitral valve: Structurally normal valve. Mobility was not restricted. Doppler: Transvalvular velocity was within the normal range. There was no evidence for stenosis. There was moderate regurgitation. Peak gradient (D): 7 mm Hg.  ------------------------------------------------------------------- Left atrium: The atrium was mildly to moderately dilated.  ------------------------------------------------------------------- Right ventricle: The cavity size was normal. Wall thickness was normal. Systolic function was normal.  ------------------------------------------------------------------- Pulmonic valve: Doppler: Transvalvular velocity was within the normal range. There was no evidence for stenosis. There was  mild regurgitation.  ------------------------------------------------------------------- Tricuspid valve: Structurally normal valve. Doppler: Transvalvular velocity was within the normal range. There was moderate regurgitation.  ------------------------------------------------------------------- Pulmonary artery: The main pulmonary artery was normal-sized. Systolic pressure was mildly increased.  ------------------------------------------------------------------- Right atrium: The atrium was mildly dilated.  ------------------------------------------------------------------- Pericardium: There was no pericardial effusion.  ------------------------------------------------------------------- Systemic veins: Inferior vena cava: The vessel was normal in size.  STS Risk: Procedure: AV Replacement Risk of Mortality: 2.675% Morbidity or Mortality: 16.437% Long Length of Stay: 6.13% Short Length of Stay: 34.151% Permanent Stroke: 1.645% Prolonged Ventilation: 8.859% DSW Infection: 0.216% Renal Failure: 4.173%    ASSESSMENT AND PLAN: This is an 80 year old male with severe aortic stenosis. He has developed exercise intolerance with vague symptoms, but clearly describes progressive limitation. We discussed in detail the natural history of severe symptomatic aortic stenosis. We reviewed potential treatment options, including palliative medical therapy, conventional surgery, and TAVR. Pros and cons of each approach were reviewed.  The patient's cardiac catheterization films will be requested for review. There is description of mild to moderate proximal LAD stenosis. He has not had any symptoms of angina.  The patient wishes to pursue treatment for his aortic stenosis. I do not think his true risk of open cardiac surgery is markedly underestimated by the STS risk calculator. The patient's major risk factors for significant morbidity include severe kyphoscoliosis and limited mobility as well  as severe protein calorie malnutrition with weight loss > 25% of his bodyweight over the past year.   I have recommended that he undergo a gated cardiac CTA to evaluate his aortic annulus and anatomic characteristics to evaluate for the possibility of TAVR. Also have recommended a CTA of the chest/abdomen/pelvis to evaluate for vascular access. He should have a formal PT assessment as well. After those studies are completed, will refer him to cardiac surgery for formal assessment.    Sherren Mocha MD 12/15/2013 5:02 PM

## 2013-12-15 NOTE — Patient Instructions (Signed)
Your physician recommends that you return for lab work: BMP  Non-Cardiac CT Angiography (CTA), is a special type of CT scan that uses a computer to produce multi-dimensional views of major blood vessels throughout the body. In CT angiography, a contrast material is injected through an IV to help visualize the blood vessels (CTA of chest, abdomen and pelvis)  Your physician has requested that you have a GATED cardiac CT. Cardiac computed tomography (CT) is a painless test that uses an x-ray machine to take clear, detailed pictures of your heart. For further information please visit [email or web address]. Please follow instruction sheet as given.

## 2013-12-17 ENCOUNTER — Encounter: Payer: Self-pay | Admitting: Cardiovascular Disease

## 2013-12-18 ENCOUNTER — Other Ambulatory Visit: Payer: Self-pay | Admitting: Cardiovascular Disease

## 2013-12-18 ENCOUNTER — Other Ambulatory Visit (INDEPENDENT_AMBULATORY_CARE_PROVIDER_SITE_OTHER): Payer: Medicare Other

## 2013-12-18 DIAGNOSIS — I350 Nonrheumatic aortic (valve) stenosis: Secondary | ICD-10-CM

## 2013-12-18 LAB — BASIC METABOLIC PANEL
ANION GAP: 3 — AB (ref 7–16)
BUN: 23 mg/dL — AB (ref 7–18)
CHLORIDE: 105 mmol/L (ref 98–107)
CO2: 34 mmol/L — AB (ref 21–32)
Calcium, Total: 8.6 mg/dL (ref 8.5–10.1)
Creatinine: 0.95 mg/dL (ref 0.60–1.30)
EGFR (African American): >60
EGFR (Non-African Amer.): >60
Glucose: 90 mg/dL (ref 65–99)
Osmolality: 286 (ref 275–301)
Potassium: 4.4 mmol/L (ref 3.5–5.1)
SODIUM: 142 mmol/L (ref 136–145)

## 2013-12-19 ENCOUNTER — Encounter: Payer: Self-pay | Admitting: Thoracic Surgery (Cardiothoracic Vascular Surgery)

## 2013-12-19 ENCOUNTER — Other Ambulatory Visit: Payer: Self-pay | Admitting: *Deleted

## 2013-12-19 ENCOUNTER — Institutional Professional Consult (permissible substitution) (INDEPENDENT_AMBULATORY_CARE_PROVIDER_SITE_OTHER): Payer: Medicare Other | Admitting: Thoracic Surgery (Cardiothoracic Vascular Surgery)

## 2013-12-19 VITALS — BP 182/83 | HR 76 | Resp 16 | Ht 70.0 in | Wt 144.0 lb

## 2013-12-19 DIAGNOSIS — R0602 Shortness of breath: Secondary | ICD-10-CM

## 2013-12-19 DIAGNOSIS — I5032 Chronic diastolic (congestive) heart failure: Secondary | ICD-10-CM

## 2013-12-19 DIAGNOSIS — I350 Nonrheumatic aortic (valve) stenosis: Secondary | ICD-10-CM

## 2013-12-19 DIAGNOSIS — M4120 Other idiopathic scoliosis, site unspecified: Secondary | ICD-10-CM

## 2013-12-19 DIAGNOSIS — M549 Dorsalgia, unspecified: Secondary | ICD-10-CM | POA: Insufficient documentation

## 2013-12-19 DIAGNOSIS — M546 Pain in thoracic spine: Secondary | ICD-10-CM

## 2013-12-19 NOTE — Progress Notes (Signed)
[address]       [place] [address]             [place] REPORT  Referring Provider is Sherren Mocha, MD Primary Cardiologist is Minna Merritts, MD PCP is Elsie Stain, MD  Chief Complaint  Patient presents with  . Aortic Stenosis    HPI:  Patient is an 80 year old male from [place] with history of aortic stenosis, hypertension, hyperlipidemia, chronic back pain with severe kyphoscoliosis of the thoracic spine who has been referred for surgical consultation to discuss treatment options for severe, symptomatic aortic stenosis.  The patient was noted to have a heart murmur on routine physical exam performed many years ago. He has developed progressive aortic stenosis for which she is been followed the past several years by Dr. Rockey Situ in [place]. Over the past year the patient has noticed progressive decline in his exercise tolerance.  He states that he has attributed this to aging, and he reports that his primary limitation remains chronic pain in his back related to severe kyphoscoliosis and arthritis. However, he has also noticed exertional shortness of breath and progressive fatigue. He has lost his appetite and experienced at least 30 pounds weight loss over the past year. He denies any history of chest pain with exertion or at rest.  He denies any history of resting shortness of breath, PND, orthopnea, palpitations, dizzy spells, or syncope. He has developed chronic bilateral lower extremity edema. He states that with normal activity his breathing doesn't limited him too much, but he has cut back his physical activity considerably.  He was seen in followup by Dr. Rockey Situ, and recent transthoracic echocardiogram demonstrates progression of the severity of aortic stenosis with peak velocity across the aortic valve measured in excess of 4 m/s corresponding to peak and mean transvalvular gradients of 78 and 47 mm  mercury, respectively. Left ventricular function remains normal. The patient underwent diagnostic coronary angiography demonstrating what was reported to be "minor luminal irregularities" with "mild to moderate proximal LAD disease".  The patient was initially referred to Dr. Burt Knack who felt that risks associated with conventional surgical aortic valve replacement might be somewhat higher than that predicted using traditional risk models, and the patient has now been referred for surgical consultation.  The patient lives in [place] with his wife.  He has 1 adult son. 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  He states that he spends the majority of his days taking care of nearly 60 cats that his wife looks after. He states that his mobility is limited considerably because of chronic pain in his back. However, he remains functionally independent. He is accompanied by a close friend to the office today for consultation. His wife is not present.  Past Medical History  Diagnosis Date  . Hyperlipidemia 09/05/95  . Diverticulosis of colon 06/05/2002  . Benign prostatic hypertrophy 10/18/00  . Hypertension 05/19/03  . CHF (congestive heart failure) 04/21/03  . Scoliosis   . Colon polyps 06/05/2002    path could not be found   . Chronic diastolic congestive heart failure   . Aortic valve stenosis 09/10/2012  . Back pain     Past Surgical History  Procedure Laterality Date  . Knee arthroscopy  06/1997    right  . Colonoscopy w/ biopsies  05/2002  . Flexible sigmoidoscopy  07/2005  . Cardiac catheterization  10/27/2013  . Tee without cardioversion      Family History  Problem Relation Age of Onset  . Stroke Father   . Diabetes Brother   . Hypertension Other   . Hip fracture Sister     after a fall  . Diabetes Brother   . Coronary artery disease Brother     carotid stenosis  . Colon cancer Neg Hx   . Prostate  cancer Neg Hx     History   Social History  . Marital Status: Married    Spouse Name: N/A    Number of Children: N/A  . Years of Education: N/A   Occupational History  . retired     [employer]   Social History Main Topics  . Smoking status: Former Smoker -- 1.00 packs/day for 20 years    Types: Cigarettes    Quit date: 01/11/1971  . Smokeless tobacco: Former Systems developer     Comment: quit 40 years ago  . Alcohol Use: No  . Drug Use: No  . Sexual Activity: No   Other Topics Concern  . Not on file   Social History Narrative   Occupation: Retired Secretary/administrator   Married (second marriage), lives with wife   Ellis Savage '51-'54.     Son died 2008/06/30, he had cirrhosis.      Current Outpatient Prescriptions  Medication Sig Dispense Refill  . aspirin 81 MG EC tablet Take 81 mg by mouth daily.        Marland Kitchen atorvastatin (LIPITOR) 20 MG tablet Take 1 tablet (20 mg total) by mouth at bedtime.  30 tablet  12  . finasteride (PROSCAR) 5 MG tablet TAKE ONE TABLET BY MOUTH ONCE DAILY  30 tablet  9  . furosemide (LASIX) 20 MG tablet Take 1 tablet (20 mg total) by mouth daily as needed for edema.  30 tablet  1  . metoprolol tartrate (LOPRESSOR) 25 MG tablet Take 1 tablet (25 mg total) by mouth 2 (two) times daily.  60 tablet  6  . Multiple Vitamin (MULTIVITAMIN) tablet Take 1 tablet by mouth daily.        . multivitamin-lutein (OCUVITE-LUTEIN) CAPS Take 1 capsule by mouth daily.        . Omega-3 Fatty Acids (FISH OIL) 1000 MG CAPS Take by mouth daily.        Marland Kitchen omeprazole (PRILOSEC) 20 MG capsule Take 1 capsule (20 mg total) by mouth daily.  30 capsule  3  . Oxycodone HCl 10 MG TABS Take 5-10 mg by mouth 3 (three) times daily as needed.      . TRAVATAN Z 0.004 % SOLN ophthalmic solution Place 1 drop into the right eye at bedtime.       . vitamin C (ASCORBIC ACID) 500 MG tablet Take 1,000 mg by mouth daily.       . vitamin E 400 UNIT capsule Take 400 Units by mouth daily.         No  current facility-administered medications for this visit.    No Known Allergies    Review of Systems:   General:  decreased appetite, decreased energy, no weight gain, + significant weight loss, no fever  Cardiac:  no chest pain with exertion, no chest pain at rest, + SOB with exertion, no resting SOB, no PND, no orthopnea, no palpitations, no arrhythmia, no atrial fibrillation, + LE edema, no dizzy spells, no syncope  Respiratory:  Mild exertional shortness  of breath, no home oxygen, no productive cough, no dry cough, no bronchitis, no wheezing, no hemoptysis, no asthma, no pain with inspiration or cough, no sleep apnea, no CPAP at night  GI:   no difficulty swallowing, no reflux, no frequent heartburn, no hiatal hernia, no abdominal pain, + constipation, no diarrhea, no hematochezia, no hematemesis, no melena  GU:   no dysuria,  no frequency, no urinary tract infection, no hematuria, no enlarged prostate, no kidney stones, no kidney disease  Vascular:  no pain suggestive of claudication, no pain in feet, no leg cramps, + varicose veins, no DVT, no non-healing foot ulcer  Neuro:   no stroke, no TIA's, no seizures, no headaches, no temporary blindness one eye,  no slurred speech, no peripheral neuropathy, + chronic pain, + some instability of gait, no memory/cognitive dysfunction  Musculoskeletal: + arthritis, no joint swelling, no myalgias, + difficulty walking, limited mobility   Skin:   no rash, no itching, no skin infections, no pressure sores or ulcerations  Psych:   no anxiety, no depression, no nervousness, no unusual recent stress  Eyes:   + blurry vision, no floaters, no recent vision changes, + wears glasses or contacts  ENT:   no hearing loss, no loose or painful teeth, + full set dentures  Hematologic:  + easy bruising, no abnormal bleeding, no clotting disorder, no frequent epistaxis  Endocrine:  no diabetes, does not check CBG's at home     Physical Exam:   BP 182/83  Pulse  76  Resp 16  Ht 5\' 10"  (1.778 m)  Wt 144 lb (65.318 kg)  BMI 20.66 kg/m2  SpO2 96%  General:  Thin and somewhat malnourished-appearing  HEENT:  Unremarkable   Neck:   no JVD, no bruits, no adenopathy   Chest:   clear to auscultation, symmetrical breath sounds, no wheezes, no rhonchi   CV:   RRR, grade IV/VI late-peaking crescendo/decrescendo systolic murmur   Abdomen:  soft, non-tender, no masses   Extremities:  warm, well-perfused, pulses diminished but palpable, no LE edema  Rectal/GU  Deferred  Neuro:   Grossly non-focal and symmetrical throughout  Skin:   Clean and dry, no rashes, no breakdown   Diagnostic Tests:  Transthoracic Echocardiography  Patient: Zyden, Suman MR #: [id number] Study Date: 12/02/2013 Gender: M Age: 5 Height: 177.8 cm Weight: 65.3 kg BSA: 1.79 m^2 Pt. Status: Room:  ATTENDING Default, Provider [phone] Dannielle Karvonen, Tim REFERRING Miranda, Tim SONOGRAPHER Timmothy Sours, RDCS, RVT PERFORMING [facility], [facility]  cc:  ------------------------------------------------------------------- LV EF: 60% - 65%  ------------------------------------------------------------------- History: PMH: Previous echocardiogram done 10/01/2012 showed normal LV systolic function with an estimated ejection fraction of 60-65%. there was severe aortic valve stenosis with a mean gradient of 41 mmHg. Mild to moderate mitral valve regurgitation. Pulmonary artery peak systolic pressure was mildly increased at 45 mmHg. Acquired from the patient and from the patient&'s chart. Dyspnea and murmur. Aortic valve disease. Risk factors: Former tobacco use. Hypertension. Dyslipidemia.  ------------------------------------------------------------------- Study Conclusions  - Left ventricle: The cavity size was normal. There was moderate concentric hypertrophy. Systolic function was normal. The estimated ejection fraction was in the range of 60% to 65%. Wall motion was  normal; there were no regional wall motion abnormalities. Features are consistent with a pseudonormal left ventricular filling pattern, with concomitant abnormal relaxation and increased filling pressure (grade 2 diastolic dysfunction). - Aortic valve: There was severe stenosis. There was mild regurgitation. Mean gradient (S): 47 mm Hg. Peak gradient (S):  78 mm Hg. Valve area (VTI): 0.69 cm^2. - Mitral valve: There was moderate regurgitation. - Left atrium: The atrium was mildly to moderately dilated. - Right atrium: The atrium was mildly dilated. - Tricuspid valve: There was moderate regurgitation. - Pulmonary arteries: Systolic pressure was mildly increased. PA peak pressure: 43 mm Hg (S).  Transthoracic echocardiography. M-mode, complete 2D, spectral Doppler, and color Doppler. Birthdate: Patient birthdate: January 20, 1928. Age: Patient is 80 yr old. Sex: Gender: male. BMI: 20.7 kg/m^2. Blood pressure: 140/58 Patient status: Outpatient. Study date: Study date: 12/02/2013. Study time: 11:17 AM.  -------------------------------------------------------------------  ------------------------------------------------------------------- Left ventricle: The cavity size was normal. There was moderate concentric hypertrophy. Systolic function was normal. The estimated ejection fraction was in the range of 60% to 65%. Wall motion was normal; there were no regional wall motion abnormalities. Features are consistent with a pseudonormal left ventricular filling pattern, with concomitant abnormal relaxation and increased filling pressure (grade 2 diastolic dysfunction).  ------------------------------------------------------------------- Aortic valve: Trileaflet; moderately thickened, severely calcified leaflets. Mobility was not restricted. Doppler: There was severe stenosis. There was mild regurgitation. VTI ratio of LVOT to aortic valve: 0.21. Valve area (VTI): 0.69 cm^2. Indexed valve area  (VTI): 0.39 cm^2/m^2. Peak velocity ratio of LVOT to aortic valve: 0.2. Valve area (Vmax): 0.67 cm^2. Indexed valve area (Vmax): 0.37 cm^2/m^2. Mean velocity ratio of LVOT to aortic valve: 0.21. Valve area (Vmean): 0.79 cm^2. Indexed valve area (Vmean): 0.44 cm^2/m^2. Mean gradient (S): 47 mm Hg. Peak gradient (S): 78 mm Hg.  ------------------------------------------------------------------- Aorta: Aortic root: The aortic root was normal in size.  ------------------------------------------------------------------- Mitral valve: Structurally normal valve. Mobility was not restricted. Doppler: Transvalvular velocity was within the normal range. There was no evidence for stenosis. There was moderate regurgitation. Peak gradient (D): 7 mm Hg.  ------------------------------------------------------------------- Left atrium: The atrium was mildly to moderately dilated.  ------------------------------------------------------------------- Right ventricle: The cavity size was normal. Wall thickness was normal. Systolic function was normal.  ------------------------------------------------------------------- Pulmonic valve: Doppler: Transvalvular velocity was within the normal range. There was no evidence for stenosis. There was mild regurgitation.  ------------------------------------------------------------------- Tricuspid valve: Structurally normal valve. Doppler: Transvalvular velocity was within the normal range. There was moderate regurgitation.  ------------------------------------------------------------------- Pulmonary artery: The main pulmonary artery was normal-sized. Systolic pressure was mildly increased.  ------------------------------------------------------------------- Right atrium: The atrium was mildly dilated.  ------------------------------------------------------------------- Pericardium: There was no pericardial  effusion.  ------------------------------------------------------------------- Systemic veins: Inferior vena cava: The vessel was normal in size.  ------------------------------------------------------------------- Measurements  Left ventricle Value Reference LV ID, ED, PLAX chordal 43.8 mm 43 - 52 LV ID, ES, PLAX chordal 27.9 mm 23 - 38 LV fx shortening, PLAX chordal 36 % >=29 LV PW thickness, ED 12.7 mm --------- IVS/LV PW ratio, ED 1.08 <=1.3 Stroke volume, 2D 89 ml --------- Stroke volume/bsa, 2D 50 ml/m^2 --------- LV ejection fraction, 1-p A4C 61 % --------- LV end-diastolic volume, 2-p 91 ml --------- LV end-systolic volume, 2-p 41 ml --------- LV ejection fraction, 2-p 55 % --------- Stroke volume, 2-p 50 ml --------- LV end-diastolic volume/bsa, 2-p 51 ml/m^2 --------- LV end-systolic volume/bsa, 2-p 23 ml/m^2 --------- Stroke volume/bsa, 2-p 27.9 ml/m^2 ---------  Ventricular septum Value Reference IVS thickness, ED 13.7 mm ---------  LVOT Value Reference LVOT ID, S 22 mm --------- LVOT area 3.8 cm^2 --------- LVOT peak velocity, S 77.5 cm/s --------- LVOT mean velocity, S 52.6 cm/s --------- LVOT VTI, S 23.3 cm ---------  Aortic valve Value Reference Aortic valve peak velocity, S 389 cm/s --------- Aortic valve mean velocity, S 254  cm/s --------- Aortic valve VTI, S 113 cm --------- Aortic mean gradient, S 47 mm Hg --------- Aortic peak gradient, S 78 mm Hg --------- VTI ratio, LVOT/AV 0.21 --------- Aortic valve area, VTI 0.69 cm^2 --------- Aortic valve area/bsa, VTI 0.39 cm^2/m^2 --------- Velocity ratio, peak, LVOT/AV 0.2 --------- Aortic valve area, peak velocity 0.67 cm^2 --------- Aortic valve area/bsa, peak 0.37 cm^2/m^2 --------- velocity Velocity ratio, mean, LVOT/AV 0.21 --------- Aortic valve area, mean velocity 0.79 cm^2 --------- Aortic valve area/bsa, mean 0.44 cm^2/m^2 --------- velocity  Aorta Value Reference Aortic root ID, ED 30 mm  ---------  Left atrium Value Reference LA ID, A-P, ES 44 mm --------- LA ID/bsa, A-P (H) 2.46 cm/m^2 <=2.2 LA volume, S 90 ml --------- LA volume/bsa, S 50.3 ml/m^2 ---------  Mitral valve Value Reference Mitral E-wave peak velocity 133 cm/s --------- Mitral A-wave peak velocity 81.9 cm/s --------- Mitral deceleration time 190 ms 150 - 230 Mitral peak gradient, D 7 mm Hg --------- Mitral E/A ratio, peak 1.6 --------- Mitral maximal regurg velocity, 745 cm/s --------- PISA Mitral regurg VTI, PISA 299 cm --------- Mitral ERO, PISA 0.05 cm^2 --------- Mitral regurg volume, PISA 15 ml ---------  Pulmonary arteries Value Reference PA pressure, S, DP (H) 43 mm Hg <=30  Tricuspid valve Value Reference Tricuspid peak RV-RA gradient 38 mm Hg --------- Tricuspid maximal regurg 309 cm/s --------- velocity, PISA  Right ventricle Value Reference RV ID, ED, PLAX 32.7 mm 19 - 38  Pulmonic valve Value Reference Pulmonic valve peak velocity, S 91 cm/s --------- Pulmonic regurg velocity, ED 145 cm/s --------- Pulmonic regurg gradient, ED 8 mm Hg ---------  Legend: (L) and (H) mark values outside specified reference range.  ------------------------------------------------------------------- Prepared and Electronically Authenticated by  Kathlyn Sacramento, MD 2015-09-15T17:30:05    CARDIAC CATHETERIZATION  Report from cardiac catheterization performed 11/13/2013 at [facility] is reviewed. Images from this report are not currently available for review. The patient underwent diagnostic cardiac catheterization without left ventriculogram or management of transvalvular gradient across the aortic valve. Right heart catheterization was not performed. By report "coronary angiography demonstrated minor luminal irregularities" with "mild to moderate proximal LAD disease"    STS Risk Calculator  Procedure    AVR  Risk of Mortality   3.4% Morbidity or Mortality  18.9% Prolonged LOS   7.8% Short  LOS    29.5% Permanent Stroke   2.1% Prolonged Vent Support  11.0% DSW Infection    0.3% Renal Failure    4.9% Reoperation    8.5%   Impression:  Patient has stage D severe symptomatic aortic stenosis.  I have personally reviewed the patient's most recent transthoracic echocardiogram. The patient clearly has severe calcific aortic stenosis with severe thickening, calcification and restricted leaflet mobility involving all 3 leaflets of his aortic valve.  Left ventricular systolic function appears normal. There is moderate (2+) central mitral regurgitation. By report the patient does not have significant coronary artery disease. Associated comorbid medical problems include severe kyphoscoliosis with chronic back pain and somewhat limited physical mobility. The patient has lost at least 30 pounds in weight over the past year and appears likely malnourished. Risks associated with conventional surgical aortic valve replacement may be somewhat higher than that predicted using traditional risk model such as the STS risk calculator, but overall risks may still be only in the moderate to high-risk category.   Plan:  The patient and his friend were counseled at length regarding treatment alternatives for management of severe symptomatic aortic stenosis. The natural history of aortic  stenosis was discussed at length, and alternative approaches were compared and contrasted including conventional aortic valve replacement, transcatheter aortic valve replacement, and long term medical therapy.  The risks associated with conventional surgical aortic valve replacement were been discussed in detail, as were expectations for post-operative convalescence. Long-term prognosis with medical therapy was discussed. This discussion was placed in the context of the patient's own specific clinical presentation and past medical history.  All of their questions been addressed.  We plan to proceed with pulmonary function testing,  physical therapy consultation, and CT angiography of the chest, abdomen, and pelvis to better stratify the patient's risks associated with conventional surgery and potential evaluate possible alternative sites for vascular access should transcatheter aortic valve replacement proved to be an attractive alternative. The patient will return in 2 weeks to review the results of these diagnostic tests and discuss options further.  During the interim period of time we'll obtain a copy of the patient's recent cardiac catheterization to directly review images.    I spent in excess of 90 minutes during the conduct of this office consultation and >50% of this time involved direct face-to-face encounter with the patient for counseling and/or coordination of their care.   Valentina Gu. Roxy Manns, MD 12/19/2013 11:34 AM

## 2014-01-01 ENCOUNTER — Ambulatory Visit (HOSPITAL_COMMUNITY)
Admission: RE | Admit: 2014-01-01 | Discharge: 2014-01-01 | Disposition: A | Discharge status: Home / Self Care | Payer: Medicare Other | Source: Ambulatory Visit | Attending: Cardiovascular Disease | Admitting: Cardiovascular Disease

## 2014-01-01 ENCOUNTER — Ambulatory Visit (HOSPITAL_COMMUNITY)
Admission: RE | Admit: 2014-01-01 | Discharge: 2014-01-01 | Disposition: A | Discharge status: Home / Self Care | Payer: Medicare Other | Source: Ambulatory Visit | Attending: Thoracic Surgery (Cardiothoracic Vascular Surgery) | Admitting: Thoracic Surgery (Cardiothoracic Vascular Surgery)

## 2014-01-01 ENCOUNTER — Ambulatory Visit (HOSPITAL_COMMUNITY): Payer: Medicare Other

## 2014-01-01 DIAGNOSIS — I350 Nonrheumatic aortic (valve) stenosis: Secondary | ICD-10-CM

## 2014-01-01 LAB — PULMONARY FUNCTION TEST
DL/VA % pred: 91 %
DL/VA: 3.95 ml/min/mmHg/L
DLCO UNC % PRED: 74 %
DLCO UNC: 20.13 ml/min/mmHg
DLCO cor % pred: 74 %
DLCO cor: 20.13 ml/min/mmHg
FEF 25-75 POST: 2.98 L/s
FEF 25-75 PRE: 2.18 L/s
FEF2575-%Change-Post: 36 %
FEF2575-%Pred-Post: 217 %
FEF2575-%Pred-Pre: 159 %
FEV1-%Change-Post: 7 %
FEV1-%Pred-Post: 116 %
FEV1-%Pred-Pre: 108 %
FEV1-POST: 2.52 L
FEV1-Pre: 2.34 L
FEV1FVC-%Change-Post: 3 %
FEV1FVC-%Pred-Pre: 113 %
FEV6-%CHANGE-POST: 2 %
FEV6-%PRED-PRE: 100 %
FEV6-%Pred-Post: 102 %
FEV6-POST: 2.98 L
FEV6-Pre: 2.91 L
FEV6FVC-%PRED-POST: 109 %
FEV6FVC-%Pred-Pre: 109 %
FVC-%Change-Post: 3 %
FVC-%PRED-POST: 94 %
FVC-%PRED-PRE: 91 %
FVC-PRE: 2.91 L
FVC-Post: 3.01 L
POST FEV6/FVC RATIO: 100 %
Post FEV1/FVC ratio: 84 %
Pre FEV1/FVC ratio: 81 %
Pre FEV6/FVC Ratio: 100 %
RV % pred: 133 %
RV: 3.4 L
TLC % pred: 96 %
TLC: 6.03 L

## 2014-01-01 MED ORDER — METOPROLOL TARTRATE 1 MG/ML IV SOLN
INTRAVENOUS | Status: AC
  Filled 2014-01-01: qty 5

## 2014-01-01 MED ORDER — IOHEXOL 350 MG/ML SOLN
80.0000 mL | Freq: Once | INTRAVENOUS | Status: AC | PRN
  Administered 2014-01-01: 160 mL via INTRAVENOUS

## 2014-01-01 MED ORDER — METOPROLOL TARTRATE 1 MG/ML IV SOLN
5.0000 mg | Freq: Once | INTRAVENOUS | Status: AC
  Administered 2014-01-01: 5 mg via INTRAVENOUS
  Filled 2014-01-01: qty 5

## 2014-01-01 MED ORDER — METOPROLOL TARTRATE 1 MG/ML IV SOLN
10.0000 mg | INTRAVENOUS | Status: DC | PRN
  Administered 2014-01-01: 10 mg via INTRAVENOUS
  Filled 2014-01-01: qty 10

## 2014-01-01 MED ORDER — ALBUTEROL SULFATE (2.5 MG/3ML) 0.083% IN NEBU
2.5000 mg | INHALATION_SOLUTION | Freq: Once | RESPIRATORY_TRACT | Status: AC
  Administered 2014-01-01: 2.5 mg via RESPIRATORY_TRACT

## 2014-01-01 MED ORDER — METOPROLOL TARTRATE 1 MG/ML IV SOLN
INTRAVENOUS | Status: AC
  Administered 2014-01-01: 10 mg via INTRAVENOUS
  Filled 2014-01-01: qty 10

## 2014-01-05 ENCOUNTER — Ambulatory Visit (INDEPENDENT_AMBULATORY_CARE_PROVIDER_SITE_OTHER): Payer: Medicare Other | Admitting: Family Medicine

## 2014-01-05 ENCOUNTER — Encounter: Payer: Self-pay | Admitting: Family Medicine

## 2014-01-05 VITALS — BP 160/84 | HR 76 | Temp 97.5°F | Wt 140.8 lb

## 2014-01-05 DIAGNOSIS — I10 Essential (primary) hypertension: Secondary | ICD-10-CM

## 2014-01-05 MED ORDER — METOPROLOL TARTRATE 25 MG PO TABS: 25.0000 mg | ORAL_TABLET | Freq: Two times a day (BID) | ORAL | Status: DC

## 2014-01-05 NOTE — Assessment & Plan Note (Signed)
Restart metoprolol and use lasix only if sig edema. He agrees.  See instructions.  I'll await final reports from recently testing.

## 2014-01-05 NOTE — Progress Notes (Signed)
Pre visit review using our clinic review tool, if applicable. No additional management support is needed unless otherwise documented below in the visit note.  BP was elevated while at [facility] for preop testing. SBP >200 per patient.  Off lasix since swelling wasn't severe, not recurrent recently.  Off metoprolol in the meantime. Had been held prev.    He's working on increasing his mealtime intake, discussed.    Not SOB, no CP, no BLE edema.  Fatigue noted by patient.    Meds, vitals, and allergies reviewed.   ROS: See HPI.  Otherwise, noncontributory.  Tired appearing but in nad Mmm Rrr, murmur noted ctab abd soft Ext w/o edema  Obvious scoliosis noted.

## 2014-01-05 NOTE — Patient Instructions (Signed)
Start back on metoprolol 25mg  twice a day.  If you are getting light headed, then cut the dose in half (down to 1/2 a pill twice a day).  Take care.  I'll await the final report from the recent testing. Glad to see you.

## 2014-01-06 ENCOUNTER — Telehealth: Payer: Self-pay | Admitting: Family Medicine

## 2014-01-06 NOTE — Telephone Encounter (Signed)
emmi mailed  °

## 2014-01-12 ENCOUNTER — Ambulatory Visit: Payer: Medicare Other | Admitting: Thoracic Surgery (Cardiothoracic Vascular Surgery)

## 2014-01-19 ENCOUNTER — Other Ambulatory Visit: Payer: Self-pay | Admitting: *Deleted

## 2014-01-19 ENCOUNTER — Ambulatory Visit (INDEPENDENT_AMBULATORY_CARE_PROVIDER_SITE_OTHER): Payer: Medicare Other | Admitting: Thoracic Surgery (Cardiothoracic Vascular Surgery)

## 2014-01-19 ENCOUNTER — Other Ambulatory Visit: Payer: Self-pay

## 2014-01-19 ENCOUNTER — Encounter: Payer: Self-pay | Admitting: Thoracic Surgery (Cardiothoracic Vascular Surgery)

## 2014-01-19 VITALS — BP 105/56 | HR 62 | Ht 70.0 in | Wt 140.0 lb

## 2014-01-19 DIAGNOSIS — I350 Nonrheumatic aortic (valve) stenosis: Secondary | ICD-10-CM

## 2014-01-19 DIAGNOSIS — Q230 Congenital stenosis of aortic valve: Secondary | ICD-10-CM

## 2014-01-19 DIAGNOSIS — I5032 Chronic diastolic (congestive) heart failure: Secondary | ICD-10-CM

## 2014-01-19 LAB — CBC
HCT: 33.7 % — ABNORMAL LOW (ref 39.0–52.0)
Hemoglobin: 11.8 g/dL — ABNORMAL LOW (ref 13.0–17.0)
MCH: 30 pg (ref 26.0–34.0)
MCHC: 35 g/dL (ref 30.0–36.0)
MCV: 85.8 fL (ref 78.0–100.0)
PLATELETS: 151 10*3/uL (ref 150–400)
RBC: 3.93 MIL/uL — AB (ref 4.22–5.81)
RDW: 13.9 % (ref 11.5–15.5)
WBC: 4.9 10*3/uL (ref 4.0–10.5)

## 2014-01-19 NOTE — Progress Notes (Signed)
[address]       [place] [address]             [phone]     CARDIOTHORACIC SURGERY OFFICE NOTE  Referring Provider is Sherren Mocha, MD  Primary Cardiologist is Minna Merritts, MD PCP is Elsie Stain, MD   HPI:  Patient returns to the office today for follow-up of severe symptomatic aortic stenosis. He was initially seen in consultation on 12/19/2013.  Since then he has undergone pulmonary function testing and CT angiography to discern whether or not he might be candidate for transcatheter aortic valve replacement as an alternative to high risk conventional surgery. He has not yet been evaluated normally by physical therapy. He reports no new problems or complaints over the last few weeks.   Current Outpatient Prescriptions  Medication Sig Dispense Refill  . aspirin 81 MG EC tablet Take 81 mg by mouth daily.      Marland Kitchen atorvastatin (LIPITOR) 20 MG tablet Take 1 tablet (20 mg total) by mouth at bedtime. 30 tablet 12  . finasteride (PROSCAR) 5 MG tablet TAKE ONE TABLET BY MOUTH ONCE DAILY 30 tablet 9  . furosemide (LASIX) 20 MG tablet Take 1 tablet (20 mg total) by mouth daily as needed for edema. 30 tablet 1  . metoprolol tartrate (LOPRESSOR) 25 MG tablet Take 1 tablet (25 mg total) by mouth 2 (two) times daily. 60 tablet 6  . Multiple Vitamin (MULTIVITAMIN) tablet Take 1 tablet by mouth daily.      . multivitamin-lutein (OCUVITE-LUTEIN) CAPS Take 1 capsule by mouth daily.      . Omega-3 Fatty Acids (FISH OIL) 1000 MG CAPS Take by mouth daily.      Marland Kitchen omeprazole (PRILOSEC) 20 MG capsule Take 1 capsule (20 mg total) by mouth daily. 30 capsule 3  . TRAVATAN Z 0.004 % SOLN ophthalmic solution Place 1 drop into the right eye at bedtime.     . vitamin C (ASCORBIC ACID) 500 MG tablet Take 1,000 mg by mouth daily.     . vitamin E 400 UNIT capsule Take 400 Units by mouth daily.      . Oxycodone HCl 10 MG TABS Take 5-10 mg by mouth 3 (three) times daily as  needed.     No current facility-administered medications for this visit.      Physical Exam:   BP 105/56 mmHg  Pulse 62  Ht 5\' 10"  (1.778 m)  Wt 140 lb (63.504 kg)  BMI 20.09 kg/m2  SpO2 96%  General:  Chronically debilitated and ill-appearing in no distress  Chest:   Clear to auscultation  CV:   Regular rate and rhythm with late peaking crescendo decrescendo systolic murmur  Incisions:  n/a  Abdomen:  Soft and nontender  Extremities:  Warm and well-perfused  Diagnostic Tests:  Transthoracic Echocardiography  Patient: Douglas Parker, Douglas Parker MR #: [id number] Study Date: 12/02/2013 Gender: M Age: 80 Height: 177.8 cm Weight: 65.3 kg BSA: 1.79 m^2 Pt. Status: Room:  ATTENDING Default, Provider [phone] Dannielle Karvonen, Tim REFERRING [place], Tim SONOGRAPHER Timmothy Sours, RDCS, RVT PERFORMING [facility], [place]  cc:  ------------------------------------------------------------------- LV EF: 60% - 65%  ------------------------------------------------------------------- History: PMH: Previous echocardiogram done 10/01/2012 showed normal LV systolic function with an estimated ejection fraction of 60-65%. there was severe aortic valve stenosis with a mean gradient of 41 mmHg. Mild to moderate mitral valve regurgitation. Pulmonary artery peak systolic pressure was mildly increased at 45 mmHg. Acquired from the patient  and from the patient&'s chart. Dyspnea and murmur. Aortic valve disease. Risk factors: Former tobacco use. Hypertension. Dyslipidemia.  ------------------------------------------------------------------- Study Conclusions  - Left ventricle: The cavity size was normal. There was moderate concentric hypertrophy. Systolic function was normal. The estimated ejection fraction was in the range of 60% to 65%. Wall motion was normal; there were no regional wall motion abnormalities. Features are consistent with a pseudonormal left ventricular filling pattern, with  concomitant abnormal relaxation and increased filling pressure (grade 2 diastolic dysfunction). - Aortic valve: There was severe stenosis. There was mild regurgitation. Mean gradient (S): 47 mm Hg. Peak gradient (S): 78 mm Hg. Valve area (VTI): 0.69 cm^2. - Mitral valve: There was moderate regurgitation. - Left atrium: The atrium was mildly to moderately dilated. - Right atrium: The atrium was mildly dilated. - Tricuspid valve: There was moderate regurgitation. - Pulmonary arteries: Systolic pressure was mildly increased. PA peak pressure: 43 mm Hg (S).  Transthoracic echocardiography. M-mode, complete 2D, spectral Doppler, and color Doppler. Birthdate: Patient birthdate: [birth year]. Age: Patient is 80 yr old. Sex: Gender: male. BMI: 20.7 kg/m^2. Blood pressure: 140/58 Patient status: Outpatient. Study date: Study date: 12/02/2013. Study time: 11:17 AM.  -------------------------------------------------------------------  ------------------------------------------------------------------- Left ventricle: The cavity size was normal. There was moderate concentric hypertrophy. Systolic function was normal. The estimated ejection fraction was in the range of 60% to 65%. Wall motion was normal; there were no regional wall motion abnormalities. Features are consistent with a pseudonormal left ventricular filling pattern, with concomitant abnormal relaxation and increased filling pressure (grade 2 diastolic dysfunction).  ------------------------------------------------------------------- Aortic valve: Trileaflet; moderately thickened, severely calcified leaflets. Mobility was not restricted. Doppler: There was severe stenosis. There was mild regurgitation. VTI ratio of LVOT to aortic valve: 0.21. Valve area (VTI): 0.69 cm^2. Indexed valve area (VTI): 0.39 cm^2/m^2. Peak velocity ratio of LVOT to aortic valve: 0.2. Valve area (Vmax): 0.67 cm^2. Indexed valve area (Vmax): 0.37 cm^2/m^2.  Mean velocity ratio of LVOT to aortic valve: 0.21. Valve area (Vmean): 0.79 cm^2. Indexed valve area (Vmean): 0.44 cm^2/m^2. Mean gradient (S): 47 mm Hg. Peak gradient (S): 78 mm Hg.  ------------------------------------------------------------------- Aorta: Aortic root: The aortic root was normal in size.  ------------------------------------------------------------------- Mitral valve: Structurally normal valve. Mobility was not restricted. Doppler: Transvalvular velocity was within the normal range. There was no evidence for stenosis. There was moderate regurgitation. Peak gradient (D): 7 mm Hg.  ------------------------------------------------------------------- Left atrium: The atrium was mildly to moderately dilated.  ------------------------------------------------------------------- Right ventricle: The cavity size was normal. Wall thickness was normal. Systolic function was normal.  ------------------------------------------------------------------- Pulmonic valve: Doppler: Transvalvular velocity was within the normal range. There was no evidence for stenosis. There was mild regurgitation.  ------------------------------------------------------------------- Tricuspid valve: Structurally normal valve. Doppler: Transvalvular velocity was within the normal range. There was moderate regurgitation.  ------------------------------------------------------------------- Pulmonary artery: The main pulmonary artery was normal-sized. Systolic pressure was mildly increased.  ------------------------------------------------------------------- Right atrium: The atrium was mildly dilated.  ------------------------------------------------------------------- Pericardium: There was no pericardial effusion.  ------------------------------------------------------------------- Systemic veins: Inferior vena cava: The vessel was normal in  size.  ------------------------------------------------------------------- Measurements  Left ventricle Value Reference LV ID, ED, PLAX chordal 43.8 mm 43 - 52 LV ID, ES, PLAX chordal 27.9 mm 23 - 38 LV fx shortening, PLAX chordal 36 % >=29 LV PW thickness, ED 12.7 mm --------- IVS/LV PW ratio, ED 1.08 <=1.3 Stroke volume, 2D 89 ml --------- Stroke volume/bsa, 2D 50 ml/m^2 --------- LV ejection fraction, 1-p A4C 61 % --------- LV end-diastolic volume, 2-p 91 ml ---------  LV end-systolic volume, 2-p 41 ml --------- LV ejection fraction, 2-p 55 % --------- Stroke volume, 2-p 50 ml --------- LV end-diastolic volume/bsa, 2-p 51 ml/m^2 --------- LV end-systolic volume/bsa, 2-p 23 ml/m^2 --------- Stroke volume/bsa, 2-p 27.9 ml/m^2 ---------  Ventricular septum Value Reference IVS thickness, ED 13.7 mm ---------  LVOT Value Reference LVOT ID, S 22 mm --------- LVOT area 3.8 cm^2 --------- LVOT peak velocity, S 77.5 cm/s --------- LVOT mean velocity, S 52.6 cm/s --------- LVOT VTI, S 23.3 cm ---------  Aortic valve Value Reference Aortic valve peak velocity, S 389 cm/s --------- Aortic valve mean velocity, S 254 cm/s --------- Aortic valve VTI, S 113 cm --------- Aortic mean gradient, S 47 mm Hg --------- Aortic peak gradient, S 78 mm Hg --------- VTI ratio, LVOT/AV 0.21 --------- Aortic valve area, VTI 0.69 cm^2 --------- Aortic valve area/bsa, VTI 0.39 cm^2/m^2 --------- Velocity ratio, peak, LVOT/AV 0.2 --------- Aortic valve area, peak velocity 0.67 cm^2 --------- Aortic valve area/bsa, peak 0.37 cm^2/m^2 --------- velocity Velocity ratio, mean, LVOT/AV 0.21 --------- Aortic valve area, mean velocity 0.79 cm^2 --------- Aortic valve area/bsa, mean 0.44 cm^2/m^2 --------- velocity  Aorta Value Reference Aortic root ID, ED 30 mm ---------  Left atrium Value Reference LA ID, A-P, ES 44 mm --------- LA ID/bsa, A-P (H) 2.46 cm/m^2 <=2.2 LA volume, S 90 ml --------- LA  volume/bsa, S 50.3 ml/m^2 ---------  Mitral valve Value Reference Mitral E-wave peak velocity 133 cm/s --------- Mitral A-wave peak velocity 81.9 cm/s --------- Mitral deceleration time 190 ms 150 - 230 Mitral peak gradient, D 7 mm Hg --------- Mitral E/A ratio, peak 1.6 --------- Mitral maximal regurg velocity, 745 cm/s --------- PISA Mitral regurg VTI, PISA 299 cm --------- Mitral ERO, PISA 0.05 cm^2 --------- Mitral regurg volume, PISA 15 ml ---------  Pulmonary arteries Value Reference PA pressure, S, DP (H) 43 mm Hg <=30  Tricuspid valve Value Reference Tricuspid peak RV-RA gradient 38 mm Hg --------- Tricuspid maximal regurg 309 cm/s --------- velocity, PISA  Right ventricle Value Reference RV ID, ED, PLAX 32.7 mm 19 - 38  Pulmonic valve Value Reference Pulmonic valve peak velocity, S 91 cm/s --------- Pulmonic regurg velocity, ED 145 cm/s --------- Pulmonic regurg gradient, ED 8 mm Hg ---------  Legend: (L) and (H) mark values outside specified reference range.  ------------------------------------------------------------------- Prepared and Electronically Authenticated by  Kathlyn Sacramento, MD 2015-09-15T17:30:05    CARDIAC CATHETERIZATION  Both the report and images from cardiac catheterization performed 11/13/2013 at [facility] are reviewed.The patient underwent diagnostic cardiac catheterization without left ventriculogram or management of transvalvular gradient across the aortic valve. Right heart catheterization was not performed. The patient has moderate nonobstructive coronary artery disease without any significant flow limiting lesions.     Cardiac TAVR CT  TECHNIQUE: The patient was scanned on a Philips 256 scanner. A 120 kV retrospective scan was triggered in the descending thoracic aorta at 111 HU's. Gantry rotation speed was 270 msecs and collimation was .9 mm. 15 mg of iv Metoprolol and no nitro were given. The 3D data set was reconstructed in 5%  intervals of the R-R cycle. Systolic and diastolic phases were analyzed on a dedicated work station using MPR, MIP and VRT modes. The patient received 80 cc of contrast.  FINDINGS: Aortic Valve: Trileaflet, moderately calcified leaflet with moderately restricted leaflet separation.  Aorta: Normal size of the aortic root and thoracic aorta. There are mild diffuse calcifications in the ascending and descending aorta and moderate calcifications around the origin of the brachiocephalic arteries.  Sinotubular Junction: 29 x 28 mm  Ascending Thoracic Aorta: 34 x 32 mm  Aortic Arch: 27 x 25 mm  Descending Thoracic Aorta: 25 x 25 mm  Sinus of Valsalva Measurements:  Non-coronary: 30 mm  Right -coronary: 29 mm  Left -coronary: 32 mm  Coronary Artery Height above Annulus:  Left Main: 11 mm  Right Coronary: 13 mm  Virtual Basal Annulus Measurements:  Maximum/Minimum Diameter: 29 x 24 mm  Perimeter: 87 mm  Area: 530 mm2  Coronary Arteries: Normal origin. Right and left co-dominance.  RCA gives rise to PDA. No plague.  LM has distal calcifications continuing into proximal and mid LAD associated with 25-50% stenosis at the proximal LAD then 0-25% stenosis.  D1 has no plaque.  LCX gives rise to 3 OMs and PLVB. There is no plaque.  Optimum Fluoroscopic Angle for Delivery: LAO 10 CAU 14  Annulus to sheath tip distance: 68 mm  IMPRESSION: 1. Trileaflet, moderately calcified aortic valve with moderately restricted leaflet separation and measurements suitable for delivery of hard 26 mm Edward-SAPIEN XT TAVR.  2. Non-obstructive CAD.  3. Optimum Fluoroscopic Angle for Delivery: LAO 10 CAU [place]   Electronically Signed  By: Ena Dawley  On: 01/04/2014 08:08      Study Result     EXAM: OVER-READ INTERPRETATION CT CHEST  The following report is an over-read performed by radiologist  Dr. Norlene Duel [facility] on 01/01/2014. This over-read does not include interpretation of cardiac or coronary anatomy or pathology. The coronary calcium score/coronary CTA interpretation by the cardiologist is attached.  COMPARISON: None.  FINDINGS: Bilateral calcified pleural plaques are identified compatible with previous asbestos exposure. There is a moderate volume right pleural effusion present. Nodule within the left base measures 4 mm. No airspace consolidation identified. Within the right upper lobe there is a nodule measuring 4 mm, image 18/series 412.  No mediastinal or hilar adenopathy identified. There is no axillary adenopathy noted.  Incidental imaging through the upper abdomen shows several cysts within the liver which appears similar to CT the abdomen dated 10/17/2013. Several small low attenuation structures are again noted within the the visualized portions of the adrenal glands are unremarkable. There is a scoliosis deformity involving the thoracic spine with multi level degenerative disc disease.  IMPRESSION: 1. Calcified pleural plaques compatible with prior asbestos exposure. 2. Right pleural effusion 3. 4 mm nodule identified within the right upper lobe and left base. If the patient is at high risk for bronchogenic carcinoma, follow-up chest CT at 1year is recommended. If the patient is at low risk, no follow-up is needed. This recommendation follows the consensus statement: Guidelines for Management of Small Pulmonary Nodules Detected on CT Scans: A Statement from the [place] as published in Radiology 2005; 237:395-400. 4. Scoliosis and multi level degenerative disc disease. 5. Liver cysts.  Electronically Signed: By: Kerby Moors M.D. On: 01/01/2014 11:11        CTA ABDOMEN AND PELVIS WITHOUT AND WITH CONTRAST  TECHNIQUE: Multidetector CT imaging of the abdomen and pelvis was performed using the  standard protocol during bolus administration of intravenous contrast. Multiplanar reconstructed images and MIPs were obtained and reviewed to evaluate the vascular anatomy.  CONTRAST: 160 mL of Omnipaque 350.  COMPARISON: CT of the abdomen and pelvis 10/17/2013.  FINDINGS: CT ABDOMEN AND PELVIS FINDINGS  Lower chest: Calcified pleural plaques in the thorax bilaterally (left greater than right). Moderate right-sided pleural effusion incompletely visualized, layering dependently. Areas of mild  scarring and/or subsegmental atelectasis in the lung bases bilaterally. Calcifications of the mitral annulus.  Hepatobiliary: Multiple hepatic lesions, the majority of which are too small to definitively characterize. The larger lesions are all compatible with simple cysts, with the largest of these cysts measuring up to 5.0 x 6.4 cm in the central aspect of segment 4A of the liver. No intra or extrahepatic biliary ductal dilatation. Gallbladder is normal in appearance.  Pancreas: Pancreas is normal in appearance.  Spleen: Sub cm low-attenuation lesion in the spleen is too small to characterize, but has a benign appearance.  Adrenals/Urinary Tract: Adrenal glands are normal bilaterally. Right kidney is normal in appearance. Sub cm lesion in the lower pole of the left kidney is too small to definitively characterize, but measures approximately 156 HU, which may suggest an enhancing lesion. 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 Urinary bladder is normal in appearance.  Stomach/Bowel: Stomach is largely decompressed, but unremarkable in appearance. No pathologic dilatation of the small bowel or colon.  Vascular/Lymphatic: Extensive atherosclerosis throughout the abdominal and pelvic vasculature, with vascular findings and measurements pertinent to  potential TAVR procedure, as detailed below. No pathologically enlarged lymph nodes are noted in the abdomen or pelvis.  Reproductive: Prostate gland is unremarkable in appearance.  Other: Small volume of ascites in the low anatomic pelvis is intermediate attenuation, suggesting proteinaceous contents. No pneumoperitoneum.  Musculoskeletal: S-shaped thoracolumbar scoliosis convex to the right near the thoracolumbar junction and to the left in the lumbar region. There are no aggressive appearing lytic or blastic lesions noted in the visualized portions of the skeleton.  VASCULAR MEASUREMENTS PERTINENT TO TAVR:  AORTA:  Minimal Aortic Diameter - 11 x 15 mm  Severity of Aortic Calcification - moderate to severe  RIGHT PELVIS:  Right Common Iliac Artery -  Minimal Diameter - 5.6 x 7.0 mm  Tortuosity - mild  Calcification - moderate-severe  Right External Iliac Artery -  Minimal Diameter - 7.9 x 8.1 mm  Tortuosity - mild-moderate  Calcification - mild  Right Common Femoral Artery -  Minimal Diameter - 2.8 x 7.2 mm in the mid to distal vessel  Tortuosity - mild  Calcification - moderate  (more proximally the right common femoral artery measures 8.8 x 6.2 mm)  LEFT PELVIS:  Left Common Iliac Artery -  Minimal Diameter - 7.0 x 9.4 mm  Tortuosity - mild  Calcification - moderate  Left External Iliac Artery -  Minimal Diameter - 8.7 x 8.6 mm  Tortuosity - mild  Calcification - mild  Left Common Femoral Artery -  Minimal Diameter - 8.2 x 4.2 mm in the mid to distal vessel  Tortuosity - mild  Calcification - mild-moderate  (more proximally the left common femoral artery measures 6.7 x 7.5 mm)  Review of the MIP images confirms the above findings.  IMPRESSION: 1. Vascular findings and measurements pertinent to potential TAVR procedure, as detailed above. This patient may have suitable pelvic arterial  access on either side, however, attention to the measurements and findings in the common femoral arteries is recommended, as the mid to distal common femoral arteries are compromised by eccentric calcified plaque bilaterally (right worse than left), while the proximal aspects of the vessels are more suitable for arterial access perhaps via cutdown. 2. Moderate right-sided pleural effusion incompletely visualized. This appears to be simple, layering dependently. 3. Calcified pleural  plaques bilaterally, suggestive of asbestos related pleural disease. 4. Sub cm lesion in the lower pole the left kidney is too small to definitively characterize, but measures approximately 156 HU, which may suggest an enhancing lesion. Further characterization with nonemergent MRI of the abdomen with and without IV gadolinium is suggested in the near future for more definitive evaluation. 5. Multiple hepatic lesions, the majority of which are too small to definitively characterize. The larger of these lesions are compatible with simple cysts, in the smaller lesions also favored to represent multiple small cysts. 6. Small volume of intermediate attenuation ascites, presumably proteinaceous fluid. This is of uncertain etiology and significance. 7. Additional incidental findings, as above.   Electronically Signed  By: Vinnie Langton M.D.  On: 01/01/2014 16:18   Pulmonary Function Tests  Baseline      Post-bronchodilator  FVC  2.91 L  (91% predicted) FVC  3.01 L  (94% predicted) FEV1  2.34 L  (108% predicted) FEV1  2.52 L  (116% predicted) FEF25-75 2.18 L  (159% predicted) FEF25-75 2.98 L  (217% predicted)  TLC  6.03 L  (96% predicted) RV  3.40 L  (133% predicted) DLCO  74% predicted    STS Risk Calculator  ProcedureAVR  Risk of Mortality3.4% Morbidity or Mortality18.9% Prolonged  LOS7.8% Short LOS29.5% Permanent Stroke2.1% Prolonged Vent Support11.0% DSW Infection0.3% Renal Failure4.9% Reoperation8.5%     Impression:  Patient has stage D severe symptomatic aortic stenosis. I have personally reviewed the patient's most recent transthoracic echocardiogram, diagnostic catheterization and CT angiograms. The patient clearly has severe calcific aortic stenosis with severe thickening, calcification and restricted leaflet mobility involving all 3 leaflets of his aortic valve. Left ventricular systolic function appears normal. There is moderate (2+) central mitral regurgitation. He does not have significant coronary artery disease.  Associated comorbid medical problems include severe kyphoscoliosis with chronic back pain and somewhat limited physical mobility. The patient has lost at least 30 pounds in weight over the past year and appears very malnourished. I feel that risks associated with conventional surgical aortic valve replacement may be considerably higher than that predicted using traditional risk model such as the STS risk calculator, with anticipated risk of mortality approaching 15% and risk of significant morbidity potentially in excess of 50%.  CT angiography reveals that the patient has anatomical characteristics that appear relatively favorable for transcatheter aortic valve replacement, potentially via transfemoral approach on the left side using the relatively low profile Edwards Sapien 3 transcatheter heart valve.    Plan:  The patient and his close friend were counseled at length regarding treatment alternatives for management of severe symptomatic aortic stenosis. Alternative approaches such as conventional aortic valve  replacement, transcatheter aortic valve replacement, and palliative medical therapy were compared and contrasted at length.  The risks associated with conventional surgical aortic valve replacement were been discussed in detail, as were expectations for post-operative convalescence. Long-term prognosis with medical therapy was discussed. This discussion was placed in the context of the patient's own specific clinical presentation and past medical history.  The patient hopes to proceed with transcatheter aortic valve replacement as soon as practical as an alternative to high risk conventional surgical aortic valve replacement.  Following the decision to proceed with transcatheter aortic valve replacement, a discussion has been held regarding what types of management strategies would be attempted intraoperatively in the event of life-threatening complications, including whether or not the patient would be considered a candidate for the use of cardiopulmonary bypass and/or conversion to open sternotomy for  attempted surgical intervention.  The patient has been advised of a variety of complications that might develop including but not limited to risks of death, stroke, paravalvular leak, aortic dissection or other major vascular complications, aortic annulus rupture, device embolization, cardiac rupture or perforation, mitral regurgitation, acute myocardial infarction, arrhythmia, heart block or bradycardia requiring permanent pacemaker placement, congestive heart failure, respiratory failure, renal failure, pneumonia, infection, other late complications related to structural valve deterioration or migration, or other complications that might ultimately cause a temporary or permanent loss of functional independence or other long term morbidity.  The patient provides full informed consent for the procedure as described and all questions were answered.   I spent in excess of 30 minutes during the conduct of this office  consultation and >50% of this time involved direct face-to-face encounter with the patient for counseling and/or coordination of their care.  Valentina Gu. Roxy Manns, MD 01/19/2014 2:05 PM

## 2014-01-20 LAB — PREALBUMIN: PREALBUMIN: 14.1 mg/dL — AB (ref 17.0–34.0)

## 2014-01-20 LAB — COMPREHENSIVE METABOLIC PANEL
ALBUMIN: 3.9 g/dL (ref 3.5–5.2)
AST: 14 U/L (ref 0–37)
Alkaline Phosphatase: 49 U/L (ref 39–117)
BUN: 21 mg/dL (ref 6–23)
CALCIUM: 8.8 mg/dL (ref 8.4–10.5)
CHLORIDE: 101 meq/L (ref 96–112)
CO2: 33 meq/L — AB (ref 19–32)
Creat: 0.91 mg/dL (ref 0.50–1.35)
Glucose, Bld: 87 mg/dL (ref 70–99)
Potassium: 4.4 mEq/L (ref 3.5–5.3)
Sodium: 140 mEq/L (ref 135–145)
TOTAL PROTEIN: 6 g/dL (ref 6.0–8.3)
Total Bilirubin: 0.7 mg/dL (ref 0.2–1.2)

## 2014-01-28 ENCOUNTER — Institutional Professional Consult (permissible substitution) (INDEPENDENT_AMBULATORY_CARE_PROVIDER_SITE_OTHER): Payer: Medicare Other | Admitting: Surgery

## 2014-01-28 ENCOUNTER — Encounter: Payer: Self-pay | Admitting: Surgery

## 2014-01-28 VITALS — BP 136/72 | HR 68 | Resp 16 | Ht 70.0 in | Wt 138.0 lb

## 2014-01-28 DIAGNOSIS — I5032 Chronic diastolic (congestive) heart failure: Secondary | ICD-10-CM

## 2014-01-28 DIAGNOSIS — I350 Nonrheumatic aortic (valve) stenosis: Secondary | ICD-10-CM

## 2014-01-29 ENCOUNTER — Encounter: Payer: Self-pay | Admitting: Surgery

## 2014-01-29 NOTE — Progress Notes (Signed)
Patient ID: Douglas Boyden Sr., male   DOB: Jul 06, 1927, 80 y.o.   MRN: [id number]   [facility] REPORT  Referring Provider is Sherren Mocha, MD PCP is Elsie Stain, MD  Chief Complaint  Patient presents with  . Aortic Stenosis    TAVR CONSULT    HPI:  He is an 80 year old gentleman with a history of aortic stenosis, hypertension, hyperlipidemia, and chronic back pain with severe kyphoscoliosis of the thoracic spine who has been referred for surgical consultation to consider transcatheter aortic valve replacement for severe symptomatic aortic stenosis. Over the past year the patient has noticed a progressive decline in his exercise tolerance.He reports that his primary limitation remains chronic pain in his back related to severe kyphoscoliosis and arthritis. He has also noticed exertional shortness of breath and progressive fatigue. He has lost his appetite and experienced at least 30 pounds weight loss over the past year. He denies any history of chest pain with exertion or at rest. He denies any history of resting shortness of breath, PND, orthopnea, palpitations, dizzy spells, or syncope. He has developed chronic bilateral lower extremity edema. He states that with normal activity his breathing has not limited him too much, but he has cut back his physical activity considerably to avoid symptoms.  He was seen in followup by Dr. Rockey Situ, and recent transthoracic echocardiogram demonstrates progression of the severity of aortic stenosis with peak velocity across the aortic valve measured in excess of 4 m/s corresponding to peak and mean transvalvular gradients of 78 and 47 mm mercury, respectively. Left ventricular function remains normal. The patient underwent diagnostic coronary angiography demonstrating mild non-obstructive disease.   Past Medical History  Diagnosis Date  . Hyperlipidemia  09/05/95  . Diverticulosis of colon 06/05/2002  . Benign prostatic hypertrophy 10/18/00  . Hypertension 05/19/03  . CHF (congestive heart failure) 04/21/03  . Scoliosis   . Colon polyps 06/05/2002    path could not be found   . Chronic diastolic congestive heart failure   . Aortic valve stenosis 09/10/2012  . Back pain     Past Surgical History  Procedure Laterality Date  . Knee arthroscopy  06/1997    right  . Colonoscopy w/ biopsies  05/2002  . Flexible sigmoidoscopy  07/2005  . Cardiac catheterization  10/27/2013  . Tee without cardioversion      Family History  Problem Relation Age of Onset  . Stroke Father   . Diabetes Brother   . Hypertension Other   . Hip fracture Sister     after a fall  . Diabetes Brother   . Coronary artery disease Brother     carotid stenosis  . Colon cancer Neg Hx   . Prostate cancer Neg Hx     History   Social History  . Marital Status: Married    Spouse Name: N/A    Number of Children: N/A  . Years of Education: N/A   Occupational History  . retired     [employer]   Social History Main Topics  . Smoking status: Former Smoker -- 1.00 packs/day for 20 years    Types: Cigarettes    Quit date: 01/11/1971  . Smokeless tobacco: Former Systems developer     Comment: quit 40 years ago  . Alcohol Use: No  . Drug Use: No  . Sexual Activity: No   Other Topics Concern  . Not on file  Social History Narrative   Occupation: Retired Secretary/administrator   Married (second marriage), lives with wife   Ellis Savage '51-'54.     Son died 07-12-08, he had cirrhosis.      Current Outpatient Prescriptions  Medication Sig Dispense Refill  . aspirin 81 MG EC tablet Take 81 mg by mouth daily.      Marland Kitchen atorvastatin (LIPITOR) 20 MG tablet Take 1 tablet (20 mg total) by mouth at bedtime. 30 tablet 12  . furosemide (LASIX) 20 MG tablet Take 1 tablet (20 mg total) by mouth daily as needed for edema. 30 tablet 1  . metoprolol tartrate (LOPRESSOR) 25 MG tablet Take  1 tablet (25 mg total) by mouth 2 (two) times daily. 60 tablet 6  . Multiple Vitamin (MULTIVITAMIN) tablet Take 1 tablet by mouth daily.      . multivitamin-lutein (OCUVITE-LUTEIN) CAPS Take 1 capsule by mouth daily.      . Omega-3 Fatty Acids (FISH OIL) 1000 MG CAPS Take 1,000 mg by mouth daily.     Marland Kitchen omeprazole (PRILOSEC) 20 MG capsule Take 1 capsule (20 mg total) by mouth daily. 30 capsule 3  . Oxycodone HCl 10 MG TABS Take 5-10 mg by mouth 3 (three) times daily as needed (for pain).     . TRAVATAN Z 0.004 % SOLN ophthalmic solution Place 1 drop into the right eye at bedtime.     . vitamin C (ASCORBIC ACID) 500 MG tablet Take 1,000 mg by mouth daily.     . vitamin E 400 UNIT capsule Take 400 Units by mouth daily.      . finasteride (PROSCAR) 5 MG tablet Take 5 mg by mouth daily.     No current facility-administered medications for this visit.    No Known Allergies    Review of Systems:  General:decreased appetite, decreased energy, no weight gain, + significant weight loss, no fever Cardiac:no chest pain with exertion, no chest pain at rest, + SOB with exertion, no resting SOB, no PND, no orthopnea, no palpitations, no arrhythmia, no atrial fibrillation, + LE edema, no dizzy spells, no syncope Respiratory:Mild exertional shortness of breath, no home oxygen, no productive cough, no dry cough, no bronchitis, no wheezing, no hemoptysis, no asthma, no pain with inspiration or cough, no sleep apnea, no CPAP at night GI:no difficulty swallowing, no reflux, no frequent heartburn, no hiatal hernia, no abdominal pain, + constipation, no diarrhea, no hematochezia, no hematemesis, no melena GU:no dysuria, no frequency, no urinary tract infection, no hematuria, no enlarged prostate, no kidney stones, no kidney  disease Vascular:no pain suggestive of claudication, no pain in feet, no leg cramps, + varicose veins, no DVT, no non-healing foot ulcer Neuro:no stroke, no TIA's, no seizures, no headaches, no temporary blindness one eye, no slurred speech, no peripheral neuropathy, + chronic pain, + some instability of gait, no memory/cognitive dysfunction Musculoskeletal:+ arthritis, no joint swelling, no myalgias, + difficulty walking, limited mobility  Skin:no rash, no itching, no skin infections, no pressure sores or ulcerations Psych:no anxiety, no depression, no nervousness, no unusual recent stress Eyes:+ blurry vision, no floaters, no recent vision changes, + wears glasses or contacts ENT:no hearing loss, no loose or painful teeth, + full set dentures Hematologic:+ easy bruising, no abnormal bleeding, no clotting disorder, no frequent epistaxis Endocrine:no diabetes, does not check CBG's at home           Physical Exam:   BP 136/72 mmHg  Pulse 68  Resp 16  Ht 5'  10" (1.778 m)  Wt 138 lb (62.596 kg)  BMI 19.80 kg/m2  SpO2 97%  General:             Frail-appearing thin gentleman in no distress  HEENT:  NCAT, PERLA, EOMI, oropharynx clear.  Neck:   no JVD, no bruits, no adenopathy or thyromegaly  Chest:   clear to auscultation, symmetrical breath sounds, no wheezes, no rhonchi   CV:   RRR, grade III/VI crescendo/decrescendo murmur heard best at RSB,  no diastolic murmur  Abdomen:  soft, non-tender, no masses or organomegaly  Extremities:  warm, well-perfused, pulses palpable, no LE edema  Rectal/GU  Deferred  Neuro:   Grossly non-focal and symmetrical throughout  Skin:   Clean and dry, no  rashes, no breakdown   Diagnostic Tests:  Transthoracic Echocardiography  Patient: Kalev, Temme MR #: [id number] Study Date: 12/02/2013 Gender: M Age: 52 Height: 177.8 cm Weight: 65.3 kg BSA: 1.79 m^2 Pt. Status: Room:  ATTENDING Default, Provider [phone] Dannielle Karvonen, Tim REFERRING [place], Tim SONOGRAPHER Timmothy Sours, RDCS, RVT PERFORMING [facility] [place]  cc:  ------------------------------------------------------------------- LV EF: 60% - 65%  ------------------------------------------------------------------- History: PMH: Previous echocardiogram done 10/01/2012 showed normal LV systolic function with an estimated ejection fraction of 60-65%. there was severe aortic valve stenosis with a mean gradient of 41 mmHg. Mild to moderate mitral valve regurgitation. Pulmonary artery peak systolic pressure was mildly increased at 45 mmHg. Acquired from the patient and from the patient&'s chart. Dyspnea and murmur. Aortic valve disease. Risk factors: Former tobacco use. Hypertension. Dyslipidemia.  ------------------------------------------------------------------- Study Conclusions  - Left ventricle: The cavity size was normal. There was moderate concentric hypertrophy. Systolic function was normal. The estimated ejection fraction was in the range of 60% to 65%. Wall motion was normal; there were no regional wall motion abnormalities. Features are consistent with a pseudonormal left ventricular filling pattern, with concomitant abnormal relaxation and increased filling pressure (grade 2 diastolic dysfunction). - Aortic valve: There was severe stenosis. There was mild regurgitation. Mean gradient (S): 47 mm Hg. Peak gradient (S): 78 mm Hg. Valve area (VTI): 0.69 cm^2. - Mitral valve: There was moderate regurgitation. - Left atrium: The atrium was mildly to moderately dilated. - Right atrium: The atrium was mildly dilated. - Tricuspid valve: There was moderate  regurgitation. - Pulmonary arteries: Systolic pressure was mildly increased. PA peak pressure: 43 mm Hg (S).  Transthoracic echocardiography. M-mode, complete 2D, spectral Doppler, and color Doppler. Birthdate: Patient birthdate: [birth year]. Age: Patient is 80 yr old. Sex: Gender: male. BMI: 20.7 kg/m^2. Blood pressure: 140/58 Patient status: Outpatient. Study date: Study date: 12/02/2013. Study time: 11:17 AM.  -------------------------------------------------------------------  ------------------------------------------------------------------- Left ventricle: The cavity size was normal. There was moderate concentric hypertrophy. Systolic function was normal. The estimated ejection fraction was in the range of 60% to 65%. Wall motion was normal; there were no regional wall motion abnormalities. Features are consistent with a pseudonormal left ventricular filling pattern, with concomitant abnormal relaxation and increased filling pressure (grade 2 diastolic dysfunction).  ------------------------------------------------------------------- Aortic valve: Trileaflet; moderately thickened, severely calcified leaflets. Mobility was not restricted. Doppler: There was severe stenosis. There was mild regurgitation. VTI ratio of LVOT to aortic valve: 0.21. Valve area (VTI): 0.69 cm^2. Indexed valve area (VTI): 0.39 cm^2/m^2. Peak velocity ratio of LVOT to aortic valve: 0.2. Valve area (Vmax): 0.67 cm^2. Indexed valve area (Vmax): 0.37 cm^2/m^2. Mean velocity ratio of LVOT to aortic valve: 0.21. Valve area (Vmean): 0.79 cm^2. Indexed valve area (Vmean): 0.44 cm^2/m^2. Mean gradient (  S): 47 mm Hg. Peak gradient (S): 78 mm Hg.  ------------------------------------------------------------------- Aorta: Aortic root: The aortic root was normal in size.  ------------------------------------------------------------------- Mitral valve: Structurally normal valve. Mobility was not restricted.  Doppler: Transvalvular velocity was within the normal range. There was no evidence for stenosis. There was moderate regurgitation. Peak gradient (D): 7 mm Hg.  ------------------------------------------------------------------- Left atrium: The atrium was mildly to moderately dilated.  ------------------------------------------------------------------- Right ventricle: The cavity size was normal. Wall thickness was normal. Systolic function was normal.  ------------------------------------------------------------------- Pulmonic valve: Doppler: Transvalvular velocity was within the normal range. There was no evidence for stenosis. There was mild regurgitation.  ------------------------------------------------------------------- Tricuspid valve: Structurally normal valve. Doppler: Transvalvular velocity was within the normal range. There was moderate regurgitation.  ------------------------------------------------------------------- Pulmonary artery: The main pulmonary artery was normal-sized. Systolic pressure was mildly increased.  ------------------------------------------------------------------- Right atrium: The atrium was mildly dilated.  ------------------------------------------------------------------- Pericardium: There was no pericardial effusion.  ------------------------------------------------------------------- Systemic veins: Inferior vena cava: The vessel was normal in size.  ------------------------------------------------------------------- Measurements  Left ventricle Value Reference LV ID, ED, PLAX chordal 43.8 mm 43 - 52 LV ID, ES, PLAX chordal 27.9 mm 23 - 38 LV fx shortening, PLAX chordal 36 % >=29 LV PW thickness, ED 12.7 mm --------- IVS/LV PW ratio, ED 1.08 <=1.3 Stroke volume, 2D 89 ml --------- Stroke volume/bsa, 2D 50 ml/m^2 --------- LV ejection fraction, 1-p A4C 61 % --------- LV end-diastolic volume, 2-p 91 ml --------- LV end-systolic  volume, 2-p 41 ml --------- LV ejection fraction, 2-p 55 % --------- Stroke volume, 2-p 50 ml --------- LV end-diastolic volume/bsa, 2-p 51 ml/m^2 --------- LV end-systolic volume/bsa, 2-p 23 ml/m^2 --------- Stroke volume/bsa, 2-p 27.9 ml/m^2 ---------  Ventricular septum Value Reference IVS thickness, ED 13.7 mm ---------  LVOT Value Reference LVOT ID, S 22 mm --------- LVOT area 3.8 cm^2 --------- LVOT peak velocity, S 77.5 cm/s --------- LVOT mean velocity, S 52.6 cm/s --------- LVOT VTI, S 23.3 cm ---------  Aortic valve Value Reference Aortic valve peak velocity, S 389 cm/s --------- Aortic valve mean velocity, S 254 cm/s --------- Aortic valve VTI, S 113 cm --------- Aortic mean gradient, S 47 mm Hg --------- Aortic peak gradient, S 78 mm Hg --------- VTI ratio, LVOT/AV 0.21 --------- Aortic valve area, VTI 0.69 cm^2 --------- Aortic valve area/bsa, VTI 0.39 cm^2/m^2 --------- Velocity ratio, peak, LVOT/AV 0.2 --------- Aortic valve area, peak velocity 0.67 cm^2 --------- Aortic valve area/bsa, peak 0.37 cm^2/m^2 --------- velocity Velocity ratio, mean, LVOT/AV 0.21 --------- Aortic valve area, mean velocity 0.79 cm^2 --------- Aortic valve area/bsa, mean 0.44 cm^2/m^2 --------- velocity  Aorta Value Reference Aortic root ID, ED 30 mm ---------  Left atrium Value Reference LA ID, A-P, ES 44 mm --------- LA ID/bsa, A-P (H) 2.46 cm/m^2 <=2.2 LA volume, S 90 ml --------- LA volume/bsa, S 50.3 ml/m^2 ---------  Mitral valve Value Reference Mitral E-wave peak velocity 133 cm/s --------- Mitral A-wave peak velocity 81.9 cm/s --------- Mitral deceleration time 190 ms 150 - 230 Mitral peak gradient, D 7 mm Hg --------- Mitral E/A ratio, peak 1.6 --------- Mitral maximal regurg velocity, 745 cm/s --------- PISA Mitral regurg VTI, PISA 299 cm --------- Mitral ERO, PISA 0.05 cm^2 --------- Mitral regurg volume, PISA 15 ml ---------  Pulmonary arteries Value  Reference PA pressure, S, DP (H) 43 mm Hg <=30  Tricuspid valve Value Reference Tricuspid peak RV-RA gradient 38 mm Hg --------- Tricuspid maximal regurg 309 cm/s --------- velocity, PISA  Right ventricle Value Reference RV ID, ED, PLAX 32.7 mm  19 - 38  Pulmonic valve Value Reference Pulmonic valve peak velocity, S 91 cm/s --------- Pulmonic regurg velocity, ED 145 cm/s --------- Pulmonic regurg gradient, ED 8 mm Hg ---------  Legend: (L) and (H) mark values outside specified reference range.  ------------------------------------------------------------------- Prepared and Electronically Authenticated by  Kathlyn Sacramento, MD 2015-09-15T17:30:05   CARDIAC CATHETERIZATION  Report from cardiac catheterization performed 11/13/2013 at [facility]. The patient underwent diagnostic cardiac catheterization without left ventriculogram or measurement of transvalvular gradient across the aortic valve. Right heart catheterization was not performed. By report "coronary angiography demonstrated minor luminal irregularities" with "mild to moderate proximal LAD disease."  Cardiac TAVR CT  TECHNIQUE: The patient was scanned on a Philips 256 scanner. A 120 kV retrospective scan was triggered in the descending thoracic aorta at 111 HU's. Gantry rotation speed was 270 msecs and collimation was .9 mm. 15 mg of iv Metoprolol and no nitro were given. The 3D data set was reconstructed in 5% intervals of the R-R cycle. Systolic and diastolic phases were analyzed on a dedicated work station using MPR, MIP and VRT modes. The patient received 80 cc of contrast.  FINDINGS: Aortic Valve: Trileaflet, moderately calcified leaflet with moderately restricted leaflet separation.  Aorta: Normal size of the aortic root and thoracic aorta. There are mild diffuse calcifications in the ascending and descending aorta and moderate calcifications around the origin of the brachiocephalic arteries.  Sinotubular  Junction: 29 x 28 mm  Ascending Thoracic Aorta: 34 x 32 mm  Aortic Arch: 27 x 25 mm  Descending Thoracic Aorta: 25 x 25 mm  Sinus of Valsalva Measurements:  Non-coronary: 30 mm  Right -coronary: 29 mm  Left -coronary: 32 mm  Coronary Artery Height above Annulus:  Left Main: 11 mm  Right Coronary: 13 mm  Virtual Basal Annulus Measurements:  Maximum/Minimum Diameter: 29 x 24 mm  Perimeter: 87 mm  Area: 530 mm2  Coronary Arteries: Normal origin. Right and left co-dominance.  RCA gives rise to PDA. No plague.  LM has distal calcifications continuing into proximal and mid LAD associated with 25-50% stenosis at the proximal LAD then 0-25% stenosis.  D1 has no plaque.  LCX gives rise to 3 OMs and PLVB. There is no plaque.  Optimum Fluoroscopic Angle for Delivery: LAO 10 CAU 14  Annulus to sheath tip distance: 68 mm  IMPRESSION: 1. Trileaflet, moderately calcified aortic valve with moderately restricted leaflet separation and measurements suitable for delivery of hard 26 mm Edward-SAPIEN XT TAVR.  2. Non-obstructive CAD.  3. Optimum Fluoroscopic Angle for Delivery: LAO 10 CAU [place]   Electronically Signed  By: Ena Dawley  On: 01/04/2014 08:08      Study Result     EXAM: OVER-READ INTERPRETATION CT CHEST  The following report is an over-read performed by radiologist Dr. Norlene Duel [facility] on 01/01/2014. This over-read does not include interpretation of cardiac or coronary anatomy or pathology. The coronary calcium score/coronary CTA interpretation by the cardiologist is attached.  COMPARISON: None.  FINDINGS: Bilateral calcified pleural plaques are identified compatible with previous asbestos exposure. There is a moderate volume right pleural effusion present. Nodule within the left base measures 4 mm. No airspace consolidation identified.  Within the right upper lobe there is a nodule measuring 4 mm, image 18/series 412.  No mediastinal or hilar adenopathy identified. There is no axillary adenopathy noted.  Incidental imaging through the upper abdomen shows several cysts within the liver which appears similar to CT the abdomen dated  10/17/2013. Several small low attenuation structures are again noted within the the visualized portions of the adrenal glands are unremarkable. There is a scoliosis deformity involving the thoracic spine with multi level degenerative disc disease.  IMPRESSION: 1. Calcified pleural plaques compatible with prior asbestos exposure. 2. Right pleural effusion 3. 4 mm nodule identified within the right upper lobe and left base. If the patient is at high risk for bronchogenic carcinoma, follow-up chest CT at 1year is recommended. If the patient is at low risk, no follow-up is needed. This recommendation follows the consensus statement: Guidelines for Management of Small Pulmonary Nodules Detected on CT Scans: A Statement from the [employer] as published in Radiology 2005; 237:395-400. 4. Scoliosis and multi level degenerative disc disease. 5. Liver cysts.  Electronically Signed: By: Kerby Moors M.D. On: 01/01/2014 11:11        CTA ABDOMEN AND PELVIS WITHOUT AND WITH CONTRAST  TECHNIQUE: Multidetector CT imaging of the abdomen and pelvis was performed using the standard protocol during bolus administration of intravenous contrast. Multiplanar reconstructed images and MIPs were obtained and reviewed to evaluate the vascular anatomy.  CONTRAST: 160 mL of Omnipaque 350.  COMPARISON: CT of the abdomen and pelvis 10/17/2013.  FINDINGS: CT ABDOMEN AND PELVIS FINDINGS  Lower chest: Calcified pleural plaques in the thorax bilaterally (left greater than right). Moderate right-sided pleural effusion incompletely visualized, layering dependently. Areas of  mild scarring and/or subsegmental atelectasis in the lung bases bilaterally. Calcifications of the mitral annulus.  Hepatobiliary: Multiple hepatic lesions, the majority of which are too small to definitively characterize. The larger lesions are all compatible with simple cysts, with the largest of these cysts measuring up to 5.0 x 6.4 cm in the central aspect of segment 4A of the liver. No intra or extrahepatic biliary ductal dilatation. Gallbladder is normal in appearance.  Pancreas: Pancreas is normal in appearance.  Spleen: Sub cm low-attenuation lesion in the spleen is too small to characterize, but has a benign appearance.  Adrenals/Urinary Tract: Adrenal glands are normal bilaterally. Right kidney is normal in appearance. Sub cm lesion in the lower pole of the left kidney is too small to definitively characterize, but measures approximately 156 HU, which may suggest an enhancing lesion. 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 Urinary bladder is normal in appearance.  Stomach/Bowel: Stomach is largely decompressed, but unremarkable in appearance. No pathologic dilatation of the small bowel or colon.  Vascular/Lymphatic: Extensive atherosclerosis throughout the abdominal and pelvic vasculature, with vascular findings and measurements pertinent to potential TAVR procedure, as detailed below. No pathologically enlarged lymph nodes are noted in the abdomen or pelvis.  Reproductive: Prostate gland is unremarkable in appearance.  Other: Small volume of ascites in the low anatomic pelvis is intermediate attenuation, suggesting proteinaceous contents. No pneumoperitoneum.  Musculoskeletal: S-shaped thoracolumbar scoliosis convex to the right near the thoracolumbar junction and to the left in the lumbar region. There are no aggressive  appearing lytic or blastic lesions noted in the visualized portions of the skeleton.  VASCULAR MEASUREMENTS PERTINENT TO TAVR:  AORTA:  Minimal Aortic Diameter - 11 x 15 mm  Severity of Aortic Calcification - moderate to severe  RIGHT PELVIS:  Right Common Iliac Artery -  Minimal Diameter - 5.6 x 7.0 mm  Tortuosity - mild  Calcification - moderate-severe  Right External Iliac Artery -  Minimal Diameter - 7.9 x 8.1  mm  Tortuosity - mild-moderate  Calcification - mild  Right Common Femoral Artery -  Minimal Diameter - 2.8 x 7.2 mm in the mid to distal vessel  Tortuosity - mild  Calcification - moderate  (more proximally the right common femoral artery measures 8.8 x 6.2 mm)  LEFT PELVIS:  Left Common Iliac Artery -  Minimal Diameter - 7.0 x 9.4 mm  Tortuosity - mild  Calcification - moderate  Left External Iliac Artery -  Minimal Diameter - 8.7 x 8.6 mm  Tortuosity - mild  Calcification - mild  Left Common Femoral Artery -  Minimal Diameter - 8.2 x 4.2 mm in the mid to distal vessel  Tortuosity - mild  Calcification - mild-moderate  (more proximally the left common femoral artery measures 6.7 x 7.5 mm)  Review of the MIP images confirms the above findings.  IMPRESSION: 1. Vascular findings and measurements pertinent to potential TAVR procedure, as detailed above. This patient may have suitable pelvic arterial access on either side, however, attention to the measurements and findings in the common femoral arteries is recommended, as the mid to distal common femoral arteries are compromised by eccentric calcified plaque bilaterally (right worse than left), while the proximal aspects of the vessels are more suitable for arterial access perhaps via cutdown. 2. Moderate right-sided pleural effusion incompletely visualized. This appears to be simple, layering dependently. 3. Calcified pleural plaques  bilaterally, suggestive of asbestos related pleural disease. 4. Sub cm lesion in the lower pole the left kidney is too small to definitively characterize, but measures approximately 156 HU, which may suggest an enhancing lesion. Further characterization with nonemergent MRI of the abdomen with and without IV gadolinium is suggested in the near future for more definitive evaluation. 5. Multiple hepatic lesions, the majority of which are too small to definitively characterize. The larger of these lesions are compatible with simple cysts, in the smaller lesions also favored to represent multiple small cysts. 6. Small volume of intermediate attenuation ascites, presumably proteinaceous fluid. This is of uncertain etiology and significance. 7. Additional incidental findings, as above.   Electronically Signed  By: Vinnie Langton M.D.  On: 01/01/2014 16:18   STS Risk Calculator  ProcedureAVR  Risk of Mortality3.4% Morbidity or Mortality18.9% Prolonged LOS7.8% Short LOS29.5% Permanent Stroke2.1% Prolonged Vent Support11.0% DSW Infection0.3% Renal Failure4.9% Reoperation8.5%   Impression:  The patient has severe symptomatic aortic stenosis with normal left ventricular function. By report the patient does not have significant coronary artery disease. He would be at very high risk for conventional open surgical AVR due to severe kyphoscoliosis and limited mobility as well as severe protein calorie malnutrition with weight loss > 25% of his bodyweight over the past year. I agree that the risks associated with conventional surgical aortic valve  replacement would be considerably higher than that predicted using the STS risk calculator, with anticipated risk of mortality approaching 15% and risk of significant morbidity potentially in excess of 50%. CT angiography reveals that the patient has anatomical characteristics that appear relatively favorable for transcatheter aortic valve replacement, potentially via transfemoral approach on the left side using the relatively low profile Edwards Sapien 3 transcatheter heart valve. The patient and his close friend were counseled at length regarding treatment alternatives for management of severe symptomatic aortic stenosis. Alternative approaches such as conventional aortic valve replacement, transcatheter aortic valve replacement, and palliative medical therapy were compared and contrasted at length. The risks associated with conventional surgical aortic valve replacement were been discussed in detail, as  were expectations for post-operative convalescence. Long-term prognosis with medical therapy was discussed. He would like to proceed with TAVR.   Plan:  He will be scheduled for TAVR on Tuesday 02/03/2014.    Gaye Pollack, MD 01/28/2014

## 2014-01-30 ENCOUNTER — Ambulatory Visit (HOSPITAL_COMMUNITY)
Admission: RE | Admit: 2014-01-30 | Discharge: 2014-01-30 | Disposition: A | Discharge status: Home / Self Care | Payer: Medicare Other | Source: Ambulatory Visit | Attending: Cardiovascular Disease | Admitting: Cardiovascular Disease

## 2014-01-30 ENCOUNTER — Encounter (HOSPITAL_COMMUNITY)
Admission: RE | Admit: 2014-01-30 | Discharge: 2014-01-30 | Disposition: A | Discharge status: Home / Self Care | Payer: Medicare Other | Source: Ambulatory Visit | Attending: Cardiovascular Disease | Admitting: Cardiovascular Disease

## 2014-01-30 ENCOUNTER — Encounter (HOSPITAL_COMMUNITY): Payer: Self-pay

## 2014-01-30 DIAGNOSIS — Z87891 Personal history of nicotine dependence: Secondary | ICD-10-CM | POA: Insufficient documentation

## 2014-01-30 DIAGNOSIS — I350 Nonrheumatic aortic (valve) stenosis: Secondary | ICD-10-CM | POA: Diagnosis not present

## 2014-01-30 DIAGNOSIS — Z01810 Encounter for preprocedural cardiovascular examination: Secondary | ICD-10-CM | POA: Insufficient documentation

## 2014-01-30 HISTORY — DX: Malignant (primary) neoplasm, unspecified: C80.1

## 2014-01-30 HISTORY — DX: Unspecified osteoarthritis, unspecified site: M19.90

## 2014-01-30 HISTORY — DX: Changes in skin texture: R23.4

## 2014-01-30 LAB — CBC
HEMATOCRIT: 34 % — AB (ref 39.0–52.0)
HEMOGLOBIN: 11.7 g/dL — AB (ref 13.0–17.0)
MCH: 31 pg (ref 26.0–34.0)
MCHC: 34.4 g/dL (ref 30.0–36.0)
MCV: 90.2 fL (ref 78.0–100.0)
Platelets: 123 10*3/uL — ABNORMAL LOW (ref 150–400)
RBC: 3.77 MIL/uL — ABNORMAL LOW (ref 4.22–5.81)
RDW: 13.4 % (ref 11.5–15.5)
WBC: 5.6 10*3/uL (ref 4.0–10.5)

## 2014-01-30 LAB — URINALYSIS, ROUTINE W REFLEX MICROSCOPIC
Glucose, UA: NEGATIVE mg/dL
Hgb urine dipstick: NEGATIVE
Ketones, ur: NEGATIVE mg/dL
Leukocytes, UA: NEGATIVE
Nitrite: NEGATIVE
Protein, ur: 30 mg/dL — AB
Specific Gravity, Urine: 1.026 (ref 1.005–1.030)
Urobilinogen, UA: 1 mg/dL (ref 0.0–1.0)
pH: 5.5 (ref 5.0–8.0)

## 2014-01-30 LAB — BLOOD GAS, ARTERIAL
Acid-Base Excess: 4.2 mmol/L — ABNORMAL HIGH (ref 0.0–2.0)
BICARBONATE: 28.4 meq/L — AB (ref 20.0–24.0)
Drawn by: 206361
FIO2: 0.21 %
O2 SAT: 97.4 %
PO2 ART: 96.9 mmHg (ref 80.0–100.0)
Patient temperature: 98.6
TCO2: 29.7 mmol/L (ref 0–100)
pCO2 arterial: 43.7 mmHg (ref 35.0–45.0)
pH, Arterial: 7.428 (ref 7.350–7.450)

## 2014-01-30 LAB — PROTIME-INR
INR: 1.27 (ref 0.00–1.49)
Prothrombin Time: 16 seconds — ABNORMAL HIGH (ref 11.6–15.2)

## 2014-01-30 LAB — COMPREHENSIVE METABOLIC PANEL
ALT: 12 U/L (ref 0–53)
AST: 17 U/L (ref 0–37)
Albumin: 3.5 g/dL (ref 3.5–5.2)
Alkaline Phosphatase: 50 U/L (ref 39–117)
Anion gap: 10 (ref 5–15)
BUN: 21 mg/dL (ref 6–23)
CALCIUM: 8.8 mg/dL (ref 8.4–10.5)
CO2: 26 meq/L (ref 19–32)
Chloride: 104 mEq/L (ref 96–112)
Creatinine, Ser: 0.76 mg/dL (ref 0.50–1.35)
GFR calc Af Amer: >90 mL/min (ref >90)
GFR, EST NON AFRICAN AMERICAN: 81 mL/min — AB (ref >90)
GLUCOSE: 95 mg/dL (ref 70–99)
Potassium: 3.9 mEq/L (ref 3.7–5.3)
Sodium: 140 mEq/L (ref 137–147)
Total Bilirubin: 0.6 mg/dL (ref 0.3–1.2)
Total Protein: 6.2 g/dL (ref 6.0–8.3)

## 2014-01-30 LAB — URINE MICROSCOPIC-ADD ON

## 2014-01-30 LAB — SURGICAL PCR SCREEN
MRSA, PCR: NEGATIVE
STAPHYLOCOCCUS AUREUS: NEGATIVE

## 2014-01-30 LAB — HEMOGLOBIN A1C
Hgb A1c MFr Bld: 5.5 % (ref <5.7)
MEAN PLASMA GLUCOSE: 111 mg/dL (ref <117)

## 2014-01-30 LAB — ABO/RH: ABO/RH(D): A POS

## 2014-01-30 LAB — APTT: aPTT: 34 seconds (ref 24–37)

## 2014-01-30 NOTE — Pre-Procedure Instructions (Signed)
Douglas Cowie Tinner Sr.  01/30/2014   Your procedure is scheduled on: Tuesday, November 17th              Report to [facility] Admitting at  9:00  AM.              (Tentative Surgical time is 11:00AM  to 2:00PM)   Call this number if you have problems the morning of surgery: [phone]   Remember:   Do not eat food or drink liquids after midnight Monday.   Take these medicines the morning of surgery with A SIP OF WATER: Metoprolol, Omeprazole   Do not wear jewelry = no rings or watches.  Do not wear lotions or colognes.  You may NOT wear deodorant the morning of surgery.   Men may shave face and neck.   Do not bring valuables to the hospital.  [facility] is not responsible for any belongings or valuables.               Contacts, dentures or bridgework may not be worn into surgery.  Leave suitcase in the car. After surgery it may be brought to your room.  For patients admitted to the hospital, discharge time is determined by your treatment team.    Name and phone number of your driver:    Special Instructions: "Preparing for Surgery" instruction sheet.   Please read over the following fact sheets that you were given: Pain Booklet, Coughing and Deep Breathing, Blood Transfusion Information, MRSA Information and Surgical Site Infection Prevention, Incentive Spirometry.

## 2014-02-02 ENCOUNTER — Ambulatory Visit: Payer: Medicare Other | Attending: Surgery | Admitting: Physical Therapy

## 2014-02-02 VITALS — BP 178/60 | HR 75

## 2014-02-02 DIAGNOSIS — I350 Nonrheumatic aortic (valve) stenosis: Secondary | ICD-10-CM | POA: Diagnosis not present

## 2014-02-02 DIAGNOSIS — Z5189 Encounter for other specified aftercare: Secondary | ICD-10-CM | POA: Diagnosis not present

## 2014-02-02 DIAGNOSIS — I5032 Chronic diastolic (congestive) heart failure: Secondary | ICD-10-CM | POA: Insufficient documentation

## 2014-02-02 DIAGNOSIS — R262 Difficulty in walking, not elsewhere classified: Secondary | ICD-10-CM | POA: Insufficient documentation

## 2014-02-02 MED ORDER — HEPARIN SODIUM (PORCINE) 1000 UNIT/ML IJ SOLN
INTRAMUSCULAR | Status: DC
  Filled 2014-02-02: qty 30

## 2014-02-02 MED ORDER — MAGNESIUM SULFATE 50 % IJ SOLN
40.0000 meq | INTRAMUSCULAR | Status: DC
  Filled 2014-02-02: qty 10

## 2014-02-02 MED ORDER — NOREPINEPHRINE BITARTRATE 1 MG/ML IV SOLN
0.0000 ug/min | INTRAVENOUS | Status: DC
  Filled 2014-02-02: qty 4

## 2014-02-02 MED ORDER — POTASSIUM CHLORIDE 2 MEQ/ML IV SOLN
80.0000 meq | INTRAVENOUS | Status: DC
  Filled 2014-02-02: qty 40

## 2014-02-02 MED ORDER — DEXTROSE 5 % IV SOLN
30.0000 ug/min | INTRAVENOUS | Status: DC
  Filled 2014-02-02: qty 2

## 2014-02-02 MED ORDER — NITROGLYCERIN IN D5W 200-5 MCG/ML-% IV SOLN
2.0000 ug/min | INTRAVENOUS | Status: DC
Start: 2014-02-03 — End: 2014-02-03
  Filled 2014-02-02: qty 250

## 2014-02-02 MED ORDER — VANCOMYCIN HCL 10 G IV SOLR
1250.0000 mg | INTRAVENOUS | Status: AC
  Administered 2014-02-03: 1250 mg via INTRAVENOUS
  Filled 2014-02-02: qty 1250

## 2014-02-02 MED ORDER — DEXTROSE 5 % IV SOLN
1.5000 g | INTRAVENOUS | Status: AC
  Administered 2014-02-03: 1.5 g via INTRAVENOUS
  Filled 2014-02-02 (×2): qty 1.5

## 2014-02-02 MED ORDER — EPINEPHRINE HCL 1 MG/ML IJ SOLN
0.0000 ug/min | INTRAVENOUS | Status: DC
  Filled 2014-02-02: qty 4

## 2014-02-02 MED ORDER — DOPAMINE-DEXTROSE 3.2-5 MG/ML-% IV SOLN
0.0000 ug/kg/min | INTRAVENOUS | Status: DC
  Filled 2014-02-02: qty 250

## 2014-02-02 MED ORDER — SODIUM CHLORIDE 0.9 % IV SOLN
INTRAVENOUS | Status: DC
  Filled 2014-02-02: qty 2.5

## 2014-02-02 MED ORDER — DEXMEDETOMIDINE HCL IN NACL 400 MCG/100ML IV SOLN
0.1000 ug/kg/h | INTRAVENOUS | Status: DC
  Filled 2014-02-02: qty 100

## 2014-02-02 NOTE — Progress Notes (Signed)
Anesthesia Chart Review:  Patient is a 80 year old male scheduled for TAVR, transfemoral approach on 02/03/14 by Dr. Burt Knack.  History includes remote former smoker, severe AS, CHF, HTN, HLD, skin cancer, BPH, scoliosis. PCP Dr. Elsie Stain.  Cardiologist Dr. Ida Rogue.   EKG on 11/28/13: NSR.  12/02/13 echo: - Left ventricle: The cavity size was normal. There was moderate concentric hypertrophy. Systolic function was normal. Theestimated ejection fraction was in the range of 60% to 65%. Wall motion was normal; there were no regional wall motion abnormalities. Features are consistent with a pseudonormal left ventricular filling pattern, with concomitant abnormal relaxation and increased filling pressure (grade 2 diastolic dysfunction). - Aortic valve: There was severe stenosis. There was mildregurgitation. Mean gradient (S): 47 mm Hg. Peak gradient (S): 32mm Hg. Valve area (VTI): 0.69 cm^2. - Mitral valve: There was moderate regurgitation. - Left atrium: The atrium was mildly to moderately dilated. - Right atrium: The atrium was mildly dilated. - Tricuspid valve: There was moderate regurgitation. - Pulmonary arteries: Systolic pressure was mildly increased. PA peak pressure: 43 mm Hg (S).  He underwent cardiac catheterization on 11/13/2013 [facility]) that showed right dominant coronary system, possibly codominant with mild luminal irregularities, mild to moderate proximal LAD disease (30% proximal LAD).   Preoperative CXR, PFTs, Cardiac CT, CTA abd/pelvis, and labs noted.    If no acute changes then anticipate that he can proceed.  George Hugh [facility] Short Stay Center/Anesthesiology Phone [phone] 02/02/2014 9:39 AM

## 2014-02-02 NOTE — Therapy (Signed)
Physical Therapy Evaluation  Patient Details  Name: Douglas Parker. MRN: [id number] Date of Birth: 17-Jan-1928  Encounter Date: 02/02/2014      PT End of Session - 02/02/14 0957    Visit Number 1   PT Start Time 0956   PT Stop Time 1040   PT Time Calculation (min) 44 min      Past Medical History  Diagnosis Date  . Hyperlipidemia 09/05/95  . Diverticulosis of colon 06/05/2002  . Benign prostatic hypertrophy 10/18/00  . Hypertension 05/19/03  . CHF (congestive heart failure) 04/21/03  . Scoliosis   . Colon polyps 06/05/2002    path could not be found   . Chronic diastolic congestive heart failure   . Aortic valve stenosis 09/10/2012  . Back pain   . Arthritis   . Cancer     skin cancers  . Thinning of skin     bruises easy.    Past Surgical History  Procedure Laterality Date  . Knee arthroscopy  06/1997    right  . Colonoscopy w/ biopsies  05/2002  . Flexible sigmoidoscopy  07/2005  . Cardiac catheterization  10/27/2013  . Tee without cardioversion    . Tonsillectomy    . Eye surgery      bilateral cataracts    BP 178/60 mmHg  Pulse 75  SpO2 96%  Visit Diagnosis:  Difficulty in walking - Plan: PT plan of care cert/re-cert      Subjective Assessment - 02/02/14 1005    Symptoms Pt presents to OP PT with primary dx of aortic stenosis and is planning to have TAVR procedure.   Patient Stated Goals To improve overall mobility.   Currently in Pain? No/denies          [facility] PT Assessment - 02/02/14 1014    Assessment   Medical Diagnosis aortic stenosis   Onset Date 01/02/14   Precautions   Precautions None   Restrictions   Weight Bearing Restrictions No   Balance Screen   Has the patient fallen in the past 6 months Yes   How many times? 1  Pt reports his "ladder threw him"   Has the patient had a decrease in activity level because of a fear of falling?  No   Is the patient reluctant to leave their home because of a fear of falling?  No   Home Environment    Living Enviornment Private residence   Living Arrangements Spouse/significant other   Type of [place] entrance   [place] One level   Prior Function   Level of [place] with gait   Posture/Postural Control   Posture/Postural Control Postural limitations   Postural Limitations Rounded Shoulders;Forward head;Flexed trunk;Posterior pelvic tilt   Posture Comments Pt reports scoliosis since a young age.   AROM   Overall AROM  Within functional limits for tasks performed   Strength   Overall Strength Within functional limits for tasks performed   Overall Strength Comments Grossly 4/5 throughout   Grip (lbs) 59 lbs R   Grip (lbs) 61 lbs L   Ambulation/Gait   Ambulation/Gait Yes   Gait Pattern Decreased dorsiflexion - right;Decreased dorsiflexion - left;Trunk flexed                  Plan - 02/02/14 1042    Clinical Impression Statement Pt was seen today prior to undergoing TAVR surgery which is scheduled for tomorrow. Pt was primarily limited by oxygen desaturation  with ambulation. Pt did not demonstrate any SOB with activity.   PT Frequency One time visit   Recommended Other Services Pt may benefit from skilled PT post surgery to return to prior level of function.    Consulted and Agree with Plan of Care Patient          G-Codes - 2014/02/24 1048    Functional Assessment Tool Used 6 minute walk test   Functional Limitation Mobility: Walking and moving around   Mobility: Walking and Moving Around Current Status [phone]) At least 20 percent but less than 40 percent impaired, limited or restricted   Mobility: Walking and Moving Around Goal Status [phone]) At least 20 percent but less than 40 percent impaired, limited or restricted   Mobility: Walking and Moving Around Discharge Status [phone]) At least 20 percent but less than 40 percent impaired, limited or restricted      Problem List Patient Active Problem List   Diagnosis Date  Noted  . Aortic stenosis, severe 12/19/2013  . Chronic diastolic congestive heart failure   . Back pain   . Edema 11/20/2013  . Cough 07/02/2013  . Loss of weight 06/26/2013  . Medicare annual wellness visit, initial 03/06/2013  . Shortness of breath 10/31/2012  . Aortic valve stenosis 09/10/2012  . Living will, counseling/discussion 12/28/2011  . CONSTIPATION, INTERMITTENT 01/02/2007  . Essential hypertension 05/19/2003  . BENIGN PROSTATIC HYPERTROPHY 10/18/2000  . CA IN SITU, SKIN NOS 05/19/1999  . Arthropathy of pelvic region and thigh 05/18/1997  . HYPERLIPIDEMIA 09/05/1995  . HEMORRHOIDS, INTERNAL, WITH BLEEDING 03/20/1994  . DIVERTICULOSIS, COLON 02/17/1994  . FX OPEN MLT PELVIS W/PELVIC CIRCULAT DISRUPT 03/20/1986  . SCOLIOSIS, THORACIC SPINE 03/20/1944                        [facility] Pre-Surgical Assessment - 02-24-14 1014    5 Meter Walk Test- trial 1 8 sec   5 Meter Walk Test- trial 2 6 sec.    5 Meter Walk Test- trial 3 7 sec.  Average 7 seconds indicates slow speed   Timed Up & Go Test trial  12 sec.   Comments not indicative of high fall risk   4 Stage Balance Test tolerated for:  1 sec.   4 Stage Balance Test Position 4   comment not indicative of high fall risk   Comment 16 seconds  (14.8 seconds or less is normal)   ADL/IADL Independent with: Bathing;Dressing;Meal prep;Finances   ADL/IADL Needs Assistance with: Yard work   ADL/IADL Fraility Index Vulnerable   Aerobic Endurance Distance Walked 150   Endurance additional comments 1:28 PT advised seated rest due to 82% O2, recovered within 30 seconds and resumed, down to 86% again within 30 seconds of walking, test stopped at 3:00   6 Minute Walk- Baseline yes   BP (mmHg) 178/60 mmHg   HR (bpm) 75   02 Sat (%RA) 96 %   Modified Borg Scale for Dyspnea 0- Nothing at all   Perceived Rate of Exertion (Borg) 6-   6 Minute Walk Post Test yes   BP (mmHg) 196/80 mmHg   HR (bpm) 80   02 Sat (%RA) 86  %   Modified Borg Scale for Dyspnea 0- Nothing at all   Perceived Rate of Exertion (Borg) 6-                              Hinton Dyer  Nicoletta, PT, DPT 02/02/2014 10:55 AM [phone]

## 2014-02-03 ENCOUNTER — Encounter (HOSPITAL_COMMUNITY): Payer: Self-pay | Admitting: Certified Registered"

## 2014-02-03 ENCOUNTER — Encounter (HOSPITAL_COMMUNITY)
Admission: RE | Disposition: A | Discharge status: Skilled Nursing Facility | Payer: Medicare Other | Source: Ambulatory Visit | Attending: Cardiovascular Disease

## 2014-02-03 ENCOUNTER — Inpatient Hospital Stay (HOSPITAL_COMMUNITY)
Admission: RE | Admit: 2014-02-03 | Discharge: 2014-02-06 | DRG: 267 | Disposition: A | Discharge status: Skilled Nursing Facility | Payer: Medicare Other | Source: Ambulatory Visit | Attending: Cardiovascular Disease | Admitting: Cardiovascular Disease

## 2014-02-03 ENCOUNTER — Inpatient Hospital Stay (HOSPITAL_COMMUNITY): Payer: Medicare Other | Admitting: Vascular Surgery

## 2014-02-03 ENCOUNTER — Inpatient Hospital Stay (HOSPITAL_COMMUNITY): Payer: Medicare Other | Admitting: Anesthesiology

## 2014-02-03 ENCOUNTER — Inpatient Hospital Stay (HOSPITAL_COMMUNITY): Payer: Medicare Other

## 2014-02-03 DIAGNOSIS — Z7902 Long term (current) use of antithrombotics/antiplatelets: Secondary | ICD-10-CM

## 2014-02-03 DIAGNOSIS — D62 Acute posthemorrhagic anemia: Secondary | ICD-10-CM | POA: Diagnosis not present

## 2014-02-03 DIAGNOSIS — J9811 Atelectasis: Secondary | ICD-10-CM | POA: Diagnosis not present

## 2014-02-03 DIAGNOSIS — N400 Enlarged prostate without lower urinary tract symptoms: Secondary | ICD-10-CM | POA: Diagnosis present

## 2014-02-03 DIAGNOSIS — K219 Gastro-esophageal reflux disease without esophagitis: Secondary | ICD-10-CM | POA: Diagnosis present

## 2014-02-03 DIAGNOSIS — D6959 Other secondary thrombocytopenia: Secondary | ICD-10-CM | POA: Diagnosis not present

## 2014-02-03 DIAGNOSIS — G8929 Other chronic pain: Secondary | ICD-10-CM | POA: Diagnosis not present

## 2014-02-03 DIAGNOSIS — Z79899 Other long term (current) drug therapy: Secondary | ICD-10-CM | POA: Diagnosis not present

## 2014-02-03 DIAGNOSIS — I10 Essential (primary) hypertension: Secondary | ICD-10-CM | POA: Diagnosis present

## 2014-02-03 DIAGNOSIS — M419 Scoliosis, unspecified: Secondary | ICD-10-CM | POA: Diagnosis present

## 2014-02-03 DIAGNOSIS — I5033 Acute on chronic diastolic (congestive) heart failure: Secondary | ICD-10-CM | POA: Diagnosis present

## 2014-02-03 DIAGNOSIS — Z87891 Personal history of nicotine dependence: Secondary | ICD-10-CM

## 2014-02-03 DIAGNOSIS — I2510 Atherosclerotic heart disease of native coronary artery without angina pectoris: Secondary | ICD-10-CM | POA: Diagnosis not present

## 2014-02-03 DIAGNOSIS — Z681 Body mass index (BMI) 19 or less, adult: Secondary | ICD-10-CM | POA: Diagnosis not present

## 2014-02-03 DIAGNOSIS — Z006 Encounter for examination for normal comparison and control in clinical research program: Secondary | ICD-10-CM

## 2014-02-03 DIAGNOSIS — I350 Nonrheumatic aortic (valve) stenosis: Secondary | ICD-10-CM | POA: Diagnosis present

## 2014-02-03 DIAGNOSIS — E785 Hyperlipidemia, unspecified: Secondary | ICD-10-CM | POA: Diagnosis present

## 2014-02-03 DIAGNOSIS — Z952 Presence of prosthetic heart valve: Secondary | ICD-10-CM

## 2014-02-03 DIAGNOSIS — I5032 Chronic diastolic (congestive) heart failure: Secondary | ICD-10-CM | POA: Diagnosis present

## 2014-02-03 DIAGNOSIS — I110 Hypertensive heart disease with heart failure: Secondary | ICD-10-CM | POA: Diagnosis present

## 2014-02-03 HISTORY — PX: TRANSCATHETER AORTIC VALVE REPLACEMENT, TRANSFEMORAL: SHX6400

## 2014-02-03 HISTORY — PX: INTRAOPERATIVE TRANSESOPHAGEAL ECHOCARDIOGRAM: SHX5062

## 2014-02-03 HISTORY — DX: Presence of prosthetic heart valve: Z95.2

## 2014-02-03 LAB — POCT I-STAT 3, ART BLOOD GAS (G3+)
ACID-BASE EXCESS: 5 mmol/L — AB (ref 0.0–2.0)
Bicarbonate: 30.7 mEq/L — ABNORMAL HIGH (ref 20.0–24.0)
O2 SAT: 99 %
PH ART: 7.386 (ref 7.350–7.450)
PO2 ART: 159 mmHg — AB (ref 80.0–100.0)
TCO2: 32 mmol/L (ref 0–100)
pCO2 arterial: 50.7 mmHg — ABNORMAL HIGH (ref 35.0–45.0)

## 2014-02-03 LAB — POCT I-STAT 4, (NA,K, GLUC, HGB,HCT)
Glucose, Bld: 106 mg/dL — ABNORMAL HIGH (ref 70–99)
HEMATOCRIT: 29 % — AB (ref 39.0–52.0)
HEMOGLOBIN: 9.9 g/dL — AB (ref 13.0–17.0)
Potassium: 3.4 mEq/L — ABNORMAL LOW (ref 3.7–5.3)
Sodium: 139 mEq/L (ref 137–147)

## 2014-02-03 LAB — CBC
HEMATOCRIT: 30 % — AB (ref 39.0–52.0)
Hemoglobin: 10.2 g/dL — ABNORMAL LOW (ref 13.0–17.0)
MCH: 30.9 pg (ref 26.0–34.0)
MCHC: 34 g/dL (ref 30.0–36.0)
MCV: 90.9 fL (ref 78.0–100.0)
Platelets: 102 10*3/uL — ABNORMAL LOW (ref 150–400)
RBC: 3.3 MIL/uL — ABNORMAL LOW (ref 4.22–5.81)
RDW: 13.4 % (ref 11.5–15.5)
WBC: 5 10*3/uL (ref 4.0–10.5)

## 2014-02-03 LAB — POCT I-STAT, CHEM 8
BUN: 15 mg/dL (ref 6–23)
BUN: 15 mg/dL (ref 6–23)
BUN: 14 mg/dL (ref 6–23)
CALCIUM ION: 1.25 mmol/L (ref 1.13–1.30)
CALCIUM ION: 1.22 mmol/L (ref 1.13–1.30)
CHLORIDE: 100 meq/L (ref 96–112)
CREATININE: 0.6 mg/dL (ref 0.50–1.35)
CREATININE: 0.6 mg/dL (ref 0.50–1.35)
Calcium, Ion: 1.22 mmol/L (ref 1.13–1.30)
Chloride: 100 mEq/L (ref 96–112)
Chloride: 101 mEq/L (ref 96–112)
Creatinine, Ser: 0.6 mg/dL (ref 0.50–1.35)
GLUCOSE: 96 mg/dL (ref 70–99)
Glucose, Bld: 98 mg/dL (ref 70–99)
Glucose, Bld: 101 mg/dL — ABNORMAL HIGH (ref 70–99)
HCT: 28 % — ABNORMAL LOW (ref 39.0–52.0)
HCT: 27 % — ABNORMAL LOW (ref 39.0–52.0)
HEMATOCRIT: 30 % — AB (ref 39.0–52.0)
HEMOGLOBIN: 9.2 g/dL — AB (ref 13.0–17.0)
Hemoglobin: 10.2 g/dL — ABNORMAL LOW (ref 13.0–17.0)
Hemoglobin: 9.5 g/dL — ABNORMAL LOW (ref 13.0–17.0)
POTASSIUM: 3.7 meq/L (ref 3.7–5.3)
Potassium: 3.5 mEq/L — ABNORMAL LOW (ref 3.7–5.3)
Potassium: 3.5 mEq/L — ABNORMAL LOW (ref 3.7–5.3)
Sodium: 140 mEq/L (ref 137–147)
Sodium: 140 mEq/L (ref 137–147)
Sodium: 139 mEq/L (ref 137–147)
TCO2: 29 mmol/L (ref 0–100)
TCO2: 26 mmol/L (ref 0–100)
TCO2: 26 mmol/L (ref 0–100)

## 2014-02-03 LAB — APTT: APTT: 38 s — AB (ref 24–37)

## 2014-02-03 LAB — GLUCOSE, CAPILLARY
GLUCOSE-CAPILLARY: 113 mg/dL — AB (ref 70–99)
GLUCOSE-CAPILLARY: 98 mg/dL (ref 70–99)
Glucose-Capillary: 112 mg/dL — ABNORMAL HIGH (ref 70–99)
Glucose-Capillary: 97 mg/dL (ref 70–99)

## 2014-02-03 LAB — PROTIME-INR
INR: 1.34 (ref 0.00–1.49)
Prothrombin Time: 16.8 seconds — ABNORMAL HIGH (ref 11.6–15.2)

## 2014-02-03 SURGERY — IMPLANTATION, AORTIC VALVE, TRANSCATHETER, FEMORAL APPROACH
Anesthesia: General | Site: Chest

## 2014-02-03 MED ORDER — POTASSIUM CHLORIDE 10 MEQ/50ML IV SOLN
10.0000 meq | INTRAVENOUS | Status: AC | PRN
  Administered 2014-02-03 (×3): 10 meq via INTRAVENOUS

## 2014-02-03 MED ORDER — LIDOCAINE HCL (CARDIAC) 20 MG/ML IV SOLN
INTRAVENOUS | Status: AC
  Filled 2014-02-03: qty 5

## 2014-02-03 MED ORDER — HEPARIN SODIUM (PORCINE) 1000 UNIT/ML IJ SOLN
INTRAMUSCULAR | Status: DC | PRN
  Administered 2014-02-03: 13000 [IU] via INTRAVENOUS

## 2014-02-03 MED ORDER — SODIUM CHLORIDE 0.9 % IR SOLN
Status: DC | PRN
  Administered 2014-02-03 (×3): 500 mL

## 2014-02-03 MED ORDER — STERILE WATER FOR INJECTION IJ SOLN
INTRAMUSCULAR | Status: AC
  Filled 2014-02-03: qty 10

## 2014-02-03 MED ORDER — CHLORHEXIDINE GLUCONATE 4 % EX LIQD: 30.0000 mL | CUTANEOUS | Status: DC

## 2014-02-03 MED ORDER — CHLORHEXIDINE GLUCONATE 4 % EX LIQD
60.0000 mL | Freq: Once | CUTANEOUS | Status: DC
  Filled 2014-02-03: qty 60

## 2014-02-03 MED ORDER — LABETALOL HCL 5 MG/ML IV SOLN
INTRAVENOUS | Status: DC | PRN
  Administered 2014-02-03 (×4): 10 mg via INTRAVENOUS

## 2014-02-03 MED ORDER — LACTATED RINGERS IV SOLN: INTRAVENOUS | Status: DC

## 2014-02-03 MED ORDER — PHENYLEPHRINE HCL 10 MG/ML IJ SOLN
INTRAMUSCULAR | Status: DC | PRN
  Administered 2014-02-03 (×2): 40 ug via INTRAVENOUS

## 2014-02-03 MED ORDER — ROCURONIUM BROMIDE 50 MG/5ML IV SOLN
INTRAVENOUS | Status: AC
  Filled 2014-02-03: qty 1

## 2014-02-03 MED ORDER — ARTIFICIAL TEARS OP OINT
TOPICAL_OINTMENT | OPHTHALMIC | Status: AC
  Filled 2014-02-03: qty 3.5

## 2014-02-03 MED ORDER — NEOSTIGMINE METHYLSULFATE 10 MG/10ML IV SOLN
INTRAVENOUS | Status: DC | PRN
  Administered 2014-02-03: 3 mg via INTRAVENOUS

## 2014-02-03 MED ORDER — SODIUM CHLORIDE 0.9 % IV SOLN: INTRAVENOUS | Status: DC | PRN

## 2014-02-03 MED ORDER — MIDAZOLAM HCL 2 MG/2ML IJ SOLN
1.0000 mg | INTRAMUSCULAR | Status: DC | PRN
  Filled 2014-02-03: qty 2

## 2014-02-03 MED ORDER — METOPROLOL TARTRATE 12.5 MG HALF TABLET: 12.5000 mg | ORAL_TABLET | Freq: Once | ORAL | Status: DC

## 2014-02-03 MED ORDER — MORPHINE SULFATE 2 MG/ML IJ SOLN
2.0000 mg | INTRAMUSCULAR | Status: DC | PRN
  Filled 2014-02-03: qty 1

## 2014-02-03 MED ORDER — SODIUM CHLORIDE 0.9 % IV SOLN
INTRAVENOUS | Status: DC
  Filled 2014-02-03: qty 2.5

## 2014-02-03 MED ORDER — FAMOTIDINE IN NACL 20-0.9 MG/50ML-% IV SOLN
20.0000 mg | Freq: Two times a day (BID) | INTRAVENOUS | Status: AC
  Administered 2014-02-03: 20 mg via INTRAVENOUS
  Filled 2014-02-03: qty 50

## 2014-02-03 MED ORDER — ATORVASTATIN CALCIUM 20 MG PO TABS
20.0000 mg | ORAL_TABLET | Freq: Every day | ORAL | Status: DC
  Administered 2014-02-03 – 2014-02-05 (×3): 20 mg via ORAL
  Filled 2014-02-03 (×4): qty 1

## 2014-02-03 MED ORDER — MIDAZOLAM HCL 2 MG/2ML IJ SOLN: 2.0000 mg | INTRAMUSCULAR | Status: DC | PRN

## 2014-02-03 MED ORDER — MORPHINE SULFATE 2 MG/ML IJ SOLN
1.0000 mg | INTRAMUSCULAR | Status: AC | PRN
  Administered 2014-02-03: 2 mg via INTRAVENOUS

## 2014-02-03 MED ORDER — LATANOPROST 0.005 % OP SOLN
1.0000 [drp] | Freq: Every day | OPHTHALMIC | Status: DC
  Administered 2014-02-03 – 2014-02-05 (×3): 1 [drp] via OPHTHALMIC
  Filled 2014-02-03: qty 2.5

## 2014-02-03 MED ORDER — SODIUM CHLORIDE 0.9 % IJ SOLN
INTRAMUSCULAR | Status: AC
  Filled 2014-02-03: qty 10

## 2014-02-03 MED ORDER — OXYCODONE HCL 5 MG PO TABS: 5.0000 mg | ORAL_TABLET | ORAL | Status: DC | PRN

## 2014-02-03 MED ORDER — METOPROLOL TARTRATE 12.5 MG HALF TABLET
12.5000 mg | ORAL_TABLET | Freq: Two times a day (BID) | ORAL | Status: DC
  Administered 2014-02-03: 12.5 mg via ORAL
  Filled 2014-02-03 (×3): qty 1

## 2014-02-03 MED ORDER — SODIUM CHLORIDE 0.9 % IJ SOLN: 3.0000 mL | INTRAMUSCULAR | Status: DC | PRN

## 2014-02-03 MED ORDER — LABETALOL HCL 5 MG/ML IV SOLN
10.0000 mg | INTRAVENOUS | Status: DC | PRN
  Administered 2014-02-03 – 2014-02-04 (×3): 10 mg via INTRAVENOUS
  Filled 2014-02-03 (×3): qty 4

## 2014-02-03 MED ORDER — DEXTROSE 5 % IV SOLN
1.5000 g | Freq: Two times a day (BID) | INTRAVENOUS | Status: AC
  Administered 2014-02-03 – 2014-02-05 (×4): 1.5 g via INTRAVENOUS
  Filled 2014-02-03 (×5): qty 1.5

## 2014-02-03 MED ORDER — SODIUM CHLORIDE 0.9 % IV SOLN
1.0000 mL/kg/h | INTRAVENOUS | Status: AC
Start: 2014-02-03 — End: 2014-02-03
  Administered 2014-02-03: 1 mL/kg/h via INTRAVENOUS

## 2014-02-03 MED ORDER — ONDANSETRON HCL 4 MG/2ML IJ SOLN
INTRAMUSCULAR | Status: DC | PRN
  Administered 2014-02-03: 4 mg via INTRAVENOUS

## 2014-02-03 MED ORDER — ACETAMINOPHEN 160 MG/5ML PO SOLN: 650.0000 mg | Freq: Once | ORAL | Status: AC

## 2014-02-03 MED ORDER — VECURONIUM BROMIDE 10 MG IV SOLR
INTRAVENOUS | Status: DC | PRN
  Administered 2014-02-03: 2 mg via INTRAVENOUS
  Administered 2014-02-03: 6 mg via INTRAVENOUS

## 2014-02-03 MED ORDER — NITROGLYCERIN IN D5W 200-5 MCG/ML-% IV SOLN
0.0000 ug/min | INTRAVENOUS | Status: DC
  Administered 2014-02-03: 5 ug/min via INTRAVENOUS

## 2014-02-03 MED ORDER — GLYCOPYRROLATE 0.2 MG/ML IJ SOLN
INTRAMUSCULAR | Status: DC | PRN
  Administered 2014-02-03: 0.4 mg via INTRAVENOUS

## 2014-02-03 MED ORDER — ASPIRIN EC 81 MG PO TBEC
81.0000 mg | DELAYED_RELEASE_TABLET | Freq: Every day | ORAL | Status: DC
  Filled 2014-02-03 (×2): qty 1

## 2014-02-03 MED ORDER — PNEUMOCOCCAL VAC POLYVALENT 25 MCG/0.5ML IJ INJ
0.5000 mL | INJECTION | INTRAMUSCULAR | Status: DC
  Filled 2014-02-03: qty 0.5

## 2014-02-03 MED ORDER — PROPOFOL 10 MG/ML IV BOLUS
INTRAVENOUS | Status: AC
  Filled 2014-02-03: qty 20

## 2014-02-03 MED ORDER — ASPIRIN 81 MG PO TBEC: 81.0000 mg | DELAYED_RELEASE_TABLET | Freq: Every day | ORAL | Status: DC

## 2014-02-03 MED ORDER — FENTANYL CITRATE 0.05 MG/ML IJ SOLN
INTRAMUSCULAR | Status: DC | PRN
  Administered 2014-02-03: 100 ug via INTRAVENOUS
  Administered 2014-02-03: 50 ug via INTRAVENOUS

## 2014-02-03 MED ORDER — PANTOPRAZOLE SODIUM 40 MG PO TBEC
40.0000 mg | DELAYED_RELEASE_TABLET | Freq: Every day | ORAL | Status: DC
  Administered 2014-02-05 – 2014-02-06 (×2): 40 mg via ORAL
  Filled 2014-02-03: qty 1

## 2014-02-03 MED ORDER — LACTATED RINGERS IV SOLN
INTRAVENOUS | Status: DC | PRN
  Administered 2014-02-03 (×2): via INTRAVENOUS

## 2014-02-03 MED ORDER — DEXMEDETOMIDINE HCL IN NACL 200 MCG/50ML IV SOLN: 0.1000 ug/kg/h | INTRAVENOUS | Status: DC

## 2014-02-03 MED ORDER — VANCOMYCIN HCL IN DEXTROSE 1-5 GM/200ML-% IV SOLN
1000.0000 mg | Freq: Once | INTRAVENOUS | Status: AC
  Administered 2014-02-04: 1000 mg via INTRAVENOUS
  Filled 2014-02-03: qty 200

## 2014-02-03 MED ORDER — TRAMADOL HCL 50 MG PO TABS
50.0000 mg | ORAL_TABLET | ORAL | Status: DC | PRN
  Administered 2014-02-03: 100 mg via ORAL
  Administered 2014-02-04: 50 mg via ORAL
  Filled 2014-02-03: qty 2
  Filled 2014-02-03: qty 1

## 2014-02-03 MED ORDER — SODIUM CHLORIDE 0.9 % IV SOLN: INTRAVENOUS | Status: DC

## 2014-02-03 MED ORDER — VECURONIUM BROMIDE 10 MG IV SOLR
INTRAVENOUS | Status: AC
  Filled 2014-02-03: qty 10

## 2014-02-03 MED ORDER — INSULIN ASPART 100 UNIT/ML ~~LOC~~ SOLN
0.0000 [IU] | SUBCUTANEOUS | Status: DC
Start: 2014-02-03 — End: 2014-02-06
  Administered 2014-02-04 – 2014-02-05 (×3): 2 [IU] via SUBCUTANEOUS

## 2014-02-03 MED ORDER — AMLODIPINE BESYLATE 5 MG PO TABS
5.0000 mg | ORAL_TABLET | Freq: Every day | ORAL | Status: DC
  Administered 2014-02-03 – 2014-02-04 (×2): 5 mg via ORAL
  Filled 2014-02-03 (×3): qty 1

## 2014-02-03 MED ORDER — ONDANSETRON HCL 4 MG/2ML IJ SOLN
INTRAMUSCULAR | Status: AC
  Filled 2014-02-03: qty 2

## 2014-02-03 MED ORDER — METOPROLOL TARTRATE 1 MG/ML IV SOLN
2.5000 mg | INTRAVENOUS | Status: DC | PRN
Start: 2014-02-03 — End: 2014-02-06

## 2014-02-03 MED ORDER — SODIUM CHLORIDE 0.9 % IJ SOLN
3.0000 mL | Freq: Two times a day (BID) | INTRAMUSCULAR | Status: DC
  Administered 2014-02-03 – 2014-02-06 (×5): 3 mL via INTRAVENOUS

## 2014-02-03 MED ORDER — FINASTERIDE 5 MG PO TABS
5.0000 mg | ORAL_TABLET | Freq: Every day | ORAL | Status: DC
  Administered 2014-02-04 – 2014-02-06 (×3): 5 mg via ORAL
  Filled 2014-02-03 (×4): qty 1

## 2014-02-03 MED ORDER — LIDOCAINE HCL (CARDIAC) 20 MG/ML IV SOLN
INTRAVENOUS | Status: DC | PRN
  Administered 2014-02-03: 60 mg via INTRAVENOUS

## 2014-02-03 MED ORDER — CLOPIDOGREL BISULFATE 75 MG PO TABS
75.0000 mg | ORAL_TABLET | Freq: Every day | ORAL | Status: DC
  Administered 2014-02-04 – 2014-02-06 (×3): 75 mg via ORAL
  Filled 2014-02-03 (×4): qty 1

## 2014-02-03 MED ORDER — SODIUM CHLORIDE 0.9 % IV SOLN: 250.0000 mL | INTRAVENOUS | Status: DC | PRN

## 2014-02-03 MED ORDER — ACETAMINOPHEN 650 MG RE SUPP
650.0000 mg | Freq: Once | RECTAL | Status: AC
  Administered 2014-02-03: 650 mg via RECTAL

## 2014-02-03 MED ORDER — 0.9 % SODIUM CHLORIDE (POUR BTL) OPTIME
TOPICAL | Status: DC | PRN
  Administered 2014-02-03: 6000 mL

## 2014-02-03 MED ORDER — ACETAMINOPHEN 500 MG PO TABS
1000.0000 mg | ORAL_TABLET | Freq: Four times a day (QID) | ORAL | Status: DC
  Administered 2014-02-04 – 2014-02-06 (×9): 1000 mg via ORAL
  Filled 2014-02-03 (×14): qty 2

## 2014-02-03 MED ORDER — ACETAMINOPHEN 160 MG/5ML PO SOLN: 1000.0000 mg | Freq: Four times a day (QID) | ORAL | Status: DC

## 2014-02-03 MED ORDER — MIDAZOLAM HCL 2 MG/2ML IJ SOLN
INTRAMUSCULAR | Status: AC
  Filled 2014-02-03: qty 2

## 2014-02-03 MED ORDER — MORPHINE SULFATE 2 MG/ML IJ SOLN: 2.0000 mg | INTRAMUSCULAR | Status: DC | PRN

## 2014-02-03 MED ORDER — ONDANSETRON HCL 4 MG/2ML IJ SOLN
4.0000 mg | Freq: Four times a day (QID) | INTRAMUSCULAR | Status: DC | PRN
  Administered 2014-02-03 – 2014-02-04 (×2): 4 mg via INTRAVENOUS
  Filled 2014-02-03 (×2): qty 2

## 2014-02-03 MED ORDER — FENTANYL CITRATE 0.05 MG/ML IJ SOLN: 25.0000 ug | INTRAMUSCULAR | Status: DC | PRN

## 2014-02-03 MED ORDER — FENTANYL CITRATE 0.05 MG/ML IJ SOLN
50.0000 ug | INTRAMUSCULAR | Status: DC | PRN
  Administered 2014-02-03: 50 ug via INTRAVENOUS
  Filled 2014-02-03: qty 2

## 2014-02-03 MED ORDER — IODIXANOL 320 MG/ML IV SOLN
INTRAVENOUS | Status: DC | PRN
  Administered 2014-02-03: 65.9 mL via INTRAVENOUS

## 2014-02-03 MED ORDER — PROTAMINE SULFATE 10 MG/ML IV SOLN
INTRAVENOUS | Status: DC | PRN
  Administered 2014-02-03: 20 mg via INTRAVENOUS
  Administered 2014-02-03: 30 mg via INTRAVENOUS
  Administered 2014-02-03: 20 mg via INTRAVENOUS
  Administered 2014-02-03: 30 mg via INTRAVENOUS

## 2014-02-03 MED ORDER — FENTANYL CITRATE 0.05 MG/ML IJ SOLN
INTRAMUSCULAR | Status: AC
  Filled 2014-02-03: qty 5

## 2014-02-03 MED ORDER — PHENYLEPHRINE HCL 10 MG/ML IJ SOLN
0.0000 ug/min | INTRAMUSCULAR | Status: DC
  Filled 2014-02-03: qty 2

## 2014-02-03 MED ORDER — INSULIN REGULAR BOLUS VIA INFUSION
0.0000 [IU] | Freq: Three times a day (TID) | INTRAVENOUS | Status: DC
  Filled 2014-02-03: qty 10

## 2014-02-03 MED ORDER — LACTATED RINGERS IV SOLN: 500.0000 mL | Freq: Once | INTRAVENOUS | Status: AC | PRN

## 2014-02-03 MED ORDER — PROPOFOL 10 MG/ML IV BOLUS
INTRAVENOUS | Status: DC | PRN
  Administered 2014-02-03: 60 mg via INTRAVENOUS
  Administered 2014-02-03: 20 mg via INTRAVENOUS

## 2014-02-03 MED ORDER — METOPROLOL TARTRATE 25 MG/10 ML ORAL SUSPENSION
12.5000 mg | Freq: Two times a day (BID) | ORAL | Status: DC
  Filled 2014-02-03 (×3): qty 5

## 2014-02-03 MED ORDER — ALBUMIN HUMAN 5 % IV SOLN
250.0000 mL | INTRAVENOUS | Status: AC | PRN
  Administered 2014-02-03: 250 mL via INTRAVENOUS

## 2014-02-03 SURGICAL SUPPLY — 122 items
ADAPTER CARDIOPLEGIA (MISCELLANEOUS) IMPLANT
ADH SKN CLS APL DERMABOND .7 (GAUZE/BANDAGES/DRESSINGS) ×2
ANTEGRADE CPLG (MISCELLANEOUS) IMPLANT
ATTRACTOMAT 16X20 MAGNETIC DRP (DRAPES) IMPLANT
BAG BANDED W/RUBBER/TAPE 36X54 (MISCELLANEOUS) ×3 IMPLANT
BAG DECANTER FOR FLEXI CONT (MISCELLANEOUS) IMPLANT
BAG EQP BAND 135X91 W/RBR TAPE (MISCELLANEOUS) ×2
BAG SNAP BAND KOVER 36X36 (MISCELLANEOUS) ×3 IMPLANT
BLADE 10 SAFETY STRL DISP (BLADE) ×3 IMPLANT
BLADE STERNUM SYSTEM 6 (BLADE) ×3 IMPLANT
BLADE SURG ROTATE 9660 (MISCELLANEOUS) IMPLANT
CABLE PACING FASLOC BIEGE (MISCELLANEOUS) ×3 IMPLANT
CABLE PACING FASLOC BLUE (MISCELLANEOUS) ×3 IMPLANT
CANISTER SUCTION 2500CC (MISCELLANEOUS) IMPLANT
CANNULA FEM VENOUS REMOTE 22FR (CANNULA) IMPLANT
CANNULA FEMORAL ART 14 SM (MISCELLANEOUS) IMPLANT
CANNULA GUNDRY RCSP 15FR (MISCELLANEOUS) IMPLANT
CANNULA OPTISITE PERFUSION 16F (CANNULA) IMPLANT
CANNULA OPTISITE PERFUSION 18F (CANNULA) IMPLANT
CANNULA SOFTFLOW AORTIC 7M21FR (CANNULA) IMPLANT
CANNULA VENOUS LOW PROF 34X46 (CANNULA) IMPLANT
CATH DIAG EXPO 6F AL2 (CATHETERS) ×1 IMPLANT
CATH DIAG EXPO 6F VENT PIG 145 (CATHETERS) ×2 IMPLANT
CATH EXPO 5FR AL1 (CATHETERS) ×2 IMPLANT
CATH HEART VENT LEFT (CATHETERS) IMPLANT
CATH S G BIP PACING (SET/KITS/TRAYS/PACK) ×4 IMPLANT
CATH SOFT-VU 4F 65 STRAIGHT (CATHETERS) IMPLANT
CATH SOFT-VU STRAIGHT 4F 65CM (CATHETERS) ×3
CLIP TI MEDIUM 24 (CLIP) ×3 IMPLANT
CLIP TI WIDE RED SMALL 24 (CLIP) ×3 IMPLANT
CONN ST 1/4X3/8  BEN (MISCELLANEOUS)
CONN ST 1/4X3/8 BEN (MISCELLANEOUS) IMPLANT
CONNECTOR 1/2X3/8X1/2 3 WAY (MISCELLANEOUS)
CONNECTOR 1/2X3/8X1/2 3WAY (MISCELLANEOUS) IMPLANT
COVER DOME SNAP 22 D (MISCELLANEOUS) ×3 IMPLANT
COVER MAYO STAND STRL (DRAPES) ×3 IMPLANT
COVER PROBE W GEL 5X96 (DRAPES) IMPLANT
COVER SURGICAL LIGHT HANDLE (MISCELLANEOUS) ×3 IMPLANT
COVER TABLE BACK 60X90 (DRAPES) ×3 IMPLANT
CRADLE DONUT ADULT HEAD (MISCELLANEOUS) ×3 IMPLANT
DERMABOND ADVANCED (GAUZE/BANDAGES/DRESSINGS) ×1
DERMABOND ADVANCED .7 DNX12 (GAUZE/BANDAGES/DRESSINGS) ×2 IMPLANT
DRAIN CHANNEL 28F RND 3/8 FF (WOUND CARE) IMPLANT
DRAIN CHANNEL 32F RND 10.7 FF (WOUND CARE) IMPLANT
DRAPE INCISE IOBAN 66X45 STRL (DRAPES) IMPLANT
DRAPE SLUSH/WARMER DISC (DRAPES) ×3 IMPLANT
DRAPE TABLE COVER HEAVY DUTY (DRAPES) ×3 IMPLANT
DRSG TEGADERM 4X4.75 (GAUZE/BANDAGES/DRESSINGS) ×4 IMPLANT
ELECT REM PT RETURN 9FT ADLT (ELECTROSURGICAL) ×6
ELECTRODE REM PT RTRN 9FT ADLT (ELECTROSURGICAL) ×4 IMPLANT
FELT TEFLON 6X6 (MISCELLANEOUS) ×3 IMPLANT
FEMORAL VENOUS CANN RAP (CANNULA) IMPLANT
GAUZE SPONGE 4X4 12PLY STRL (GAUZE/BANDAGES/DRESSINGS) ×3 IMPLANT
GLOVE ECLIPSE 7.5 STRL STRAW (GLOVE) ×3 IMPLANT
GLOVE ECLIPSE 8.0 STRL XLNG CF (GLOVE) ×6 IMPLANT
GLOVE EUDERMIC 7 POWDERFREE (GLOVE) ×3 IMPLANT
GLOVE ORTHO TXT STRL SZ7.5 (GLOVE) ×3 IMPLANT
GOWN STRL REUS W/ TWL LRG LVL3 (GOWN DISPOSABLE) ×6 IMPLANT
GOWN STRL REUS W/ TWL XL LVL3 (GOWN DISPOSABLE) ×12 IMPLANT
GOWN STRL REUS W/TWL LRG LVL3 (GOWN DISPOSABLE) ×9
GOWN STRL REUS W/TWL XL LVL3 (GOWN DISPOSABLE) ×18
GUIDEWIRE SAF TJ AMPL .035X180 (WIRE) ×3 IMPLANT
GUIDEWIRE SAFE TJ AMPLATZ EXST (WIRE) ×1 IMPLANT
GUIDEWIRE STRAIGHT .035 260CM (WIRE) ×1 IMPLANT
HEMOSTAT POWDER SURGIFOAM 1G (HEMOSTASIS) IMPLANT
INSERT FOGARTY 61MM (MISCELLANEOUS) IMPLANT
INSERT FOGARTY SM (MISCELLANEOUS) ×2 IMPLANT
INSERT FOGARTY XLG (MISCELLANEOUS) IMPLANT
KIT BASIN OR (CUSTOM PROCEDURE TRAY) ×3 IMPLANT
KIT DILATOR VASC 18G NDL (KITS) IMPLANT
KIT HEART LEFT (KITS) ×1 IMPLANT
KIT ROOM TURNOVER OR (KITS) ×3 IMPLANT
KIT SUCTION CATH 14FR (SUCTIONS) ×6 IMPLANT
LEAD PACING MYOCARDI (MISCELLANEOUS) IMPLANT
NDL PERC 18GX7CM (NEEDLE) ×2 IMPLANT
NEEDLE PERC 18GX7CM (NEEDLE) ×3 IMPLANT
NS IRRIG 1000ML POUR BTL (IV SOLUTION) ×9 IMPLANT
PACK AORTA (CUSTOM PROCEDURE TRAY) ×3 IMPLANT
PAD ARMBOARD 7.5X6 YLW CONV (MISCELLANEOUS) ×6 IMPLANT
PAD ELECT DEFIB RADIOL ZOLL (MISCELLANEOUS) ×3 IMPLANT
PATCH TACHOSII LRG 9.5X4.8 (VASCULAR PRODUCTS) IMPLANT
SET CANNULATION TOURNIQUET (MISCELLANEOUS) IMPLANT
SHEATH PINNACLE 6F 10CM (SHEATH) ×2 IMPLANT
SPONGE GAUZE 4X4 12PLY STER LF (GAUZE/BANDAGES/DRESSINGS) ×1 IMPLANT
SPONGE LAP 4X18 X RAY DECT (DISPOSABLE) ×3 IMPLANT
STOPCOCK MORSE 400PSI 3WAY (MISCELLANEOUS) ×3 IMPLANT
SUT ETHIBOND 2 0 SH (SUTURE) ×3
SUT ETHIBOND 2 0 SH 36X2 (SUTURE) ×2 IMPLANT
SUT ETHIBOND X763 2 0 SH 1 (SUTURE) ×6 IMPLANT
SUT MNCRL AB 3-0 PS2 18 (SUTURE) ×3 IMPLANT
SUT PDS AB 1 CTX 36 (SUTURE) IMPLANT
SUT PROLENE 2 0 MH 48 (SUTURE) IMPLANT
SUT PROLENE 3 0 SH1 36 (SUTURE) IMPLANT
SUT PROLENE 4 0 RB 1 (SUTURE)
SUT PROLENE 4-0 RB1 .5 CRCL 36 (SUTURE) IMPLANT
SUT PROLENE 5 0 C 1 36 (SUTURE) ×9 IMPLANT
SUT PROLENE 6 0 C 1 30 (SUTURE) ×9 IMPLANT
SUT SILK  1 MH (SUTURE) ×1
SUT SILK 1 MH (SUTURE) ×2 IMPLANT
SUT SILK 2 0 SH CR/8 (SUTURE) ×3 IMPLANT
SUT TEM PAC WIRE 2 0 SH (SUTURE) IMPLANT
SUT VIC AB 2-0 CTX 36 (SUTURE) IMPLANT
SUT VIC AB 3-0 SH 8-18 (SUTURE) ×6 IMPLANT
SYR 30ML LL (SYRINGE) ×6 IMPLANT
SYR 50ML LL SCALE MARK (SYRINGE) ×3 IMPLANT
SYSTEM SAHARA CHEST DRAIN ATS (WOUND CARE) ×3 IMPLANT
TAPE CLOTH SURG 4X10 WHT LF (GAUZE/BANDAGES/DRESSINGS) ×1 IMPLANT
TORCON NB ADVANTAGE CATHETER ×1 IMPLANT
TOWEL OR 17X24 6PK STRL BLUE (TOWEL DISPOSABLE) ×9 IMPLANT
TOWEL OR 17X26 10 PK STRL BLUE (TOWEL DISPOSABLE) ×6 IMPLANT
TRANSDUCER W/STOPCOCK (MISCELLANEOUS) ×2 IMPLANT
TRAY FOLEY IC TEMP SENS 14FR (CATHETERS) ×3 IMPLANT
TUBE SUCT INTRACARD DLP 20F (MISCELLANEOUS) IMPLANT
TUBING ART PRESS 72  MALE/FEM (TUBING) ×1
TUBING ART PRESS 72 MALE/FEM (TUBING) IMPLANT
TUBING HIGH PRESSURE 120CM (CONNECTOR) ×3 IMPLANT
UNDERPAD 30X30 INCONTINENT (UNDERPADS AND DIAPERS) ×3 IMPLANT
VALVE HEART TRANSCATH SZ3 26MM (Prosthesis & Implant Heart) ×1 IMPLANT
VENT LEFT HEART 12002 (CATHETERS)
WATER STERILE IRR 1000ML POUR (IV SOLUTION) ×6 IMPLANT
WIRE .035 3MM-J 145CM (WIRE) ×1 IMPLANT
WIRE AMPLATZ SS-J .035X180CM (WIRE) ×1 IMPLANT

## 2014-02-03 NOTE — Plan of Care (Signed)
Problem: Phase II - Intermediate Post-Op Goal: Wean to Extubate Outcome: Completed/Met Date Met:  02/03/14 Goal: Pain controlled with appropriate interventions Outcome: Completed/Met Date Met:  02/03/14

## 2014-02-03 NOTE — Transfer of Care (Signed)
Immediate Anesthesia Transfer of Care Note  Patient: [place]  Procedure(s) Performed: Procedure(s): TRANSCATHETER AORTIC VALVE REPLACEMENT, TRANSFEMORAL (N/A) INTRAOPERATIVE TRANSESOPHAGEAL ECHOCARDIOGRAM (N/A)  Patient Location: SICU  Anesthesia Type:General  Level of Consciousness: awake  Airway & Oxygen Therapy: Patient Spontanous Breathing and Patient connected to nasal cannula oxygen  Post-op Assessment: Report given to PACU RN and Post -op Vital signs reviewed and stable  Post vital signs: Reviewed and stable  Complications: No apparent anesthesia complications

## 2014-02-03 NOTE — Anesthesia Postprocedure Evaluation (Signed)
  Anesthesia Post-op Note  Patient: Douglas Parker  Procedure(s) Performed: Procedure(s): TRANSCATHETER AORTIC VALVE REPLACEMENT, TRANSFEMORAL (N/A) INTRAOPERATIVE TRANSESOPHAGEAL ECHOCARDIOGRAM (N/A)  Patient Location: SICU  Anesthesia Type:General  Level of Consciousness: awake  Airway and Oxygen Therapy: Patient Spontanous Breathing and Patient connected to nasal cannula oxygen  Post-op Pain: none  Post-op Assessment: Post-op Vital signs reviewed, Patient's Cardiovascular Status Stable and Respiratory Function Stable  Post-op Vital Signs: Reviewed and stable  Last Vitals:  Filed Vitals:   02/03/14 1308  BP:   Pulse: 60  Temp:   Resp:     Complications: No apparent anesthesia complications

## 2014-02-03 NOTE — Progress Notes (Signed)
  Echocardiogram Echocardiogram Transesophageal has been performed.  Douglas Parker Douglas Parker 02/03/2014, 2:16 PM

## 2014-02-03 NOTE — Op Note (Signed)
CARDIOTHORACIC SURGERY OPERATIVE NOTE  Date of Procedure:  02/03/2014  Preoperative Diagnosis: Severe Aortic Stenosis   Postoperative Diagnosis: Same   Procedure:    Transcatheter Aortic Valve Replacement - Open Left Transfemoral Approach  Edwards Sapien 3 Transcatheter Heart Valve (size 26 mm, model # 9600TFX, serial # [id number])   Co-Surgeons:  Valentina Gu. Roxy Manns, MD and Sherren Mocha, MD  Assistants:   Gaye Pollack, MD and Lauree Chandler, MD  Anesthesiologist:  Laurie Panda, MD  Echocardiographer:  Ena Dawley, MD  Pre-operative Echo Findings:  Severe aortic stenosis  Normal left ventricular systolic function  Post-operative Echo Findings:  Trivial paravalvular leak  Normal left ventricular systolic function      DETAILS OF THE OPERATIVE PROCEDURE  The majority of the procedure is documented separately in a procedure note by Dr. Burt Knack.   TRANSFEMORAL ACCESS:   A small incision is made in the left groin immediately over the common femoral artery. The subcutaneous tissues are divided with electrocautery and the anterior surface of the common femoral artery is identified. Sharp dissection is utilized to free up the artery proximally and distally and the vessel is encircled with a vessel loop.  A pair of CV-4 Gore-tex sutures are place as diamond-shaped purse-strings on the anterior surface of the femoral artery.  The patient is heparinized systemically and ACT verified > 250 seconds.  The common femoral artery is punctured using an 18 gauge needle and a soft J-tipped guidewire is passed into the common iliac artery under fluoroscopic guidance.  A 6 Fr straight diagnostic catheter is placed over the guidewire and the guidewire is removed.  An Amplatz super stiff guidewire is passed through the sheath into the descending thoracic aorta and the introducing diagnostic catheter is removed.  Serial dilators are passed over the guidewire under continuous fluoroscopic  guidance, making certain that each dilator passes easily all of the way into the distal abdominal aorta.  A 14 Fr Commander introducer sheath is passed over the guidewire into the abdominal aorta.  The introducing dilator is removed, the sheath is flushed with heparinized saline, and the sheath is secured to the skin.    FEMORAL SHEATH REMOVAL AND ARTERIAL CLOSURE:  After the completion of successful valve deployment as documented separately by Dr. Burt Knack, the femoral artery sheath is removed and the arteriotomy is closed using the previously placed Gore-tex purse-string sutures. Once the repair has been completed protamine was administered to reverse the anticoagulation. A digitally-subtracted arteriogram is obtained from just above the aortic bifurcation to below the arteriotomy to confirm the integrity of the vascular repair.  The incision is irrigated with saline solution and subsequently closed in multiple layers using absorbable suture.  The skin incision is closed using a subcuticular skin closure.     Valentina Gu. Roxy Manns MD 02/03/2014 2:10 PM

## 2014-02-03 NOTE — Interval H&P Note (Signed)
History and Physical Interval Note:  02/03/2014 10:52 AM  Douglas R Dasch Sr.  has presented today for surgery, with the diagnosis of SEVERE AS  The various methods of treatment have been discussed with the patient and family. After consideration of risks, benefits and other options for treatment, the patient has consented to  Procedure(s): TRANSCATHETER AORTIC VALVE REPLACEMENT, TRANSFEMORAL (N/A) INTRAOPERATIVE TRANSESOPHAGEAL ECHOCARDIOGRAM (N/A) as a surgical intervention .  The patient's history has been reviewed, patient examined, no change in status, stable for surgery.  I have reviewed the patient's chart and labs.  Questions were answered to the patient's satisfaction.    Pt seen and evaluated. He reports no change in symptoms since his evaluation by Dr Cyndia Bent. He has been evaluated by me, Dr Roxy Manns, and Dr Cyndia Bent, and is felt to be a reasonable candidate for TAVR for treatment of his severe aortic stenosis.   We have individually reviewed the risks, indications, and alternatives to TAVR with the patient as outlined in office notes. All questions answered. His studies have been reviewed by our multidisciplinary team and we plan to treat him with a 26 mm Sapien 3 valve from a left transfemoral approach.  Sherren Mocha MD

## 2014-02-03 NOTE — Op Note (Signed)
[facility]  TAVR OPERATIVE NOTE   Date of Procedure:  02/03/2014  Preoperative Diagnosis: Severe Aortic Stenosis   Postoperative Diagnosis: Same   Procedure:    Transcatheter Aortic Valve Replacement - Transfemoral Approach  Edwards Sapien 3 THV (size 26 mm, model # 9300TFX, serial # [id number])   Co-Surgeons:  Valentina Gu. Roxy Manns, MD and Sherren Mocha, MD  Assistants:   Gaye Pollack, MD and Lauree Chandler, MD  Anesthesiologist:  Oleta Mouse, MD  Echocardiographer:  Ena Dawley, MD  Pre-operative Echo Findings:  severe aortic stenosis  normal left ventricular systolic function  Mild MR, Mild TR  Post-operative Echo Findings:  trace paravalvular leak  normal left ventricular systolic function  Mild MR/TR  BRIEF CLINICAL NOTE AND INDICATIONS FOR SURGERY  80 year-old male with severe symptomatic AS presenting for TAVR.   During the course of the patient's preoperative work up they have been evaluated comprehensively by a multidisciplinary team of specialists coordinated through the [facility] in the [facility].  They have been demonstrated to suffer from symptomatic severe aortic stenosis as noted above. The patient has been counseled extensively as to the relative risks and benefits of all options for the treatment of severe aortic stenosis including long term medical therapy, conventional surgery for aortic valve replacement, and transcatheter aortic valve replacement.  The patient has been independently evaluated by two cardiac surgeons including Dr Roxy Manns and Dr. Cyndia Bent, and they are felt to be at high risk for conventional surgical aortic valve replacement based upon a predicted risk of mortality using the Society of Thor[employer] Surgeons risk calculator of 3.4%. Both surgeons indicated the patient's risk of morbidity and mortality would be much higher than that predicted by the STS risk  calculator and that the patient would be a poor candidate for conventional surgery (predicted risk of mortality >15% and/or predicted risk of permanent morbidity >50%) because of comorbidities including severe kyphoscoliosis with limited mobility and severe protein calorie malnutrition.   Based upon review of all of the patient's preoperative diagnostic tests they are felt to be candidate for transcatheter aortic valve replacement using the transfemoral approach as an alternative to high risk conventional surgery.    Following the decision to proceed with transcatheter aortic valve replacement, a discussion has been held regarding what types of management strategies would be attempted intraoperatively in the event of life-threatening complications, including whether or not the patient would be considered a candidate for the use of cardiopulmonary bypass and/or conversion to open sternotomy for attempted surgical intervention.  The patient has been advised of a variety of complications that might develop peculiar to this approach including but not limited to risks of death, stroke, paravalvular leak, aortic dissection or other major vascular complications, aortic annulus rupture, device embolization, cardiac rupture or perforation, acute myocardial infarction, arrhythmia, heart block or bradycardia requiring permanent pacemaker placement, congestive heart failure, respiratory failure, renal failure, pneumonia, infection, other late complications related to structural valve deterioration or migration, or other complications that might ultimately cause a temporary or permanent loss of functional independence or other long term morbidity.  The patient provides full informed consent for the procedure as described and all questions were answered preoperatively.    DETAILS OF THE OPERATIVE PROCEDURE  PREPARATION:    The patient is brought to the operating room on the above mentioned date and central monitoring  was established by the anesthesia team including placement of Swan-Ganz catheter and radial arterial line.  The patient is placed in the supine position on the operating table.  Intravenous antibiotics are administered. General endotracheal anesthesia is induced uneventfully. A Foley catheter is placed.  Baseline transesophageal echocardiogram was performed. The patient's chest, abdomen, both groins, and both lower extremities are prepared and draped in a sterile manner. A time out procedure is performed.   PERIPHERAL ACCESS:    Using the modified Seldinger technique, femoral arterial and venous access was obtained with placement of 6 Fr sheaths on the right side.  A pigtail diagnostic catheter was passed through the right femoral arterial sheath under fluoroscopic guidance into the aortic root.  A temporary transvenous pacemaker catheter was passed through the right femoral venous sheath under fluoroscopic guidance into the right ventricle.  The pacemaker was tested to ensure stable lead placement and pacemaker capture. Aortic root angiography was performed in order to determine the optimal angiographic angle for valve deployment.   TRANSFEMORAL ACCESS:   A left femoral arterial cutdown was performed by Dr Roxy Manns. Please see his separate operative note for details. The patient was heparinized systemically and ACT verified > 250 seconds.    A 14 Fr transfemoral E-sheath was introduced into the left femoral artery after progressively dilating over an Amplatz superstiff wire. The aortic valve was difficult to cross. AL-1, AL-2,and MPA catheters were used to direct a straight-tip wire, all unsuccessfully. Finally, a Lyman Speller aortic stenosis was successful in directing a straight wire across the valve into the left ventricle. This was exchanged out for a pigtail catheter and position was confirmed in the LV apex. Simultaneous LV and Ao pressures were recorded.  The pigtail catheter was then exchanged for an  Amplatz Extra-stiff wire in the LV apex. At that point, BAV was performed using a 23 mm valvuloplasty balloon.  Once optimal position was achieved, BAV was done under rapid ventricular pacing at 180 bpm. The patient recovered well hemodynamically.   TRANSCATHETER HEART VALVE DEPLOYMENT:  An Edwards Sapien 3 THV (size 26 mm) was prepared and crimped per manufacturer's guidelines, and the proper orientation of the valve is confirmed on the [employer] delivery system. The valve was advanced through the introducer sheath using normal technique until in an appropriate position in the abdominal aorta beyond the sheath tip. The balloon was then retracted and using the fine-tuning wheel was centered on the valve. The valve was then advanced across the aortic arch using appropriate flexion of the catheter. The valve was carefully positioned across the aortic valve annulus. The Commander catheter was retracted using normal technique. Once final position of the valve has been confirmed by angiographic assessment, the valve is deployed while temporarily holding ventilation and during rapid ventricular pacing to maintain systolic blood pressure < 50 mmHg and pulse pressure < 10 mmHg. The balloon inflation is held for >3 seconds after reaching full deployment volume. Once the balloon has fully deflated the balloon is retracted into the ascending aorta and valve function is assessed using TEE. There is felt to be trace paravalvular leak and no central aortic insufficiency.  The patient's hemodynamic recovery following valve deployment is good.  The deployment balloon and guidewire are both removed. Echo demostrated acceptable post-procedural gradients, stable mitral valve function, and trace AI.   PROCEDURE COMPLETION:  The sheath was then removed and arteriotomy repaired by Dr Roxy Manns. Please see his separate report for details. Distal abdominal aortography was performed to evaluate for any arterial injury related to  the procedure. There was no evidence dissection, perforation, or other  vascular injury in the abdominal aorta, iliac artery, or femoral artery.  Protamine was administered once femoral arterial repair was complete. The temporary pacemaker, pigtail catheters and femoral sheaths were removed with manual pressure used for hemostasis.   The patient tolerated the procedure well and is transported to the surgical intensive care in stable condition. There were no immediate intraoperative complications. All sponge instrument and needle counts are verified correct at completion of the operation.   No blood products were administered during the operation.  The patient received a total of 66 mL of intravenous contrast during the procedure.  Sherren Mocha MD 02/03/2014 3:30 PM

## 2014-02-03 NOTE — H&P (View-Only) (Signed)
Patient ID: Douglas Boyden Sr., male   DOB: 1928-01-26, 80 y.o.   MRN: [id number]   [facility] CONSULTATION REPORT  Referring Provider is Sherren Mocha, MD PCP is Elsie Stain, MD  Chief Complaint  Patient presents with  . Aortic Stenosis    TAVR CONSULT    HPI:  He is an 80 year old gentlemanan with a history of aortic stenosis, hypertension, hyperlipidemia, and chronic back pain with severe kyphoscoliosis of the thoracic spine who has been referred for surgical consultation to consider transcatheter aortic valve replacement for severe symptomatic aortic stenosis. Over the past year the patient has noticed a progressive decline in his exercise tolerance.He reports that his primary limitation remains chronic pain in his back related to severe kyphoscoliosis and arthritis. He has also noticed exertional shortness of breath and progressive fatigue. He has lost his appetite and experienced at least 30 pounds weight loss over the past year. He denies any history of chest pain with exertion or at rest. He denies any history of resting shortness of breath, PND, orthopnea, palpitations, dizzy spells, or syncope. He has developed chronic bilateral lower extremity edema. He states that with normal activity his breathing has not limited him too much, but he has cut back his physical activity considerably to avoid symptoms.  He was seen in followup by Dr. Rockey Situ, and recent transthoracic echocardiogram demonstrates progression of the severity of aortic stenosis with peak velocity across the aortic valve measured in excess of 4 m/s corresponding to peak and mean transvalvular gradients of 78 and 47 mm mercury, respectively. Left ventricular function remains normal. The patient underwent diagnostic coronary angiography demonstrating mild non-obstructive disease.   Past Medical History  Diagnosis Date  . Hyperlipidemia  09/05/95  . Diverticulosis of colon 06/05/2002  . Benign prostatic hypertrophy 10/18/00  . Hypertension 05/19/03  . CHF (congestive heart failure) 04/21/03  . Scoliosis   . Colon polyps 06/05/2002    path could not be found   . Chronic diastolic congestive heart failure   . Aortic valve stenosis 09/10/2012  . Back pain     Past Surgical History  Procedure Laterality Date  . Knee arthroscopy  06/1997    right  . Colonoscopy w/ biopsies  05/2002  . Flexible sigmoidoscopy  07/2005  . Cardiac catheterization  10/27/2013  . Tee without cardioversion      Family History  Problem Relation Age of Onset  . Stroke Father   . Diabetes Brother   . Hypertension Other   . Hip fracture Sister     after a fall  . Diabetes Brother   . Coronary artery disease Brother     carotid stenosis  . Colon cancer Neg Hx   . Prostate cancer Neg Hx     History   Social History  . Marital Status: Married    Spouse Name: N/A    Number of Children: N/A  . Years of Education: N/A   Occupational History  . retired     [employer]   Social History Main Topics  . Smoking status: Former Smoker -- 1.00 packs/day for 20 years    Types: Cigarettes    Quit date: 01/11/1971  . Smokeless tobacco: Former Systems developer     Comment: quit 40 years ago  . Alcohol Use: No  . Drug Use: No  . Sexual Activity: No   Other Topics Concern  . Not on file  Social History Narrative   Occupation: Retired Secretary/administrator   Married (second marriage), lives with wife   Ellis Savage '51-'54.     Son died Jul 18, 2008, he had cirrhosis.      Current Outpatient Prescriptions  Medication Sig Dispense Refill  . aspirin 81 MG EC tablet Take 81 mg by mouth daily.      Marland Kitchen atorvastatin (LIPITOR) 20 MG tablet Take 1 tablet (20 mg total) by mouth at bedtime. 30 tablet 12  . furosemide (LASIX) 20 MG tablet Take 1 tablet (20 mg total) by mouth daily as needed for edema. 30 tablet 1  . metoprolol tartrate (LOPRESSOR) 25 MG tablet Take  1 tablet (25 mg total) by mouth 2 (two) times daily. 60 tablet 6  . Multiple Vitamin (MULTIVITAMIN) tablet Take 1 tablet by mouth daily.      . multivitamin-lutein (OCUVITE-LUTEIN) CAPS Take 1 capsule by mouth daily.      . Omega-3 Fatty Acids (FISH OIL) 1000 MG CAPS Take 1,000 mg by mouth daily.     Marland Kitchen omeprazole (PRILOSEC) 20 MG capsule Take 1 capsule (20 mg total) by mouth daily. 30 capsule 3  . Oxycodone HCl 10 MG TABS Take 5-10 mg by mouth 3 (three) times daily as needed (for pain).     . TRAVATAN Z 0.004 % SOLN ophthalmic solution Place 1 drop into the right eye at bedtime.     . vitamin C (ASCORBIC ACID) 500 MG tablet Take 1,000 mg by mouth daily.     . vitamin E 400 UNIT capsule Take 400 Units by mouth daily.      . finasteride (PROSCAR) 5 MG tablet Take 5 mg by mouth daily.     No current facility-administered medications for this visit.    No Known Allergies    Review of Systems:  General:decreased appetite, decreased energy, no weight gain, + significant weight loss, no fever Cardiac:no chest pain with exertion, no chest pain at rest, + SOB with exertion, no resting SOB, no PND, no orthopnea, no palpitations, no arrhythmia, no atrial fibrillation, + LE edema, no dizzy spells, no syncope Respiratory:Mild exertional shortness of breath, no home oxygen, no productive cough, no dry cough, no bronchitis, no wheezing, no hemoptysis, no asthma, no pain with inspiration or cough, no sleep apnea, no CPAP at night GI:no difficulty swallowing, no reflux, no frequent heartburn, no hiatal hernia, no abdominal pain, + constipation, no diarrhea, no hematochezia, no hematemesis, no melena GU:no dysuria, no frequency, no urinary tract infection, no hematuria, no enlarged prostate, no kidney stones, no kidney  disease Vascular:no pain suggestive of claudication, no pain in feet, no leg cramps, + varicose veins, no DVT, no non-healing foot ulcer Neuro:no stroke, no TIA's, no seizures, no headaches, no temporary blindness one eye, no slurred speech, no peripheral neuropathy, + chronic pain, + some instability of gait, no memory/cognitive dysfunction Musculoskeletal:+ arthritis, no joint swelling, no myalgias, + difficulty walking, limited mobility  Skin:no rash, no itching, no skin infections, no pressure sores or ulcerations Psych:no anxiety, no depression, no nervousness, no unusual recent stress Eyes:+ blurry vision, no floaters, no recent vision changes, + wears glasses or contacts ENT:no hearing loss, no loose or painful teeth, + full set dentures Hematologic:+ easy bruising, no abnormal bleeding, no clotting disorder, no frequent epistaxis Endocrine:no diabetes, does not check CBG's at home           Physical Exam:   BP 136/72 mmHg  Pulse 68  Resp 16  Ht 5'  10" (1.778 m)  Wt 138 lb (62.596 kg)  BMI 19.80 kg/m2  SpO2 97%  General:             Frail-appearing thin gentleman in no distress  HEENT:  NCAT, PERLA, EOMI, oropharynx clear.  Neck:   no JVD, no bruits, no adenopathy or thyromegaly  Chest:   clear to auscultation, symmetrical breath sounds, no wheezes, no rhonchi   CV:   RRR, grade III/VI crescendo/decrescendo murmur heard best at RSB,  no diastolic murmur  Abdomen:  soft, non-tender, no masses or organomegaly  Extremities:  warm, well-perfused, pulses palpable, no LE edema  Rectal/GU  Deferred  Neuro:   Grossly non-focal and symmetrical throughout  Skin:   Clean and dry, no  rashes, no breakdown   Diagnostic Tests:  Transthoracic Echocardiography  Patient: Jamiel, Goncalves MR #: [id number] Study Date: 12/02/2013 Gender: M Age: 73 Height: 177.8 cm Weight: 65.3 kg BSA: 1.79 m^2 Pt. Status: Room:  ATTENDING Default, Provider [phone] Dannielle Karvonen, Tim REFERRING [place], Tim SONOGRAPHER Timmothy Sours, RDCS, RVT PERFORMING [facility], [place]  cc:  ------------------------------------------------------------------- LV EF: 60% - 65%  ------------------------------------------------------------------- History: PMH: Previous echocardiogram done 10/01/2012 showed normal LV systolic function with an estimated ejection fraction of 60-65%. there was severe aortic valve stenosis with a mean gradient of 41 mmHg. Mild to moderate mitral valve regurgitation. Pulmonary artery peak systolic pressure was mildly increased at 45 mmHg. Acquired from the patient and from the patient&'s chart. Dyspnea and murmur. Aortic valve disease. Risk factors: Former tobacco use. Hypertension. Dyslipidemia.  ------------------------------------------------------------------- Study Conclusions  - Left ventricle: The cavity size was normal. There was moderate concentric hypertrophy. Systolic function was normal. The estimated ejection fraction was in the range of 60% to 65%. Wall motion was normal; there were no regional wall motion abnormalities. Features are consistent with a pseudonormal left ventricular filling pattern, with concomitant abnormal relaxation and increased filling pressure (grade 2 diastolic dysfunction). - Aortic valve: There was severe stenosis. There was mild regurgitation. Mean gradient (S): 47 mm Hg. Peak gradient (S): 78 mm Hg. Valve area (VTI): 0.69 cm^2. - Mitral valve: There was moderate regurgitation. - Left atrium: The atrium was mildly to moderately dilated. - Right atrium: The atrium was mildly dilated. - Tricuspid valve: There was moderate  regurgitation. - Pulmonary arteries: Systolic pressure was mildly increased. PA peak pressure: 43 mm Hg (S).  Transthoracic echocardiography. M-mode, complete 2D, spectral Doppler, and color Doppler. Birthdate: Patient birthdate: 01/12/1928. Age: Patient is 80 yr old. Sex: Gender: male. BMI: 20.7 kg/m^2. Blood pressure: 140/58 Patient status: Outpatient. Study date: Study date: 12/02/2013. Study time: 11:17 AM.  -------------------------------------------------------------------  ------------------------------------------------------------------- Left ventricle: The cavity size was normal. There was moderate concentric hypertrophy. Systolic function was normal. The estimated ejection fraction was in the range of 60% to 65%. Wall motion was normal; there were no regional wall motion abnormalities. Features are consistent with a pseudonormal left ventricular filling pattern, with concomitant abnormal relaxation and increased filling pressure (grade 2 diastolic dysfunction).  ------------------------------------------------------------------- Aortic valve: Trileaflet; moderately thickened, severely calcified leaflets. Mobility was not restricted. Doppler: There was severe stenosis. There was mild regurgitation. VTI ratio of LVOT to aortic valve: 0.21. Valve area (VTI): 0.69 cm^2. Indexed valve area (VTI): 0.39 cm^2/m^2. Peak velocity ratio of LVOT to aortic valve: 0.2. Valve area (Vmax): 0.67 cm^2. Indexed valve area (Vmax): 0.37 cm^2/m^2. Mean velocity ratio of LVOT to aortic valve: 0.21. Valve area (Vmean): 0.79 cm^2. Indexed valve area (Vmean): 0.44 cm^2/m^2. Mean gradient (  S): 47 mm Hg. Peak gradient (S): 78 mm Hg.  ------------------------------------------------------------------- Aorta: Aortic root: The aortic root was normal in size.  ------------------------------------------------------------------- Mitral valve: Structurally normal valve. Mobility was not restricted.  Doppler: Transvalvular velocity was within the normal range. There was no evidence for stenosis. There was moderate regurgitation. Peak gradient (D): 7 mm Hg.  ------------------------------------------------------------------- Left atrium: The atrium was mildly to moderately dilated.  ------------------------------------------------------------------- Right ventricle: The cavity size was normal. Wall thickness was normal. Systolic function was normal.  ------------------------------------------------------------------- Pulmonic valve: Doppler: Transvalvular velocity was within the normal range. There was no evidence for stenosis. There was mild regurgitation.  ------------------------------------------------------------------- Tricuspid valve: Structurally normal valve. Doppler: Transvalvular velocity was within the normal range. There was moderate regurgitation.  ------------------------------------------------------------------- Pulmonary artery: The main pulmonary artery was normal-sized. Systolic pressure was mildly increased.  ------------------------------------------------------------------- Right atrium: The atrium was mildly dilated.  ------------------------------------------------------------------- Pericardium: There was no pericardial effusion.  ------------------------------------------------------------------- Systemic veins: Inferior vena cava: The vessel was normal in size.  ------------------------------------------------------------------- Measurements  Left ventricle Value Reference LV ID, ED, PLAX chordal 43.8 mm 43 - 52 LV ID, ES, PLAX chordal 27.9 mm 23 - 38 LV fx shortening, PLAX chordal 36 % >=29 LV PW thickness, ED 12.7 mm --------- IVS/LV PW ratio, ED 1.08 <=1.3 Stroke volume, 2D 89 ml --------- Stroke volume/bsa, 2D 50 ml/m^2 --------- LV ejection fraction, 1-p A4C 61 % --------- LV end-diastolic volume, 2-p 91 ml --------- LV end-systolic  volume, 2-p 41 ml --------- LV ejection fraction, 2-p 55 % --------- Stroke volume, 2-p 50 ml --------- LV end-diastolic volume/bsa, 2-p 51 ml/m^2 --------- LV end-systolic volume/bsa, 2-p 23 ml/m^2 --------- Stroke volume/bsa, 2-p 27.9 ml/m^2 ---------  Ventricular septum Value Reference IVS thickness, ED 13.7 mm ---------  LVOT Value Reference LVOT ID, S 22 mm --------- LVOT area 3.8 cm^2 --------- LVOT peak velocity, S 77.5 cm/s --------- LVOT mean velocity, S 52.6 cm/s --------- LVOT VTI, S 23.3 cm ---------  Aortic valve Value Reference Aortic valve peak velocity, S 389 cm/s --------- Aortic valve mean velocity, S 254 cm/s --------- Aortic valve VTI, S 113 cm --------- Aortic mean gradient, S 47 mm Hg --------- Aortic peak gradient, S 78 mm Hg --------- VTI ratio, LVOT/AV 0.21 --------- Aortic valve area, VTI 0.69 cm^2 --------- Aortic valve area/bsa, VTI 0.39 cm^2/m^2 --------- Velocity ratio, peak, LVOT/AV 0.2 --------- Aortic valve area, peak velocity 0.67 cm^2 --------- Aortic valve area/bsa, peak 0.37 cm^2/m^2 --------- velocity Velocity ratio, mean, LVOT/AV 0.21 --------- Aortic valve area, mean velocity 0.79 cm^2 --------- Aortic valve area/bsa, mean 0.44 cm^2/m^2 --------- velocity  Aorta Value Reference Aortic root ID, ED 30 mm ---------  Left atrium Value Reference LA ID, A-P, ES 44 mm --------- LA ID/bsa, A-P (H) 2.46 cm/m^2 <=2.2 LA volume, S 90 ml --------- LA volume/bsa, S 50.3 ml/m^2 ---------  Mitral valve Value Reference Mitral E-wave peak velocity 133 cm/s --------- Mitral A-wave peak velocity 81.9 cm/s --------- Mitral deceleration time 190 ms 150 - 230 Mitral peak gradient, D 7 mm Hg --------- Mitral E/A ratio, peak 1.6 --------- Mitral maximal regurg velocity, 745 cm/s --------- PISA Mitral regurg VTI, PISA 299 cm --------- Mitral ERO, PISA 0.05 cm^2 --------- Mitral regurg volume, PISA 15 ml ---------  Pulmonary arteries Value  Reference PA pressure, S, DP (H) 43 mm Hg <=30  Tricuspid valve Value Reference Tricuspid peak RV-RA gradient 38 mm Hg --------- Tricuspid maximal regurg 309 cm/s --------- velocity, PISA  Right ventricle Value Reference RV ID, ED, PLAX 32.7 mm  19 - 38  Pulmonic valve Value Reference Pulmonic valve peak velocity, S 91 cm/s --------- Pulmonic regurg velocity, ED 145 cm/s --------- Pulmonic regurg gradient, ED 8 mm Hg ---------  Legend: (L) and (H) mark values outside specified reference range.  ------------------------------------------------------------------- Prepared and Electronically Authenticated by  Kathlyn Sacramento, MD 2015-09-15T17:30:05   CARDIAC CATHETERIZATION  Report from cardiac catheterization performed 11/13/2013 at [facility]. The patient underwent diagnostic cardiac catheterization without left ventriculogram or measurement of transvalvular gradient across the aortic valve. Right heart catheterization was not performed. By report "coronary angiography demonstrated minor luminal irregularities" with "mild to moderate proximal LAD disease."  Cardiac TAVR CT  TECHNIQUE: The patient was scanned on a Philips 256 scanner. A 120 kV retrospective scan was triggered in the descending thoracic aorta at 111 HU's. Gantry rotation speed was 270 msecs and collimation was .9 mm. 15 mg of iv Metoprolol and no nitro were given. The 3D data set was reconstructed in 5% intervals of the R-R cycle. Systolic and diastolic phases were analyzed on a dedicated work station using MPR, MIP and VRT modes. The patient received 80 cc of contrast.  FINDINGS: Aortic Valve: Trileaflet, moderately calcified leaflet with moderately restricted leaflet separation.  Aorta: Normal size of the aortic root and thoracic aorta. There are mild diffuse calcifications in the ascending and descending aorta and moderate calcifications around the origin of the brachiocephalic arteries.  Sinotubular  Junction: 29 x 28 mm  Ascending Thoracic Aorta: 34 x 32 mm  Aortic Arch: 27 x 25 mm  Descending Thoracic Aorta: 25 x 25 mm  Sinus of Valsalva Measurements:  Non-coronary: 30 mm  Right -coronary: 29 mm  Left -coronary: 32 mm  Coronary Artery Height above Annulus:  Left Main: 11 mm  Right Coronary: 13 mm  Virtual Basal Annulus Measurements:  Maximum/Minimum Diameter: 29 x 24 mm  Perimeter: 87 mm  Area: 530 mm2  Coronary Arteries: Normal origin. Right and left co-dominance.  RCA gives rise to PDA. No plague.  LM has distal calcifications continuing into proximal and mid LAD associated with 25-50% stenosis at the proximal LAD then 0-25% stenosis.  D1 has no plaque.  LCX gives rise to 3 OMs and PLVB. There is no plaque.  Optimum Fluoroscopic Angle for Delivery: LAO 10 CAU 14  Annulus to sheath tip distance: 68 mm  IMPRESSION: 1. Trileaflet, moderately calcified aortic valve with moderately restricted leaflet separation and measurements suitable for delivery of hard 26 mm Edward-SAPIEN XT TAVR.  2. Non-obstructive CAD.  3. Optimum Fluoroscopic Angle for Delivery: LAO 10 CAU [place]   Electronically Signed  By: Ena Dawley  On: 01/04/2014 08:08      Study Result     EXAM: OVER-READ INTERPRETATION CT CHEST  The following report is an over-read performed by radiologist Dr. Norlene Duel [facility] on 01/01/2014. This over-read does not include interpretation of cardiac or coronary anatomy or pathology. The coronary calcium score/coronary CTA interpretation by the cardiologist is attached.  COMPARISON: None.  FINDINGS: Bilateral calcified pleural plaques are identified compatible with previous asbestos exposure. There is a moderate volume right pleural effusion present. Nodule within the left base measures 4 mm. No airspace consolidation identified.  Within the right upper lobe there is a nodule measuring 4 mm, image 18/series 412.  No mediastinal or hilar adenopathy identified. There is no axillary adenopathy noted.  Incidental imaging through the upper abdomen shows several cysts within the liver which appears similar to CT the abdomen dated  10/17/2013. Several small low attenuation structures are again noted within the the visualized portions of the adrenal glands are unremarkable. There is a scoliosis deformity involving the thoracic spine with multi level degenerative disc disease.  IMPRESSION: 1. Calcified pleural plaques compatible with prior asbestos exposure. 2. Right pleural effusion 3. 4 mm nodule identified within the right upper lobe and left base. If the patient is at high risk for bronchogenic carcinoma, follow-up chest CT at 1year is recommended. If the patient is at low risk, no follow-up is needed. This recommendation follows the consensus statement: Guidelines for Management of Small Pulmonary Nodules Detected on CT Scans: A Statement from the [place] as published in Radiology 2005; 237:395-400. 4. Scoliosis and multi level degenerative disc disease. 5. Liver cysts.  Electronically Signed: By: Kerby Moors M.D. On: 01/01/2014 11:11        CTA ABDOMEN AND PELVIS WITHOUT AND WITH CONTRAST  TECHNIQUE: Multidetector CT imaging of the abdomen and pelvis was performed using the standard protocol during bolus administration of intravenous contrast. Multiplanar reconstructed images and MIPs were obtained and reviewed to evaluate the vascular anatomy.  CONTRAST: 160 mL of Omnipaque 350.  COMPARISON: CT of the abdomen and pelvis 10/17/2013.  FINDINGS: CT ABDOMEN AND PELVIS FINDINGS  Lower chest: Calcified pleural plaques in the thorax bilaterally (left greater than right). Moderate right-sided pleural effusion incompletely visualized, layering dependently. Areas of  mild scarring and/or subsegmental atelectasis in the lung bases bilaterally. Calcifications of the mitral annulus.  Hepatobiliary: Multiple hepatic lesions, the majority of which are too small to definitively characterize. The larger lesions are all compatible with simple cysts, with the largest of these cysts measuring up to 5.0 x 6.4 cm in the central aspect of segment 4A of the liver. No intra or extrahepatic biliary ductal dilatation. Gallbladder is normal in appearance.  Pancreas: Pancreas is normal in appearance.  Spleen: Sub cm low-attenuation lesion in the spleen is too small to characterize, but has a benign appearance.  Adrenals/Urinary Tract: Adrenal glands are normal bilaterally. Right kidney is normal in appearance. Sub cm lesion in the lower pole of the left kidney is too small to definitively characterize, but measures approximately 156 HU, which may suggest an enhancing lesion. 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 Urinary bladder is normal in appearance.  Stomach/Bowel: Stomach is largely decompressed, but unremarkable in appearance. No pathologic dilatation of the small bowel or colon.  Vascular/Lymphatic: Extensive atherosclerosis throughout the abdominal and pelvic vasculature, with vascular findings and measurements pertinent to potential TAVR procedure, as detailed below. No pathologically enlarged lymph nodes are noted in the abdomen or pelvis.  Reproductive: Prostate gland is unremarkable in appearance.  Other: Small volume of ascites in the low anatomic pelvis is intermediate attenuation, suggesting proteinaceous contents. No pneumoperitoneum.  Musculoskeletal: S-shaped thoracolumbar scoliosis convex to the right near the thoracolumbar junction and to the left in the lumbar region. There are no aggressive  appearing lytic or blastic lesions noted in the visualized portions of the skeleton.  VASCULAR MEASUREMENTS PERTINENT TO TAVR:  AORTA:  Minimal Aortic Diameter - 11 x 15 mm  Severity of Aortic Calcification - moderate to severe  RIGHT PELVIS:  Right Common Iliac Artery -  Minimal Diameter - 5.6 x 7.0 mm  Tortuosity - mild  Calcification - moderate-severe  Right External Iliac Artery -  Minimal Diameter - 7.9 x 8.1  mm  Tortuosity - mild-moderate  Calcification - mild  Right Common Femoral Artery -  Minimal Diameter - 2.8 x 7.2 mm in the mid to distal vessel  Tortuosity - mild  Calcification - moderate  (more proximally the right common femoral artery measures 8.8 x 6.2 mm)  LEFT PELVIS:  Left Common Iliac Artery -  Minimal Diameter - 7.0 x 9.4 mm  Tortuosity - mild  Calcification - moderate  Left External Iliac Artery -  Minimal Diameter - 8.7 x 8.6 mm  Tortuosity - mild  Calcification - mild  Left Common Femoral Artery -  Minimal Diameter - 8.2 x 4.2 mm in the mid to distal vessel  Tortuosity - mild  Calcification - mild-moderate  (more proximally the left common femoral artery measures 6.7 x 7.5 mm)  Review of the MIP images confirms the above findings.  IMPRESSION: 1. Vascular findings and measurements pertinent to potential TAVR procedure, as detailed above. This patient may have suitable pelvic arterial access on either side, however, attention to the measurements and findings in the common femoral arteries is recommended, as the mid to distal common femoral arteries are compromised by eccentric calcified plaque bilaterally (right worse than left), while the proximal aspects of the vessels are more suitable for arterial access perhaps via cutdown. 2. Moderate right-sided pleural effusion incompletely visualized. This appears to be simple, layering dependently. 3. Calcified pleural plaques  bilaterally, suggestive of asbestos related pleural disease. 4. Sub cm lesion in the lower pole the left kidney is too small to definitively characterize, but measures approximately 156 HU, which may suggest an enhancing lesion. Further characterization with nonemergent MRI of the abdomen with and without IV gadolinium is suggested in the near future for more definitive evaluation. 5. Multiple hepatic lesions, the majority of which are too small to definitively characterize. The larger of these lesions are compatible with simple cysts, in the smaller lesions also favored to represent multiple small cysts. 6. Small volume of intermediate attenuation ascites, presumably proteinaceous fluid. This is of uncertain etiology and significance. 7. Additional incidental findings, as above.   Electronically Signed  By: Vinnie Langton M.D.  On: 01/01/2014 16:18   STS Risk Calculator  ProcedureAVR  Risk of Mortality3.4% Morbidity or Mortality18.9% Prolonged LOS7.8% Short LOS29.5% Permanent Stroke2.1% Prolonged Vent Support11.0% DSW Infection0.3% Renal Failure4.9% Reoperation8.5%   Impression:  The patient has severe symptomatic aortic stenosis with normal left ventricular function. By report the patient does not have significant coronary artery disease. He would be at very high risk for conventional open surgical AVR due to severe kyphoscoliosis and limited mobility as well as severe protein calorie malnutrition with weight loss > 25% of his bodyweight over the past year. I agree that the risks associated with conventional surgical aortic valve  replacement would be considerably higher than that predicted using the STS risk calculator, with anticipated risk of mortality approaching 15% and risk of significant morbidity potentially in excess of 50%. CT angiography reveals that the patient has anatomical characteristics that appear relatively favorable for transcatheter aortic valve replacement, potentially via transfemoral approach on the left side using the relatively low profile Edwards Sapien 3 transcatheter heart valve. The patient and his close friend were counseled at length regarding treatment alternatives for management of severe symptomatic aortic stenosis. Alternative approaches such as conventional aortic valve replacement, transcatheter aortic valve replacement, and palliative medical therapy were compared and contrasted at length. The risks associated with conventional surgical aortic valve replacement were been discussed in detail, as  were expectations for post-operative convalescence. Long-term prognosis with medical therapy was discussed. He would like to proceed with TAVR.   Plan:  He will be scheduled for TAVR on Tuesday 02/03/2014.    Gaye Pollack, MD 01/28/2014

## 2014-02-03 NOTE — Anesthesia Postprocedure Evaluation (Signed)
  Anesthesia Post-op Note  Patient: Douglas R Carlo Sr.  Procedure(s) Performed: Procedure(s): TRANSCATHETER AORTIC VALVE REPLACEMENT, TRANSFEMORAL (N/A) INTRAOPERATIVE TRANSESOPHAGEAL ECHOCARDIOGRAM (N/A)  Patient Location: ICU  Anesthesia Type:General  Level of Consciousness: awake  Airway and Oxygen Therapy: Patient Spontanous Breathing  Post-op Pain: none  Post-op Assessment: Post-op Vital signs reviewed, Patient's Cardiovascular Status Stable, Respiratory Function Stable, Patent Airway, No signs of Nausea or vomiting and Pain level controlled  Post-op Vital Signs: Reviewed  Last Vitals:  Filed Vitals:   02/03/14 1645  BP: 148/63  Pulse: 74  Temp: 36.1 C  Resp: 15    Complications: No apparent anesthesia complications

## 2014-02-03 NOTE — Anesthesia Preprocedure Evaluation (Signed)
Anesthesia Evaluation  Patient identified by MRN, date of birth, ID band Patient awake    Reviewed: Allergy & Precautions, H&P , NPO status , Patient's Chart, lab work & pertinent test results, reviewed documented beta blocker date and time   History of Anesthesia Complications Negative for: history of anesthetic complications  Airway Mallampati: I  TM Distance: >3 FB Neck ROM: Full    Dental  (+) Upper Dentures, Lower Dentures   Pulmonary shortness of breath, neg sleep apnea, neg COPDformer smoker,  breath sounds clear to auscultation        Cardiovascular hypertension, Pt. on medications and Pt. on home beta blockers Rhythm:Regular + Systolic murmurs    Neuro/Psych negative neurological ROS  negative psych ROS   GI/Hepatic Neg liver ROS, GERD-  Medicated and Controlled,  Endo/Other  negative endocrine ROS  Renal/GU      Musculoskeletal  (+) Arthritis -, Osteoarthritis,    Abdominal   Peds  Hematology  (+) anemia ,   Anesthesia Other Findings   Reproductive/Obstetrics                             Anesthesia Physical Anesthesia Plan  ASA: IV  Anesthesia Plan: General   Post-op Pain Management:    Induction: Intravenous  Airway Management Planned: Oral ETT  Additional Equipment: Arterial line, TEE, CVP and PA Cath  Intra-op Plan:   Post-operative Plan: Extubation in OR and Possible Post-op intubation/ventilation  Informed Consent: I have reviewed the patients History and Physical, chart, labs and discussed the procedure including the risks, benefits and alternatives for the proposed anesthesia with the patient or authorized representative who has indicated his/her understanding and acceptance.     Plan Discussed with: CRNA and Surgeon  Anesthesia Plan Comments:         Anesthesia Quick Evaluation

## 2014-02-03 NOTE — Anesthesia Procedure Notes (Addendum)
Procedure Name: Intubation Date/Time: 02/03/2014 11:49 AM Performed by: Sampson Si E Pre-anesthesia Checklist: Patient identified, Emergency Drugs available, Suction available, Patient being monitored and Timeout performed Patient Re-evaluated:Patient Re-evaluated prior to inductionOxygen Delivery Method: Circle system utilized Preoxygenation: Pre-oxygenation with 100% oxygen Intubation Type: IV induction Ventilation: Oral airway inserted - appropriate to patient size Laryngoscope Size: Mac and 4 Grade View: Grade II Tube type: Oral Number of attempts: 1 Placement Confirmation: ETT inserted through vocal cords under direct vision,  positive ETCO2 and breath sounds checked- equal and bilateral Secured at: 22 cm Tube secured with: Tape Dental Injury: Teeth and Oropharynx as per pre-operative assessment  Comments: Placed by Gunnar Bulla, SRNA under the supervision of Anesthesiologist and CRNA.

## 2014-02-04 ENCOUNTER — Inpatient Hospital Stay (HOSPITAL_COMMUNITY): Payer: Medicare Other

## 2014-02-04 DIAGNOSIS — Z954 Presence of other heart-valve replacement: Secondary | ICD-10-CM

## 2014-02-04 DIAGNOSIS — I359 Nonrheumatic aortic valve disorder, unspecified: Secondary | ICD-10-CM

## 2014-02-04 DIAGNOSIS — I350 Nonrheumatic aortic (valve) stenosis: Principal | ICD-10-CM

## 2014-02-04 LAB — GLUCOSE, CAPILLARY
GLUCOSE-CAPILLARY: 113 mg/dL — AB (ref 70–99)
GLUCOSE-CAPILLARY: 141 mg/dL — AB (ref 70–99)
Glucose-Capillary: 96 mg/dL (ref 70–99)
Glucose-Capillary: 144 mg/dL — ABNORMAL HIGH (ref 70–99)
Glucose-Capillary: 118 mg/dL — ABNORMAL HIGH (ref 70–99)
Glucose-Capillary: 101 mg/dL — ABNORMAL HIGH (ref 70–99)

## 2014-02-04 LAB — CBC
HCT: 28 % — ABNORMAL LOW (ref 39.0–52.0)
Hemoglobin: 9.4 g/dL — ABNORMAL LOW (ref 13.0–17.0)
MCH: 30.2 pg (ref 26.0–34.0)
MCHC: 33.6 g/dL (ref 30.0–36.0)
MCV: 90 fL (ref 78.0–100.0)
Platelets: 85 K/uL — ABNORMAL LOW (ref 150–400)
RBC: 3.11 MIL/uL — ABNORMAL LOW (ref 4.22–5.81)
RDW: 13.5 % (ref 11.5–15.5)
WBC: 5.2 K/uL (ref 4.0–10.5)

## 2014-02-04 LAB — BASIC METABOLIC PANEL WITH GFR
Anion gap: 6 (ref 5–15)
BUN: 15 mg/dL (ref 6–23)
CO2: 28 meq/L (ref 19–32)
Calcium: 8.4 mg/dL (ref 8.4–10.5)
Chloride: 102 meq/L (ref 96–112)
Creatinine, Ser: 0.76 mg/dL (ref 0.50–1.35)
GFR calc Af Amer: >90 mL/min
GFR calc non Af Amer: 81 mL/min — ABNORMAL LOW
Glucose, Bld: 106 mg/dL — ABNORMAL HIGH (ref 70–99)
Potassium: 4.1 meq/L (ref 3.7–5.3)
Sodium: 136 meq/L — ABNORMAL LOW (ref 137–147)

## 2014-02-04 LAB — MAGNESIUM: Magnesium: 1.9 mg/dL (ref 1.5–2.5)

## 2014-02-04 MED ORDER — OXYCODONE HCL 5 MG PO TABS: 5.0000 mg | ORAL_TABLET | ORAL | Status: DC | PRN

## 2014-02-04 MED ORDER — SODIUM CHLORIDE 0.9 % IV SOLN: 250.0000 mL | INTRAVENOUS | Status: DC | PRN

## 2014-02-04 MED ORDER — POTASSIUM CHLORIDE CRYS ER 20 MEQ PO TBCR
20.0000 meq | EXTENDED_RELEASE_TABLET | Freq: Every day | ORAL | Status: DC
  Administered 2014-02-05 – 2014-02-06 (×2): 20 meq via ORAL
  Filled 2014-02-04 (×2): qty 1

## 2014-02-04 MED ORDER — FUROSEMIDE 20 MG PO TABS
20.0000 mg | ORAL_TABLET | Freq: Every day | ORAL | Status: DC
  Administered 2014-02-04 – 2014-02-06 (×3): 20 mg via ORAL
  Filled 2014-02-04 (×3): qty 1

## 2014-02-04 MED ORDER — SODIUM CHLORIDE 0.9 % IV SOLN: INTRAVENOUS | Status: DC | PRN

## 2014-02-04 MED ORDER — ASPIRIN EC 81 MG PO TBEC
81.0000 mg | DELAYED_RELEASE_TABLET | Freq: Every day | ORAL | Status: DC
  Administered 2014-02-04 – 2014-02-06 (×3): 81 mg via ORAL
  Filled 2014-02-04 (×3): qty 1

## 2014-02-04 MED ORDER — SODIUM CHLORIDE 0.9 % IJ SOLN: 3.0000 mL | INTRAMUSCULAR | Status: DC | PRN

## 2014-02-04 MED ORDER — MOVING RIGHT ALONG BOOK
Freq: Once | Status: AC
  Administered 2014-02-04: 13:00:00
  Filled 2014-02-04: qty 1

## 2014-02-04 MED ORDER — SODIUM CHLORIDE 0.9 % IJ SOLN
3.0000 mL | Freq: Two times a day (BID) | INTRAMUSCULAR | Status: DC
  Administered 2014-02-04 – 2014-02-06 (×6): 3 mL via INTRAVENOUS

## 2014-02-04 MED ORDER — OXYCODONE HCL 10 MG PO TABS: 5.0000 mg | ORAL_TABLET | Freq: Three times a day (TID) | ORAL | Status: DC | PRN

## 2014-02-04 MED ORDER — METOPROLOL TARTRATE 25 MG PO TABS
25.0000 mg | ORAL_TABLET | Freq: Two times a day (BID) | ORAL | Status: DC
  Administered 2014-02-04 – 2014-02-06 (×5): 25 mg via ORAL
  Filled 2014-02-04 (×6): qty 1

## 2014-02-04 MED FILL — Norepinephrine Bitartrate IV Soln 1 MG/ML (Base Equivalent): INTRAVENOUS | Qty: 4 | Status: AC

## 2014-02-04 MED FILL — Potassium Chloride Inj 2 mEq/ML: INTRAVENOUS | Qty: 40 | Status: AC

## 2014-02-04 MED FILL — Dextrose Inj 5%: INTRAVENOUS | Qty: 250 | Status: AC

## 2014-02-04 MED FILL — Magnesium Sulfate Inj 50%: INTRAMUSCULAR | Qty: 10 | Status: AC

## 2014-02-04 MED FILL — Insulin Regular (Human) Inj 100 Unit/ML: INTRAMUSCULAR | Qty: 1 | Status: AC

## 2014-02-04 MED FILL — Heparin Sodium (Porcine) Inj 1000 Unit/ML: INTRAMUSCULAR | Qty: 30 | Status: AC

## 2014-02-04 NOTE — Progress Notes (Signed)
    Subjective:  Patient is feeling okay. He has some soreness in his left groin. Otherwise no chest pain or shortness of breath.  Objective:  Vital Signs in the last 24 hours: Temp:  [96.8 F (36 C)-98.5 F (36.9 C)] 98.3 F (36.8 C) (11/18 1550) Pulse Rate:  [66-84] 70 (11/18 1400) Resp:  [8-31] 11 (11/18 1400) BP: (98-183)/(43-93) 149/45 mmHg (11/18 1500) SpO2:  [96 %-100 %] 100 % (11/18 1400) Arterial Line BP: (105-226)/(26-84) 161/45 mmHg (11/18 1000) Weight:  [134 lb 11.2 oz (61.1 kg)-136 lb 11 oz (62 kg)] 134 lb 11.2 oz (61.1 kg) (11/18 0500)  Intake/Output from previous day: 11/17 0701 - 11/18 0700 In: 1898.2 [P.O.:30; I.V.:1168.2; IV Piggyback:700] Out: 1040 [Urine:990; Blood:50]  Physical Exam: Pt is alert and oriented, pleasant elderly gentleman in NAD HEENT: normal Neck: JVP - normal Lungs: CTA bilaterally CV: RRR with grade 2/6 systolic ejection murmur at the left sternal border, no diastolic murmur. Abd: soft, NT, Positive BS, no hepatomegaly Ext: no C/C/E, distal pulses intact and equal, bilateral groin sites clear Skin: warm/dry no rash   Lab Results:  Recent Labs  02/03/14 1450 02/03/14 1452 02/04/14 0358  WBC 5.0  --  5.2  HGB 10.2* 9.9* 9.4*  PLT 102*  --  85*    Recent Labs  02/03/14 1346 02/03/14 1452 02/04/14 0358  NA 139 139 136*  K 3.5* 3.4* 4.1  CL 101  --  102  CO2  --   --  28  GLUCOSE 96 106* 106*  BUN 14  --  15  CREATININE 0.60  --  0.76   No results for input(s): TROPONINI in the last 72 hours.  Invalid input(s): CK, MB  Cardiac Studies: 2D Echo: Study Conclusions  - Left ventricle: The cavity size was normal. Wall thickness was normal. Systolic function was normal. The estimated ejection fraction was in the range of 55% to 60%. Wall motion was normal; there were no regional wall motion abnormalities. Features are consistent with a pseudonormal left ventricular filling pattern, with concomitant abnormal  relaxation and increased filling pressure (grade 2 diastolic dysfunction). - Aortic valve: S/P aortic stent valve placement. It appears well seated and the leaflets move normally. There was mild perivalvular regurgitation. - Mitral valve: There was mild regurgitation. - Left atrium: The atrium was moderately dilated. - Pulmonary arteries: Systolic pressure was mildly increased. PA peak pressure: 43 mm Hg (S).  Tele: Personally reviewed, normal sinus rhythm.  Assessment/Plan:  1. Severe aortic stenosis s/p TAVR POD#1 2. Acute on chronic diastolic CHF secondary to #1 - better with treatment of AS and diuresis 3. Expected post-op anemia and thrombocytopenia, mild 4. Essential HTN - suboptimal control. Reassess in am. Increase amlodipine to 10 mg if remains elevated.   Overall he is progressing well. Await PT eval. Anticipate discharge about 48 hours.  Sherren Mocha, M.D. 02/04/2014, 3:59 PM

## 2014-02-04 NOTE — Progress Notes (Signed)
Utilization review completed. Cintia Gleed, RN, BSN. 

## 2014-02-04 NOTE — Progress Notes (Signed)
Patient transferred to 2W23 via wheelchair on cardiac monitor, no complications. Report given to Dwaine Deter, RN.

## 2014-02-04 NOTE — Progress Notes (Signed)
  Echocardiogram 2D Echocardiogram has been performed.  Douglas Parker 02/04/2014, 2:29 PM

## 2014-02-04 NOTE — Progress Notes (Signed)
Patient urinating frank colored urine. Patient anxious and impulsive at this time. MD notified. Ordered Urinalysis and CBC. Will follow through with orders. Will continue to monitor.  Domingo Dimes, RN, BSN

## 2014-02-04 NOTE — Plan of Care (Signed)
Problem: Phase II - Intermediate Post-Op Goal: Advance Diet Outcome: Completed/Met Date Met:  02/04/14 Goal: Activity Progressed Outcome: Progressing

## 2014-02-04 NOTE — Progress Notes (Addendum)
      [address]       [place] [address]             [phone]        CARDIOTHORACIC SURGERY PROGRESS NOTE   R1 Day Post-Op Procedure(s) (LRB): TRANSCATHETER AORTIC VALVE REPLACEMENT, TRANSFEMORAL (N/A) INTRAOPERATIVE TRANSESOPHAGEAL ECHOCARDIOGRAM (N/A)  Subjective: Surly but looks very good.  Denies pain, SOB.  Wants his dentures so he can eat.  Took 2 people to help him out of bed  Objective: Vital signs: BP Readings from Last 1 Encounters:  02/04/14 148/65   Pulse Readings from Last 1 Encounters:  02/04/14 74   Resp Readings from Last 1 Encounters:  02/04/14 17   Temp Readings from Last 1 Encounters:  02/04/14 98.5 F (36.9 C) Oral    Hemodynamics: PAP: (27-49)/(8-18) 29/8 mmHg CO:  [5.7 L/min-8.3 L/min] 5.8 L/min CI:  [3.2 L/min/m2-4.7 L/min/m2] 3.2 L/min/m2  Physical Exam:  Rhythm:   sinus  Breath sounds: clear  Heart sounds:  RRR w/ soft systolic murmur  Incisions:  Clean and dry  Abdomen:  Soft, non-distended, non-tender  Extremities:  Warm, well-perfused    Intake/Output from previous day: 11/17 0701 - 11/18 0700 In: 1898.2 [P.O.:30; I.V.:1168.2; IV Piggyback:700] Out: 1005 [Urine:955; Blood:50] Intake/Output this shift:    Lab Results:  CBC: Recent Labs  02/03/14 1450 02/03/14 1452 02/04/14 0358  WBC 5.0  --  5.2  HGB 10.2* 9.9* 9.4*  HCT 30.0* 29.0* 28.0*  PLT 102*  --  85*    BMET:  Recent Labs  02/03/14 1346 02/03/14 1452 02/04/14 0358  NA 139 139 136*  K 3.5* 3.4* 4.1  CL 101  --  102  CO2  --   --  28  GLUCOSE 96 106* 106*  BUN 14  --  15  CREATININE 0.60  --  0.76  CALCIUM  --   --  8.4     CBG (last 3)   Recent Labs  02/03/14 1948 02/03/14 2347 02/04/14 0400  GLUCAP 98 113* 113*    ABG    Component Value Date/Time   PHART 7.386 02/03/2014 1455   PCO2ART 50.7* 02/03/2014 1455   PO2ART 159.0* 02/03/2014 1455   HCO3 30.7* 02/03/2014 1455   TCO2 32 02/03/2014 1455   O2SAT 99.0  02/03/2014 1455    CXR: PORTABLE CHEST - 1 VIEW  COMPARISON: 02/03/2014 and earlier.  FINDINGS: Portable AP upright view at 0541 hr. Swan-Ganz catheter removed, right IJ introducer sheath remains. Interval decreased pulmonary vascularity. Stable cardiac size and mediastinal contours. New veiling opacity at the right lung base. No pneumothorax. Superimposed calcified pleural plaques bilaterally.  IMPRESSION: 1. Swan-Ganz catheter removed, right IJ introducer sheath remains. 2. Interval resolved pulmonary vascular congestion. Suggestion of small right pleural effusion. 3. Superimposed calcified pleural plaques.   Electronically Signed  By: Lars Pinks M.D.  On: 02/04/2014 06:48   Assessment/Plan: S/P Procedure(s) (LRB): TRANSCATHETER AORTIC VALVE REPLACEMENT, TRANSFEMORAL (N/A) INTRAOPERATIVE TRANSESOPHAGEAL ECHOCARDIOGRAM (N/A)  Doing well POD1 Expected post op acute blood loss anemia, mild Expected post op atelectasis, mild Expected post op volume excess, mild Post op thrombocytopenia, mild HTN Chronic diastolic CHF Limited mobility   Mobilize  Diuresis  Increase metoprolol to pre-op dose  Watch platelet count but start DAPT per routine  PT consult  Routine ECHO  Transfer step down   Douglas Parker 02/04/2014 8:10 AM

## 2014-02-04 NOTE — Plan of Care (Signed)
Problem: Phase II - Intermediate Post-Op Goal: Maintain Hemodynamic Stability Outcome: Completed/Met Date Met:  02/04/14 Goal: CBGs/Blood Glucose per SCIP Criteria Outcome: Completed/Met Date Met:  02/04/14 Goal: Activity Progressed Outcome: Progressing

## 2014-02-05 LAB — CBC
HCT: 31.5 % — ABNORMAL LOW (ref 39.0–52.0)
HCT: 31.6 % — ABNORMAL LOW (ref 39.0–52.0)
HEMOGLOBIN: 10.8 g/dL — AB (ref 13.0–17.0)
HEMOGLOBIN: 10.7 g/dL — AB (ref 13.0–17.0)
MCH: 31 pg (ref 26.0–34.0)
MCH: 30.9 pg (ref 26.0–34.0)
MCHC: 34 g/dL (ref 30.0–36.0)
MCHC: 34.2 g/dL (ref 30.0–36.0)
MCV: 90.3 fL (ref 78.0–100.0)
MCV: 91.3 fL (ref 78.0–100.0)
PLATELETS: 85 10*3/uL — AB (ref 150–400)
Platelets: 80 10*3/uL — ABNORMAL LOW (ref 150–400)
RBC: 3.45 MIL/uL — ABNORMAL LOW (ref 4.22–5.81)
RBC: 3.5 MIL/uL — AB (ref 4.22–5.81)
RDW: 13.4 % (ref 11.5–15.5)
RDW: 13.3 % (ref 11.5–15.5)
WBC: 5.8 10*3/uL (ref 4.0–10.5)
WBC: 5.5 10*3/uL (ref 4.0–10.5)

## 2014-02-05 LAB — TYPE AND SCREEN
ABO/RH(D): A POS
ANTIBODY SCREEN: NEGATIVE
UNIT DIVISION: 0
Unit division: 0

## 2014-02-05 LAB — URINALYSIS, ROUTINE W REFLEX MICROSCOPIC
Bilirubin Urine: NEGATIVE
GLUCOSE, UA: NEGATIVE mg/dL
Ketones, ur: NEGATIVE mg/dL
LEUKOCYTES UA: NEGATIVE
Nitrite: NEGATIVE
PH: 5 (ref 5.0–8.0)
Protein, ur: NEGATIVE mg/dL
SPECIFIC GRAVITY, URINE: 1.009 (ref 1.005–1.030)
Urobilinogen, UA: 0.2 mg/dL (ref 0.0–1.0)

## 2014-02-05 LAB — BASIC METABOLIC PANEL
Anion gap: 10 (ref 5–15)
BUN: 16 mg/dL (ref 6–23)
CO2: 29 meq/L (ref 19–32)
Calcium: 8.5 mg/dL (ref 8.4–10.5)
Chloride: 100 mEq/L (ref 96–112)
Creatinine, Ser: 0.8 mg/dL (ref 0.50–1.35)
GFR calc Af Amer: >90 mL/min (ref >90)
GFR calc non Af Amer: 80 mL/min — ABNORMAL LOW (ref >90)
GLUCOSE: 95 mg/dL (ref 70–99)
Potassium: 4.1 mEq/L (ref 3.7–5.3)
SODIUM: 139 meq/L (ref 137–147)

## 2014-02-05 LAB — URINE MICROSCOPIC-ADD ON

## 2014-02-05 LAB — GLUCOSE, CAPILLARY
GLUCOSE-CAPILLARY: 92 mg/dL (ref 70–99)
GLUCOSE-CAPILLARY: 135 mg/dL — AB (ref 70–99)
Glucose-Capillary: 100 mg/dL — ABNORMAL HIGH (ref 70–99)
Glucose-Capillary: 99 mg/dL (ref 70–99)
Glucose-Capillary: 99 mg/dL (ref 70–99)

## 2014-02-05 MED ORDER — AMLODIPINE BESYLATE 10 MG PO TABS
10.0000 mg | ORAL_TABLET | Freq: Every day | ORAL | Status: DC
  Administered 2014-02-05 – 2014-02-06 (×2): 10 mg via ORAL
  Filled 2014-02-05 (×2): qty 1

## 2014-02-05 NOTE — Progress Notes (Addendum)
[address]       [place] [address]             [phone]      2 Days Post-Op Procedure(s) (LRB): TRANSCATHETER AORTIC VALVE REPLACEMENT, TRANSFEMORAL (N/A) INTRAOPERATIVE TRANSESOPHAGEAL ECHOCARDIOGRAM (N/A) Subjective: Feels well   Objective: Vital signs in last 24 hours: Temp:  [98.2 F (36.8 C)-98.4 F (36.9 C)] 98.3 F (36.8 C) (11/19 0411) Pulse Rate:  [66-77] 72 (11/19 0411) Cardiac Rhythm:  [-] Normal sinus rhythm (11/18 2038) Resp:  [11-18] 18 (11/19 0411) BP: (136-183)/(45-80) 142/64 mmHg (11/19 0411) SpO2:  [96 %-100 %] 97 % (11/19 0411) Arterial Line BP: (161)/(45) 161/45 mmHg (11/18 1000) Weight:  [139 lb 8.8 oz (63.3 kg)] 139 lb 8.8 oz (63.3 kg) (11/19 0200)  Hemodynamic parameters for last 24 hours:    Intake/Output from previous day: 11/18 0701 - 11/19 0700 In: 3 [I.V.:3] Out: 2285 [Urine:2285] Intake/Output this shift:    General appearance: alert, cooperative and no distress Heart: regular rate and rhythm and no murmur Lungs: clear to auscultation bilaterally Abdomen: benign Extremities: no edema Wound: incis healing well  Lab Results:  Recent Labs  02/05/14 0025 02/05/14 0700  WBC 5.5 5.8  HGB 10.7* 10.8*  HCT 31.5* 31.6*  PLT 80* 85*   BMET:  Recent Labs  02/04/14 0358 02/05/14 0700  NA 136* 139  K 4.1 4.1  CL 102 100  CO2 28 29  GLUCOSE 106* 95  BUN 15 16  CREATININE 0.76 0.80  CALCIUM 8.4 8.5    PT/INR:  Recent Labs  02/03/14 1450  LABPROT 16.8*  INR 1.34   ABG    Component Value Date/Time   PHART 7.386 02/03/2014 1455   HCO3 30.7* 02/03/2014 1455   TCO2 32 02/03/2014 1455   O2SAT 99.0 02/03/2014 1455   CBG (last 3)   Recent Labs  02/04/14 2014 02/05/14 0019 02/05/14 0413  GLUCAP 141* 99 92    Meds Scheduled Meds: . acetaminophen  1,000 mg Oral 4 times per day  . amLODipine  5 mg Oral Daily  . aspirin EC  81 mg Oral Daily  . atorvastatin  20 mg Oral QHS  . cefUROXime  (ZINACEF)  IV  1.5 g Intravenous Q12H  . clopidogrel  75 mg Oral Q breakfast  . finasteride  5 mg Oral Daily  . furosemide  20 mg Oral Daily  . insulin aspart  0-24 Units Subcutaneous 6 times per day  . latanoprost  1 drop Both Eyes QHS  . metoprolol tartrate  25 mg Oral BID  . pantoprazole  40 mg Oral Daily  . pneumococcal 23 valent vaccine  0.5 mL Intramuscular Tomorrow-1000  . potassium chloride  20 mEq Oral Daily  . sodium chloride  3 mL Intravenous Q12H  . sodium chloride  3 mL Intravenous Q12H   Continuous Infusions:  PRN Meds:.sodium chloride, sodium chloride, labetalol, metoprolol, ondansetron (ZOFRAN) IV, oxyCODONE, sodium chloride, sodium chloride, traMADol  Xrays Dg Chest Port 1 View  02/04/2014   CLINICAL DATA:  80 year old male with aortic stenosis status post endovascular valve replacement. Initial encounter.  EXAM: PORTABLE CHEST - 1 VIEW  COMPARISON:  02/03/2014 and earlier.  FINDINGS: Portable AP upright view at 0541 hr. Swan-Ganz catheter removed, right IJ introducer sheath remains. Interval decreased pulmonary vascularity. Stable cardiac size and mediastinal contours. New veiling opacity at the right lung base. No pneumothorax. Superimposed calcified pleural plaques bilaterally.  IMPRESSION: 1. Swan-Ganz catheter removed,  right IJ introducer sheath remains. 2. Interval resolved pulmonary vascular congestion. Suggestion of small right pleural effusion. 3. Superimposed calcified pleural plaques.   Electronically Signed   By: Douglas Parker M.D.   On: 02/04/2014 06:48   Dg Chest Port 1 View  02/03/2014   CLINICAL DATA:  80 year old male status post TAVR. Initial encounter.  EXAM: PORTABLE CHEST - 1 VIEW  COMPARISON:  01/30/2014 and earlier.  FINDINGS: Portable AP semi upright view at 1445 hrs. Right IJ approach Swan-Ganz catheter in place, tip at the level of the main pulmonary outflow tract. Prosthetic cardiac valve. Stable cardiomegaly and mediastinal contours. Increased pulmonary  vascularity, superimposed on calcified pleural plaques. No pneumothorax. No pleural effusion identified.  IMPRESSION: 1. Right IJ approach Swan-Ganz catheter in place, tip at the level of the main pulmonary outflow tract. 2. Increased vascular congestion, no overt edema. 3. Superimposed bilateral calcified pleural plaques.   Electronically Signed   By: Douglas Parker M.D.   On: 02/03/2014 15:19    Assessment/Plan: S/P Procedure(s) (LRB): TRANSCATHETER AORTIC VALVE REPLACEMENT, TRANSFEMORAL (N/A) INTRAOPERATIVE TRANSESOPHAGEAL ECHOCARDIOGRAM (N/A)  1 excellent progress 2 hemodyn stable in sinus, but HTN not well controlled- will increase norvasc to 10 3 labs stable, thrombocytopenia trend improved 4 cont to push rehab     LOS: 2 days    Douglas Parker 02/05/2014  I have seen and examined the patient and agree with the assessment and plan as outlined.  Douglas Parker 02/05/2014 8:25 am

## 2014-02-05 NOTE — Clinical Social Work Psychosocial (Signed)
Clinical Social Work Department BRIEF PSYCHOSOCIAL ASSESSMENT 02/05/2014  Patient:  Douglas Parker, Douglas Parker     Account Number:  [id number]     Admit date:  02/03/2014  Clinical Social Worker:  Dian Queen  Date/Time:  02/05/2014 04:45 PM  Referred by:  Physician  Date Referred:  02/05/2014 Referred for  SNF Placement   Other Referral:   Interview type:  Patient Other interview type:    PSYCHOSOCIAL DATA Living Status:  ALONE Admitted from facility:   Level of care:   Primary support name:   Primary support relationship to patient:  SPOUSE Degree of support available:   Patient has a wife, but can not take care of patient. Patient has a brother who is on contact list.    CURRENT CONCERNS Current Concerns  Post-Acute Placement   Other Concerns:    SOCIAL WORK ASSESSMENT / PLAN Patient is an 80 year old male who lives with his wife but she is not able to help take care of him.  Patient was alert and oriented x3 and friendly.  Patient states he would like to go to SNF for rehab so he can gain his strength back and then return back home.  Patient states he would like [facility] if he can get there, but is open to other facilities in [place].  Patient is in agreement to SNF placement for short term rehab.  Patient will be discharged to SNF once orders are received and patient is medically ready.   Assessment/plan status:   Other assessment/ plan:   Information/referral to community resources:    PATIENT'S/FAMILY'S RESPONSE TO PLAN OF CARE: Patient in agreement to plan of care.    Jones Broom. [place], MSW, [place] 02/05/2014 4:51 PM

## 2014-02-05 NOTE — Progress Notes (Signed)
CARDIAC REHAB PHASE I   PRE:  Rate/Rhythm: 69 SR  BP:  Supine:   Sitting: 120/50  Standing:    SaO2: 97%RA  MODE:  Ambulation: 300 ft   POST:  Rate/Rhythm: 70 SR  BP:  Supine:   Sitting: 113/52  Standing:     SaO2: 98%RA 1400-1440 Pt walked 300 ft after going to bathroom with rolling walker and asst x 1. Gait fairly steady. To recliner and chair alarm after walk. Tolerated well. Back limits mobility per pt.   Graylon Good, RN BSN  02/05/2014 2:37 PM

## 2014-02-05 NOTE — Progress Notes (Signed)
    Subjective:  No chest pain or dyspnea  Objective:  Vital Signs in the last 24 hours: Temp:  [98.3 F (36.8 C)-98.5 F (36.9 C)] 98.5 F (36.9 C) (11/19 1410) Pulse Rate:  [72-74] 74 (11/19 1410) Resp:  [18] 18 (11/19 1410) BP: (138-173)/(55-66) 138/66 mmHg (11/19 1410) SpO2:  [96 %-100 %] 97 % (11/19 1410) Weight:  [139 lb 8.8 oz (63.3 kg)] 139 lb 8.8 oz (63.3 kg) (11/19 0200)  Intake/Output from previous day: 11/18 0701 - 11/19 0700 In: 3 [I.V.:3] Out: 2285 [Urine:2285]  Physical Exam: Pt is alert and oriented, NAD HEENT: normal Neck: JVP - normal Lungs: CTA bilaterally CV: RRR with grade 2/6 systolic ejection murmur at the left sternal border Abd: soft, NT, Positive BS, no hepatomegaly Ext: no C/C/E, distal pulses intact and equal Skin: warm/dry no rash   Lab Results:  Recent Labs  02/05/14 0025 02/05/14 0700  WBC 5.5 5.8  HGB 10.7* 10.8*  PLT 80* 85*    Recent Labs  02/04/14 0358 02/05/14 0700  NA 136* 139  K 4.1 4.1  CL 102 100  CO2 28 29  GLUCOSE 106* 95  BUN 15 16  CREATININE 0.76 0.80   No results for input(s): TROPONINI in the last 72 hours.  Invalid input(s): CK, MB  Tele: Personally reviewed, normal sinus rhythm.  Assessment/Plan:  1. Severe aortic stenosis s/p TAVR POD#2 2. Acute on chronic diastolic CHF secondary to #1 - better with treatment of AS and diuresis 3. Expected post-op anemia and thrombocytopenia, mild 4. Essential HTN - blood pressure improved on amlodipine 10 mg  Overall the patient is doing well and making good progress. Primary issue now relates to his physical limitation from severe kyphoscoliosis. He would like to go to short-term skilled nursing for rehabilitation. He has now been seen by physical therapy and will request a Education officer, museum consult for placement. Medically he is stable and hopefully he can be discharged tomorrow. Will arrange all of his post hospital follow-up which will include a PA/NP visit in 1-2  weeks and a 30 day visit with an echocardiogram.   Sherren Mocha, M.D. 02/05/2014, 3:28 PM

## 2014-02-05 NOTE — Evaluation (Signed)
Physical Therapy Evaluation Patient Details Name: Douglas Dunshee Wigington Sr. MRN: [id number] DOB: 1927/03/29 Today's Date: 02/05/2014   History of Present Illness  80 y.o. male s/p TRANSCATHETER AORTIC VALVE REPLACEMENT, TRANSFEMORAL   Clinical Impression  Patient is seen following the above procedure and presents with functional limitations due to the deficits listed below (see PT Problem List). Pt required min assist due to loss of balance, improves with use of a rolling walker for stability. Pt concerned if wife will be able to provide adequate care at home. Will continue to work with pt acutely and progress his functional independence. Patient will benefit from skilled PT to increase their independence and safety with mobility to allow discharge to the venue listed below.      Follow Up Recommendations SNF;Supervision for mobility/OOB    Equipment Recommendations  3in1 (PT)    Recommendations for Other Services       Precautions / Restrictions Precautions Precautions: Fall Restrictions Weight Bearing Restrictions: No      Mobility  Bed Mobility                  Transfers Overall transfer level: Needs assistance Equipment used: Rolling walker (2 wheeled) Transfers: Sit to/from Stand Sit to Stand: Min guard         General transfer comment: Close guard for safety. Mild instability correct with rolling walker for support. VC for hand placement, good control with descent. Performed from recliner.  Ambulation/Gait Ambulation/Gait assistance: Min assist Ambulation Distance (Feet): 200 Feet (additonal bout of 90) Assistive device: Rolling walker (2 wheeled) Gait Pattern/deviations: Step-through pattern;Decreased stride length;Decreased step length - right;Decreased step length - left;Staggering right;Leaning posteriorly;Narrow base of support;Trunk flexed   Gait velocity interpretation: Below normal speed for age/gender General Gait Details: Min assist for stability  due to 2 instances of loss of balance to right and posterior. Occured once with and once without rolling walker. Vitals remained stable throughout bout. Cues to keep weight forward on RW to prevent lifting and LOB.  Stairs            Wheelchair Mobility    Modified Rankin (Stroke Patients Only)       Balance Overall balance assessment: Needs assistance Sitting-balance support: No upper extremity supported;Feet supported Sitting balance-Leahy Scale: Good     Standing balance support: No upper extremity supported Standing balance-Leahy Scale: Fair                               Pertinent Vitals/Pain Pain Assessment: 0-10 Pain Score: 2  Pain Location: left groin  Pain Descriptors / Indicators: Burning Pain Intervention(s): Monitored during session;Repositioned    Home Living Family/patient expects to be discharged to:: Skilled nursing facility Living Arrangements: Spouse/significant other Available Help at Discharge: Family;Available 24 hours/day Type of Home: House Home Access: Stairs to enter Entrance Stairs-Rails: None Entrance Stairs-Number of Steps: 4 Home Layout: One level Home Equipment: Cane - single point;Walker - 2 wheels      Prior Function Level of Independence: Independent               Hand Dominance   Dominant Hand: Right    Extremity/Trunk Assessment   Upper Extremity Assessment: Defer to OT evaluation           Lower Extremity Assessment: Generalized weakness         Communication   Communication: No difficulties  Cognition Arousal/Alertness: Awake/alert Behavior During Therapy: WFL for tasks  assessed/performed Overall Cognitive Status: No family/caregiver present to determine baseline cognitive functioning (disoriented to month (recalls person, place, situation.))                      General Comments      Exercises General Exercises - Lower Extremity Ankle Circles/Pumps: AROM;Both;10  reps;Seated Quad Sets: Strengthening;Both;10 reps;Seated Gluteal Sets: Strengthening;Both;10 reps;Seated Long Arc Quad: Strengthening;Both;10 reps;Seated      Assessment/Plan    PT Assessment Patient needs continued PT services  PT Diagnosis Difficulty walking;Abnormality of gait;Generalized weakness;Acute pain   PT Problem List Decreased strength;Decreased activity tolerance;Decreased balance;Decreased mobility;Decreased knowledge of use of DME;Decreased cognition;Decreased knowledge of precautions;Cardiopulmonary status limiting activity;Pain  PT Treatment Interventions DME instruction;Gait training;Functional mobility training;Therapeutic activities;Balance training;Therapeutic exercise;Neuromuscular re-education;Patient/family education;Modalities   PT Goals (Current goals can be found in the Care Plan section) Acute Rehab PT Goals Patient Stated Goal: Go to rehab PT Goal Formulation: With patient Time For Goal Achievement: 02/19/14 Potential to Achieve Goals: Good    Frequency Min 2X/week   Barriers to discharge Decreased caregiver support Pt questions whether wife can safely care for him    Co-evaluation               End of Session Equipment Utilized During Treatment: Gait belt Activity Tolerance: Patient tolerated treatment well Patient left: in chair;with chair alarm set;with call bell/phone within reach Nurse Communication: Mobility status         Time: 9381-8299 PT Time Calculation (min) (ACUTE ONLY): 32 min   Charges:   PT Evaluation $Initial PT Evaluation Tier I: 1 Procedure PT Treatments $Gait Training: 8-22 mins   PT G CodesEllouise Newer 02/05/2014, 12:31 PM  Camille Bal [place], [place]

## 2014-02-05 NOTE — Plan of Care (Signed)
Problem: Phase II - Intermediate Post-Op Goal: Activity Progressed Outcome: Completed/Met Date Met:  02/05/14

## 2014-02-05 NOTE — Clinical Social Work Placement (Addendum)
Clinical Social Work Department CLINICAL SOCIAL WORK PLACEMENT NOTE 02/05/2014  Patient:  Douglas Parker, Douglas Parker  Account Number:  [id number] Admit date:  02/03/2014  Clinical Social Worker:  Rotunda Worden, LCSWA  Date/time:  02/05/2014 04:52 PM  Clinical Social Work is seeking post-discharge placement for this patient at the following level of care:   SKILLED NURSING   (*CSW will update this form in Epic as items are completed)   02/05/2014  Patient/family provided with [place] Department of Clinical Social Work's list of facilities offering this level of care within the geographic area requested by the patient (or if unable, by the patient's family).  02/05/2014  Patient/family informed of their freedom to choose among providers that offer the needed level of care, that participate in Medicare, Medicaid or managed care program needed by the patient, have an available bed and are willing to accept the patient.  02/05/2014  Patient/family informed of [facility]' ownership interest in [facility], as well as of the fact that they are under no obligation to receive care at this facility.  PASARR submitted to EDS on 02/05/2014 PASARR number received on 02/05/2014  FL2 transmitted to all facilities in geographic area requested by pt/family on  02/05/2014 FL2 transmitted to all facilities within larger geographic area on 02/05/2014  Patient informed that his/her managed care company has contracts with or will negotiate with  certain facilities, including the following:     Patient/family informed of bed offers received: 02-06-14   Patient chooses bed at [facility]  Physician recommends and patient chooses bed at    Patient to be transferred to [facility] on 02/06/14   Patient to be transferred to facility by [facility]  Patient and family notified of transfer on 02/06/14 Name of family member notified: Patient notified his brother and friend did not want CSW to contact  family for him.   The following physician request were entered in Epic:   Additional Comments:  Maryam Feely R. [place], MSW, [place] 02/06/2014 5:00 PM

## 2014-02-05 NOTE — Plan of Care (Signed)
Problem: Phase III - Recovery through Discharge Goal: Maintain Hemodynamic Stability Outcome: Completed/Met Date Met:  02/05/14 Goal: Activity Progressed Outcome: Completed/Met Date Met:  02/05/14

## 2014-02-05 NOTE — Progress Notes (Signed)
Pt ambulated approx 244ft with rolling walker. No  Complications. Monitoring will continue.

## 2014-02-06 ENCOUNTER — Other Ambulatory Visit: Payer: Self-pay | Admitting: Physician Assistant

## 2014-02-06 DIAGNOSIS — Z952 Presence of prosthetic heart valve: Secondary | ICD-10-CM

## 2014-02-06 LAB — GLUCOSE, CAPILLARY
GLUCOSE-CAPILLARY: 81 mg/dL (ref 70–99)
Glucose-Capillary: 89 mg/dL (ref 70–99)
Glucose-Capillary: 83 mg/dL (ref 70–99)

## 2014-02-06 MED ORDER — POTASSIUM CHLORIDE CRYS ER 20 MEQ PO TBCR: 20.0000 meq | EXTENDED_RELEASE_TABLET | Freq: Every day | ORAL | Status: DC

## 2014-02-06 MED ORDER — AMLODIPINE BESYLATE 10 MG PO TABS: 10.0000 mg | ORAL_TABLET | Freq: Every day | ORAL | Status: DC

## 2014-02-06 MED ORDER — CLOPIDOGREL BISULFATE 75 MG PO TABS: 75.0000 mg | ORAL_TABLET | Freq: Every day | ORAL | Status: DC

## 2014-02-06 NOTE — Progress Notes (Addendum)
       [address]       [place] [address]             [phone]          3 Days Post-Op Procedure(s) (LRB): TRANSCATHETER AORTIC VALVE REPLACEMENT, TRANSFEMORAL (N/A) INTRAOPERATIVE TRANSESOPHAGEAL ECHOCARDIOGRAM (N/A)  Subjective: Feels well, no complaints.     Objective: Vital signs in last 24 hours: Patient Vitals for the past 24 hrs:  BP Temp Temp src Pulse Resp SpO2 Weight  02/06/14 0323 (!) 159/67 mmHg 97.6 F (36.4 C) Oral 78 16 98 % 135 lb 4.8 oz (61.372 kg)  02/05/14 1957 125/74 mmHg 98.2 F (36.8 C) Oral 69 16 97 % -  02/05/14 1410 138/66 mmHg 98.5 F (36.9 C) Oral 74 18 97 % -   Current Weight  02/06/14 135 lb 4.8 oz (61.372 kg)     Intake/Output from previous day: 11/19 0701 - 11/20 0700 In: 360 [P.O.:360] Out: 400 [Urine:400]    PHYSICAL EXAM:  Heart: RRR Lungs: Clear Wound: Groin site stable, no hematoma Extremities: No edema    Lab Results: CBC: Recent Labs  02/05/14 0025 02/05/14 0700  WBC 5.5 5.8  HGB 10.7* 10.8*  HCT 31.5* 31.6*  PLT 80* 85*   BMET:  Recent Labs  02/04/14 0358 02/05/14 0700  NA 136* 139  K 4.1 4.1  CL 102 100  CO2 28 29  GLUCOSE 106* 95  BUN 15 16  CREATININE 0.76 0.80  CALCIUM 8.4 8.5    PT/INR:  Recent Labs  02/03/14 1450  LABPROT 16.8*  INR 1.34      Assessment/Plan: S/P Procedure(s) (LRB): TRANSCATHETER AORTIC VALVE REPLACEMENT, TRANSFEMORAL (N/A) INTRAOPERATIVE TRANSESOPHAGEAL ECHOCARDIOGRAM (N/A) CV- stable, BPs improved.   Deconditioning- Continue PT/ambulation. Disp- Hopefully to SNF today if a bed is available.  Pt states that if there is not a bed today, he could go home with his brother.   LOS: 3 days    COLLINS,GINA H 02/06/2014  I have seen and examined the patient and agree with the assessment and plan as outlined.  OWEN,CLARENCE H 02/06/2014 1:25 PM

## 2014-02-06 NOTE — Care Management Note (Signed)
    Page 1 of 1   02/06/2014     2:40:54 PM CARE MANAGEMENT NOTE 02/06/2014  Patient:  JENNER, ROSIER   Account Number:  [id number]  Date Initiated:  02/04/2014  Documentation initiated by:  Luz Lex  Subjective/Objective Assessment:   Admitted post AVR.     Action/Plan:   Anticipated DC Date:  02/06/2014   Anticipated DC Plan:  SKILLED NURSING FACILITY  In-house referral  Clinical Social Worker      DC Planning Services  CM consult      Choice offered to / List presented to:             Status of service:  Completed, signed off Medicare Important Message given?  YES (If response is "NO", the following Medicare IM given date fields will be blank) Date Medicare IM given:  02/06/2014 Medicare IM given by:  Marvetta Gibbons Date Additional Medicare IM given:   Additional Medicare IM given by:    Discharge Disposition:  [place]  Per UR Regulation:  Reviewed for med. necessity/level of care/duration of stay  If discussed at [place] of Stay Meetings, dates discussed:    Comments:  Contact:  [place]rone,E.R. Brother [phone]                  Zara Chess -  [phone]  02/06/14- 1400- Marvetta Gibbons RN, BSN [phone] Pt for SNF today, CSW following for d/c needs  02-04-14  2:10pm Luz Lex, RNBSN [phone] PT ordered - plan for SNF post op prior to prior level.  SW referral made.  Per nurse lives at home alone.  Echo being done at this time.  CM will continue to follow.

## 2014-02-06 NOTE — Progress Notes (Signed)
[id number] Cardiac Rehab Completed discharge education with pt. He voices understanding. Pt agrees to [place]. [facility] in [place], will send referral. Deon Pilling, RN 02/06/2014 11:42 AM

## 2014-02-06 NOTE — Clinical Social Work Note (Signed)
Patient to be d/c'ed today to [facility].  Patient and family agreeable to plans will transport via ems RN to call report. Patient stated that he notified his friend and brother.  Patient stated he was glad to be leaving to get his rehab and return home.  Evette Cristal, MSW, [place]

## 2014-02-06 NOTE — Discharge Summary (Signed)
Physician Discharge Summary     Cardiologist:  Burt Knack Patient ID: Doreene Nest Umbarger Sr. MRN: [id number] DOB/AGE: June 22, 1927 80 y.o.  Admit date: 02/03/2014 Discharge date: 02/06/2014  Admission Diagnoses:  S/P TAVR (transcatheter aortic valve replacement)  Discharge Diagnoses:  Principal Problem:   S/P TAVR (transcatheter aortic valve replacement) Active Problems:   Essential hypertension   Chronic diastolic congestive heart failure   Aortic stenosis, severe   Post op anemia   Discharged Condition: stable  Hospital Course:  80 year old gentleman referred for evaluation of aortic stenosis. The patient has been followed by Dr. Rockey Situ. He's been physically limited by kyphoscoliosis. The patient has been followed for aortic stenosis now for the past several years. However, a recent echocardiogram showed progression of his aortic stenosis, now in the severe range, with peak and mean gradients of 78 and 47 mm mercury, respectively. He also underwent recent coronary angiography demonstrating mild to moderate proximal LAD stenosis, and no significant disease of the left circumflex and RCA.  The patient describes symptoms of exercise intolerance over the past 6 months to one year. He has difficulty explaining his limitation, but describes weakness and nausea with exertion. He does not feel short of breath and he specifically denies chest pain or pressure. He is not able to do as much physical activity as he has been capable of in the past. He describes increased fatigue and rare occasions of lightheadedness. He has not had frank syncope.  The patient has also had more trouble related to his scoliosis over the past year. He is now on oxycodone and this has helped improve his back pain. He complains of significant weight loss. In the last 1-2 years he has lost approximately 50 pounds which is greater than 25% of his body weight. He's only been eating 1-1-1/2 meals per day. He denies anorexia,  abdominal pain, night sweats, fevers, or chills. He attributes some of his weight loss to social factors. He has been caring for about 60 cats, and this takes up much of his time.  The patient is married. He lives in [place]. He is a retired Development worker, community. He quit smoking 50 years ago. He does not drink alcohol. He is here alone today.  He presented for TAVR which was completed successfully.  Post op echo revealed the prosthetic valve was operating normally with mild perivalvular regurgitation.  BP was poorly controlled so amlodipine was increased to 10mg .  He was continued on lasix 20mg  daily throughout the admission.  He was follow by PT and cardiac rehab.  Recommendations were for short term SNF. The patient was seen by Dr. Burt Knack who felt he was stable for DC to skilled nursing.  Follow up arranged along with echo in 30 days.      Consults: Cardiac Rehab, PT   Significant Diagnostic Studies: [facility]  TAVR OPERATIVE NOTE   Date of Procedure:02/03/2014 Preoperative Diagnosis:Severe Aortic Stenosis  Postoperative Diagnosis:Same  Procedure:   Transcatheter Aortic Valve Replacement - Transfemoral Approach Edwards Sapien 3 THV (size 26 mm, model # 9300TFX, serial # [id number])  Co-Surgeons:Clarence H. Roxy Manns, MD and Sherren Mocha, MD Assistants:Kristalyn Bergstresser Alveria Apley, MD and Lauree Chandler, MD Anesthesiologist:Christopher Ermalene Postin, MD Dala Dock, MD  Pre-operative Echo Findings:  severe aortic stenosis  normal left ventricular systolic function  Mild MR, Mild TR  Post-operative Echo Findings:  trace paravalvular leak  normal left ventricular systolic function  Mild MR/TR  BRIEF CLINICAL NOTE AND INDICATIONS FOR SURGERY  80 year-old male with severe symptomatic  AS presenting for TAVR.   During the course  of the patient's preoperative work up they have been evaluated comprehensively by a multidisciplinary team of specialists coordinated through the [facility] the [facility]. They have been demonstrated to suffer from symptomatic severe aortic stenosis as noted above. The patient has been counseled extensively as to the relative risks and benefits of all options for the treatment of severe aortic stenosis including long term medical therapy, conventional surgery for aortic valve replacement, and transcatheter aortic valve replacement. The patient has been independently evaluated by two cardiac surgeons including Dr Roxy Manns and Dr. Cyndia Bent, and they are felt to be at high risk for conventional surgical aortic valve replacement based upon a predicted risk of mortality using the Society of Thoracic Surgeons risk calculator of 3.4%. Both surgeons indicated the patient's risk of morbidity and mortality would be much higher than that predicted by the STS risk calculator and that the patient would be a poor candidate for conventional surgery (predicted risk of mortality >15% and/or predicted risk of permanent morbidity >50%) because of comorbidities including severe kyphoscoliosis with limited mobility and severe protein calorie malnutrition. Based upon review of all of the patient's preoperative diagnostic tests they are felt to be candidate for transcatheter aortic valve replacement using the transfemoral approach as an alternative to high risk conventional surgery.   Following the decision to proceed with transcatheter aortic valve replacement, a discussion has been held regarding what types of management strategies would be attempted intraoperatively in the event of life-threatening complications, including whether or not the patient would be considered a candidate for the use of cardiopulmonary bypass and/or conversion to open sternotomy for attempted surgical  intervention. The patient has been advised of a variety of complications that might develop peculiar to this approach including but not limited to risks of death, stroke, paravalvular leak, aortic dissection or other major vascular complications, aortic annulus rupture, device embolization, cardiac rupture or perforation, acute myocardial infarction, arrhythmia, heart block or bradycardia requiring permanent pacemaker placement, congestive heart failure, respiratory failure, renal failure, pneumonia, infection, other late complications related to structural valve deterioration or migration, or other complications that might ultimately cause a temporary or permanent loss of functional independence or other long term morbidity. The patient provides full informed consent for the procedure as described and all questions were answered preoperatively.    DETAILS OF THE OPERATIVE PROCEDURE  PREPARATION:   The patient is brought to the operating room on the above mentioned date and central monitoring was established by the anesthesia team including placement of Swan-Ganz catheter and radial arterial line. The patient is placed in the supine position on the operating table. Intravenous antibiotics are administered. General endotracheal anesthesia is induced uneventfully. A Foley catheter is placed.  Baseline transesophageal echocardiogram was performed. The patient's chest, abdomen, both groins, and both lower extremities are prepared and draped in a sterile manner. A time out procedure is performed.   PERIPHERAL ACCESS:   Using the modified Seldinger technique, femoral arterial and venous access was obtained with placement of 6 Fr sheaths on the right side. A pigtail diagnostic catheter was passed through the right femoral arterial sheath under fluoroscopic guidance into the aortic root. A temporary transvenous pacemaker catheter was passed through the right femoral venous sheath under fluoroscopic  guidance into the right ventricle. The pacemaker was tested to ensure stable lead placement and pacemaker capture. Aortic root angiography was performed in order to determine the optimal  angiographic angle for valve deployment.   TRANSFEMORAL ACCESS:   A left femoral arterial cutdown was performed by Dr Roxy Manns. Please see his separate operative note for details. The patient was heparinized systemically and ACT verified > 250 seconds.   A 14 Fr transfemoral E-sheath was introduced into the left femoral artery after progressively dilating over an Amplatz superstiff wire. The aortic valve was difficult to cross. AL-1, AL-2,and MPA catheters were used to direct a straight-tip wire, all unsuccessfully. Finally, a Lyman Speller aortic stenosis was successful in directing a straight wire across the valve into the left ventricle. This was exchanged out for a pigtail catheter and position was confirmed in the LV apex. Simultaneous LV and Ao pressures were recorded. The pigtail catheter was then exchanged for an Amplatz Extra-stiff wire in the LV apex. At that point, BAV was performed using a 23 mm valvuloplasty balloon. Once optimal position was achieved, BAV was done under rapid ventricular pacing at 180 bpm. The patient recovered well hemodynamically.   TRANSCATHETER HEART VALVE DEPLOYMENT:  An Edwards Sapien 3 THV (size 26 mm) was prepared and crimped per manufacturer's guidelines, and the proper orientation of the valve is confirmed on the [employer] delivery system. The valve was advanced through the introducer sheath using normal technique until in an appropriate position in the abdominal aorta beyond the sheath tip. The balloon was then retracted and using the fine-tuning wheel was centered on the valve. The valve was then advanced across the aortic arch using appropriate flexion of the catheter. The valve was carefully positioned across the aortic valve annulus. The Commander catheter was retracted  using normal technique. Once final position of the valve has been confirmed by angiographic assessment, the valve is deployed while temporarily holding ventilation and during rapid ventricular pacing to maintain systolic blood pressure < 50 mmHg and pulse pressure < 10 mmHg. The balloon inflation is held for >3 seconds after reaching full deployment volume. Once the balloon has fully deflated the balloon is retracted into the ascending aorta and valve function is assessed using TEE. There is felt to be trace paravalvular leak and no central aortic insufficiency. The patient's hemodynamic recovery following valve deployment is good. The deployment balloon and guidewire are both removed. Echo demostrated acceptable post-procedural gradients, stable mitral valve function, and trace AI.   PROCEDURE COMPLETION:  The sheath was then removed and arteriotomy repaired by Dr Roxy Manns. Please see his separate report for details. Distal abdominal aortography was performed to evaluate for any arterial injury related to the procedure. There was no evidence dissection, perforation, or other vascular injury in the abdominal aorta, iliac artery, or femoral artery. Protamine was administered once femoral arterial repair was complete. The temporary pacemaker, pigtail catheters and femoral sheaths were removed with manual pressure used for hemostasis.   The patient tolerated the procedure well and is transported to the surgical intensive care in stable condition. There were no immediate intraoperative complications. All sponge instrument and needle counts are verified correct at completion of the operation.   No blood products were administered during the operation.  The patient received a total of 66 mL of intravenous contrast during the procedure.  Sherren Mocha MD 02/03/2014       CARDIOTHORACIC SURGERY OPERATIVE NOTE  Date of Procedure:02/03/2014  Preoperative Diagnosis:Severe Aortic  Stenosis   Postoperative Diagnosis:Same   Procedure:   Transcatheter Aortic Valve Replacement - Open Left Transfemoral Approach Edwards Sapien 3 Transcatheter Heart Valve (size 26 mm, model # 9600TFX, serial # [id number])  Co-Surgeons:Clarence H. Roxy Manns, MD and Sherren Mocha, MD  Assistants:Amberlin Utke Alveria Apley, MD and Lauree Chandler, MD  Anesthesiologist:Chris Ermalene Postin, MD  Dala Dock, MD  Pre-operative Echo Findings:  Severe aortic stenosis  Normal left ventricular systolic function  Post-operative Echo Findings:  Trivial paravalvular leak  Normal left ventricular systolic function      DETAILS OF THE OPERATIVE PROCEDURE  The majority of the procedure is documented separately in a procedure note by Dr. Burt Knack.   TRANSFEMORAL ACCESS:   A small incision is made in the left groin immediately over the common femoral artery. The subcutaneous tissues are divided with electrocautery and the anterior surface of the common femoral artery is identified. Sharp dissection is utilized to free up the artery proximally and distally and the vessel is encircled with a vessel loop. A pair of CV-4 Gore-tex sutures are place as diamond-shaped purse-strings on the anterior surface of the femoral artery. The patient is heparinized systemically and ACT verified > 250 seconds. The common femoral artery is punctured using an 18 gauge needle and a soft J-tipped guidewire is passed into the common iliac artery under fluoroscopic guidance. A 6 Fr straight diagnostic catheter is placed over the guidewire and the guidewire is removed. An Amplatz super stiff guidewire is passed through the sheath into the descending thoracic aorta and the introducing diagnostic catheter is removed. Serial dilators are passed over the guidewire under continuous fluoroscopic  guidance, making certain that each dilator passes easily all of the way into the distal abdominal aorta. A 14 Fr Commander introducer sheath is passed over the guidewire into the abdominal aorta. The introducing dilator is removed, the sheath is flushed with heparinized saline, and the sheath is secured to the skin.    FEMORAL SHEATH REMOVAL AND ARTERIAL CLOSURE:  After the completion of successful valve deployment as documented separately by Dr. Burt Knack, the femoral artery sheath is removed and the arteriotomy is closed using the previously placed Gore-tex purse-string sutures. Once the repair has been completed protamine was administered to reverse the anticoagulation. A digitally-subtracted arteriogram is obtained from just above the aortic bifurcation to below the arteriotomy to confirm the integrity of the vascular repair. The incision is irrigated with saline solution and subsequently closed in multiple layers using absorbable suture. The skin incision is closed using a subcuticular skin closure.     Valentina Gu. Roxy Manns MD 02/03/2014 Treatments: See above  Discharge Exam: Blood pressure 152/95, pulse 76, temperature 97.6 F (36.4 C), temperature source Oral, resp. rate 16, height 5\' 10"  (1.778 m), weight 135 lb 4.8 oz (61.372 kg), SpO2 98 %.   Disposition: Final discharge disposition not confirmed      Discharge Instructions    Amb Referral to Cardiac Rehabilitation    Complete by:  As directed   Pt agrees to [place]. CRP in [place], will send referral.     Diet - low sodium heart healthy    Complete by:  As directed      Increase activity slowly    Complete by:  As directed             Medication List    TAKE these medications        amLODipine 10 MG tablet  Commonly known as:  NORVASC  Take 1 tablet (10 mg total) by mouth daily.     aspirin 81 MG EC tablet  Take 81 mg by mouth daily.     atorvastatin 20 MG tablet  Commonly known as:  LIPITOR  Take 1 tablet (20  mg total) by mouth at bedtime.     clopidogrel 75 MG tablet  Commonly known as:  PLAVIX  Take 1 tablet (75 mg total) by mouth daily with breakfast.     finasteride 5 MG tablet  Commonly known as:  PROSCAR  Take 5 mg by mouth daily.     Fish Oil 1000 MG Caps  Take 1,000 mg by mouth daily.     furosemide 20 MG tablet  Commonly known as:  LASIX  Take 1 tablet (20 mg total) by mouth daily as needed for edema.     metoprolol tartrate 25 MG tablet  Commonly known as:  LOPRESSOR  Take 1 tablet (25 mg total) by mouth 2 (two) times daily.     multivitamin tablet  Take 1 tablet by mouth daily.     multivitamin-lutein Caps capsule  Take 1 capsule by mouth daily.     omeprazole 20 MG capsule  Commonly known as:  PRILOSEC  Take 1 capsule (20 mg total) by mouth daily.     Oxycodone HCl 10 MG Tabs  Take 5-10 mg by mouth 3 (three) times daily as needed (for pain).     potassium chloride SA 20 MEQ tablet  Commonly known as:  K-DUR,KLOR-CON  Take 1 tablet (20 mEq total) by mouth daily.     TRAVATAN Z 0.004 % Soln ophthalmic solution  Generic drug:  Travoprost (BAK Free)  Place 1 drop into the right eye at bedtime.     vitamin C 500 MG tablet  Commonly known as:  ASCORBIC ACID  Take 1,000 mg by mouth daily.     vitamin E 400 UNIT capsule  Take 400 Units by mouth daily.       Follow-up Information    Follow up with Ermalinda Barrios, PA-C On 02/23/2014.   Specialty:  Cardiology   Why:  8:45 AM   Contact information:   [address] [place] [address] [phone]       Follow up with [facility].   Why:  You will be contacted regarding this appt date and time.      Greater than 30 minutes was spent completing the patient's discharge.   SignedTarri Fuller, [place] 02/06/2014, 12:24 PM

## 2014-02-06 NOTE — Progress Notes (Signed)
Report called to Timmothy Sours at [facility], pt dressed and awaiting EMS pick-up, IV and tele removed Rickard Rhymes, RN

## 2014-02-06 NOTE — Progress Notes (Signed)
Medicare Important Message given? YES  (If response is "NO", the following Medicare IM given date fields will be blank)  Date Medicare IM given: 02/06/14 Medicare IM given by:  Tilak Oakley  

## 2014-02-06 NOTE — Progress Notes (Signed)
    Subjective:  Feels well. No chest pain or shortness of breath.   Objective:  Vital Signs in the last 24 hours: Temp:  [97.6 F (36.4 C)-98.5 F (36.9 C)] 97.6 F (36.4 C) (11/20 0323) Pulse Rate:  [69-78] 78 (11/20 0323) Resp:  [16-18] 16 (11/20 0323) BP: (125-159)/(66-74) 159/67 mmHg (11/20 0323) SpO2:  [97 %-98 %] 98 % (11/20 0323) Weight:  [135 lb 4.8 oz (61.372 kg)] 135 lb 4.8 oz (61.372 kg) (11/20 0323)  Intake/Output from previous day: 11/19 0701 - 11/20 0700 In: 360 [P.O.:360] Out: 400 [Urine:400]  Physical Exam: Pt is alert and oriented, NAD HEENT: normal Neck: JVP - normal Lungs: CTA bilaterally CV: RRR with soft systolic ejection murmur at the RUSB Abd: soft, NT, Positive BS, no hepatomegaly Ext: no C/C/E, bilateral groin sites clear Skin: warm/dry no rash   Lab Results:  Recent Labs  02/05/14 0025 02/05/14 0700  WBC 5.5 5.8  HGB 10.7* 10.8*  PLT 80* 85*    Recent Labs  02/04/14 0358 02/05/14 0700  NA 136* 139  K 4.1 4.1  CL 102 100  CO2 28 29  GLUCOSE 106* 95  BUN 15 16  CREATININE 0.76 0.80   No results for input(s): TROPONINI in the last 72 hours.  Invalid input(s): CK, MB  Tele: Personally reviewed. Sinus rhythm without significant arrhythmia.  Assessment/Plan:  1. Severe aortic stenosis s/p TAVR POD#3 2. Acute on chronic diastolic CHF secondary to #1 - better with treatment of AS and diuresis. Appears euvolemic on exam. 3. Expected post-op anemia and thrombocytopenia, mild 4. Essential HTN - controlled on combination of amlodipine and metoprolol 5. Severe kyphoscoliosis with chronic pain and physical limitation  Pt medically stable. Ready for discharge to ST[facility]NF for rehab. Will arrange post-hospital follow-up. Should have PA/NP visit in 1-2 weeks and 30 day f/u echo and [facility] appt. Current meds reviewed and would continue same at discharge (ASA, plavix, metoprolol, amlodipine, lasix). Awaiting bed, hopefully [facility]  per patient request.  Sherren Mocha, M.D. 02/06/2014, 7:51 AM

## 2014-02-09 ENCOUNTER — Other Ambulatory Visit: Payer: Self-pay | Admitting: *Deleted

## 2014-02-09 ENCOUNTER — Non-Acute Institutional Stay (SKILLED_NURSING_FACILITY): Payer: Medicare Other | Admitting: Adult Health

## 2014-02-09 DIAGNOSIS — N400 Enlarged prostate without lower urinary tract symptoms: Secondary | ICD-10-CM

## 2014-02-09 DIAGNOSIS — I10 Essential (primary) hypertension: Secondary | ICD-10-CM

## 2014-02-09 DIAGNOSIS — K219 Gastro-esophageal reflux disease without esophagitis: Secondary | ICD-10-CM

## 2014-02-09 DIAGNOSIS — E785 Hyperlipidemia, unspecified: Secondary | ICD-10-CM

## 2014-02-09 DIAGNOSIS — I350 Nonrheumatic aortic (valve) stenosis: Secondary | ICD-10-CM

## 2014-02-09 DIAGNOSIS — I5032 Chronic diastolic (congestive) heart failure: Secondary | ICD-10-CM

## 2014-02-09 MED ORDER — OXYCODONE HCL 5 MG PO TABS: ORAL_TABLET | ORAL | Status: DC

## 2014-02-10 ENCOUNTER — Encounter: Payer: Self-pay | Admitting: Adult Health

## 2014-02-10 DIAGNOSIS — K219 Gastro-esophageal reflux disease without esophagitis: Secondary | ICD-10-CM | POA: Insufficient documentation

## 2014-02-10 DIAGNOSIS — E785 Hyperlipidemia, unspecified: Secondary | ICD-10-CM | POA: Insufficient documentation

## 2014-02-10 DIAGNOSIS — N400 Enlarged prostate without lower urinary tract symptoms: Secondary | ICD-10-CM | POA: Insufficient documentation

## 2014-02-10 NOTE — Progress Notes (Signed)
Patient ID: Douglas Boyden Sr., male   DOB: 04-01-1927, 80 y.o.   MRN: [id number]  [facility]     No Known Allergies     Chief Complaint  Patient presents with  . Hospitalization Follow-up    HPI:  He has been hospitalized for a transcatheter aortic valve replacement. He is here for short term rehab with his goal to return back home. More than likely he will return back home in the next 1-2 weeks.  He is not voicing any complaints or concerns at this time. The nursing staff is not voicing any concerns.   Past Medical History  Diagnosis Date  . Hyperlipidemia 09/05/95  . Diverticulosis of colon 06/05/2002  . Benign prostatic hypertrophy 10/18/00  . Hypertension 05/19/03  . CHF (congestive heart failure) 04/21/03  . Scoliosis   . Colon polyps 06/05/2002    path could not be found   . Chronic diastolic congestive heart failure   . Aortic valve stenosis 09/10/2012  . Back pain   . Arthritis   . Cancer     skin cancers  . Thinning of skin     bruises easy.  . 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 Knee arthroscopy  06/1997    right  . Colonoscopy w/ biopsies  05/2002  . Flexible sigmoidoscopy  07/2005  . Cardiac catheterization  10/27/2013  . Tee without cardioversion    . Tonsillectomy    . Eye surgery      bilateral cataracts    VITAL SIGNS BP 113/86 mmHg  Pulse 72  Ht 5\' 8"  (1.727 m)  Wt 142 lb (64.411 kg)  BMI 21.60 kg/m2   Outpatient Encounter Prescriptions as of 02/09/2014  Medication Sig  . amLODipine (NORVASC) 10 MG tablet Take 1 tablet (10 mg total) by mouth daily.  Marland Kitchen aspirin 81 MG EC tablet Take 81 mg by mouth daily.    Marland Kitchen atorvastatin (LIPITOR) 20 MG tablet Take 1 tablet (20 mg total) by mouth at bedtime.  . clopidogrel (PLAVIX) 75 MG tablet Take 1 tablet (75 mg total) by mouth daily  with breakfast.  . finasteride (PROSCAR) 5 MG tablet Take 5 mg by mouth daily.  . furosemide (LASIX) 20 MG tablet Take 1 tablet (20 mg total) by mouth daily as needed for edema.  . metoprolol tartrate (LOPRESSOR) 25 MG tablet Take 1 tablet (25 mg total) by mouth 2 (two) times daily.  . Multiple Vitamin (MULTIVITAMIN) tablet Take 1 tablet by mouth daily.    . Omega-3 Fatty Acids (FISH OIL) 1000 MG CAPS Take 1,000 mg by mouth daily.   Marland Kitchen omeprazole (PRILOSEC) 20 MG capsule Take 1 capsule (20 mg total) by mouth daily.  Marland Kitchen oxyCODONE (ROXICODONE) 5 MG immediate release tablet Take 1 tablet=5 mg by mouth three times daily as needed for moderate pain; take 2 tablets=10 mg by mouth three times a day as needed for severe pain.  . Oxycodone HCl 10 MG TABS Take 5-10 mg by mouth 3 (three) times daily as needed (for pain).   . potassium chloride SA (K-DUR,KLOR-CON) 20 MEQ tablet Take 1 tablet (20 mEq total) by mouth daily.  . TRAVATAN Z 0.004 % SOLN ophthalmic solution Place 1 drop into the right eye at bedtime.   . vitamin C (ASCORBIC ACID) 500 MG tablet Take 1,000 mg  by mouth daily.   . vitamin E 400 UNIT capsule Take 400 Units by mouth daily.    . [DISCONTINUED] multivitamin-lutein (OCUVITE-LUTEIN) CAPS Take 1 capsule by mouth daily.       SIGNIFICANT DIAGNOSTIC EXAMS   01-30-14: chest x-ray: Chronic changes.  No active cardiopulmonary disease.  02-04-14: 2-d echo: Left ventricle: The cavity size was normal. Wall thickness was normal. Systolic function was normal. The estimated ejection fraction was in the range of 55% to 60%. Wall motion was normal; there were no regional wall motion abnormalities. Features are consistent with a pseudonormal left ventricular filling pattern, with concomitant abnormal relaxation and increased filling pressure (grade 2 diastolic dysfunction). - Aortic valve: S/P aortic stent valve placement. It appears well seated and the leaflets move normally. There was mild perivalvular  regurgitation. - Mitral valve: There was mild regurgitation. - Left atrium: The atrium was moderately dilated. - Pulmonary arteries: Systolic pressure was mildly increased. PA peak pressure: 43 mm Hg (S).   LABS REVIEWED:  01-30-14: wbc 5.6; hgb 11.7; hct 34.0; mcv 90.2; plt 123; glucose 95; bun 21; creat 0.76; k+3.9; na++140; liver normal albumin 3.5; hgb a1c 5.5 02-05-14: wbc 5.5; hgb 10.7; hct 31.5; mcv 91.3;l plt 80      Review of Systems  Constitutional: Negative for malaise/fatigue.  Respiratory: Negative for cough and shortness of breath.   Cardiovascular: Negative for chest pain, palpitations and leg swelling.  Gastrointestinal: Negative for heartburn, abdominal pain and constipation.  Musculoskeletal: Negative for myalgias, back pain and joint pain.  Skin: Negative.   Psychiatric/Behavioral: Negative for depression. The patient is not nervous/anxious.      Physical Exam  Constitutional: He is oriented to person, place, and time. No distress.  thin  Neck: Neck supple. No JVD present. No thyromegaly present.  Cardiovascular: Normal rate, regular rhythm and intact distal pulses.   Respiratory: Effort normal and breath sounds normal. No respiratory distress. He has no wheezes.  GI: Soft. Bowel sounds are normal. He exhibits no distension. There is no tenderness.  Musculoskeletal: He exhibits no edema.  Is able to move all extremities; is ambulating with a walker in therapy  Has significant scoliosis   Neurological: He is alert and oriented to person, place, and time.  Skin: Skin is warm and dry. He is not diaphoretic.  Left groin with 3.5 x 0.5 cm incisional area without signs of infection present.   Psychiatric: He has a normal mood and affect.       ASSESSMENT/ PLAN:  1. Severe aortic valve stenosis: is status post transcatheter aortic valve replacement. Will continue therapy as directed; will follow up with cardiology as indicated. Will continue asa 81 mg daily  and plavix 75 mg daily and will monitor his status.   2. Hypertension: will continue norvasc 10 mg daily and lopressor 25 mg twice daily and will monitor   3. Chronic diastolic heart failure: is stable will continue lopressor 25 mg twice daily and lasix 20 mg daily as needed and will continue to monitor her status.   4. Hypokalemia: will continue k+ 20 meq daily   5. Dyslipidemia: will continue lipitor 20 mg daily and fish oil 1 gm daily   6. Jerrye Bushy: will continue prilosec 20 mg daily   7. Bph: will continue proscar 5 mg daily   8. Glaucoma: will continue travatan to right eye nightly    Time spent with patient 50 minutes.   Ok Edwards NP [facility]  Contact [phone] Monday through  Friday 8am- 5pm  After hours call [phone]

## 2014-02-16 ENCOUNTER — Non-Acute Institutional Stay (SKILLED_NURSING_FACILITY): Payer: Medicare Other | Admitting: Internal Medicine

## 2014-02-16 ENCOUNTER — Encounter: Payer: Self-pay | Admitting: Internal Medicine

## 2014-02-16 DIAGNOSIS — K219 Gastro-esophageal reflux disease without esophagitis: Secondary | ICD-10-CM

## 2014-02-16 DIAGNOSIS — M546 Pain in thoracic spine: Secondary | ICD-10-CM

## 2014-02-16 DIAGNOSIS — I10 Essential (primary) hypertension: Secondary | ICD-10-CM

## 2014-02-16 DIAGNOSIS — I350 Nonrheumatic aortic (valve) stenosis: Secondary | ICD-10-CM

## 2014-02-16 DIAGNOSIS — K5900 Constipation, unspecified: Secondary | ICD-10-CM

## 2014-02-16 DIAGNOSIS — D62 Acute posthemorrhagic anemia: Secondary | ICD-10-CM

## 2014-02-16 DIAGNOSIS — I5032 Chronic diastolic (congestive) heart failure: Secondary | ICD-10-CM

## 2014-02-16 DIAGNOSIS — E785 Hyperlipidemia, unspecified: Secondary | ICD-10-CM

## 2014-02-16 LAB — BASIC METABOLIC PANEL
BUN: 18 mg/dL (ref 4–21)
Creatinine: 0.8 mg/dL (ref 0.6–1.3)
Glucose: 77 mg/dL
POTASSIUM: 4.2 mmol/L (ref 3.4–5.3)
Sodium: 138 mmol/L (ref 137–147)

## 2014-02-16 LAB — CBC AND DIFFERENTIAL
HEMATOCRIT: 33 % — AB (ref 41–53)
HEMOGLOBIN: 10.8 g/dL — AB (ref 13.5–17.5)
PLATELETS: 163 10*3/uL (ref 150–399)
WBC: 5.8 10*3/mL

## 2014-02-16 NOTE — Progress Notes (Signed)
Patient ID: Douglas Boyden Sr., male   DOB: 15-Sep-1927, 80 y.o.   MRN: [id number]     Facility: [facility]    PCP: Sherren Mocha, MD  Code Status: full code  No Known Allergies  Chief Complaint  Patient presents with  . New Admit To SNF     HPI:  80 y/o male pt is here for STR post hospital admission from 02/03/14-02/06/14 with severe AS and underwent AVR. Post op echocardiogram showed prosthetic valves to be operating normally. He had elevated bp in hospital and his amlodipine dosing was increased. His bp is stable in facility. He is seen in his room today. He denies any concerns. He is walking from his room to the restroom unassisted.  He has history of chronic diastolic HF, HTN, hemorrhoids, severe kyphoscoliosis  Review of Systems:  Constitutional: Negative for fever, chills, diaphoresis.  HENT: Negative for congestion  Eyes: Negative for eye pain, blurred vision, double vision and discharge.  Respiratory: Negative for cough, sputum production, shortness of breath and wheezing.   Cardiovascular: Negative for chest pain, palpitations, orthopnea and leg swelling.  Gastrointestinal: Negative for heartburn, nausea, vomiting, abdominal pain. Has bowel movement once a week Genitourinary: Negative for dysuria, urgency, frequency and flank pain.  Musculoskeletal: Negative for falls. Has chronic back pain Skin: Negative for itching, rash.  Neurological: Negative for weakness,dizziness, tingling, focal weakness and headaches.  Psychiatric/Behavioral: Negative for depression  Past Medical History  Diagnosis Date  . Hyperlipidemia 09/05/95  . Diverticulosis of colon 06/05/2002  . Benign prostatic hypertrophy 10/18/00  . Hypertension 05/19/03  . CHF (congestive heart failure) 04/21/03  . Scoliosis   . Colon polyps 06/05/2002    path could not be found   . Chronic diastolic congestive heart failure   . Aortic valve stenosis 09/10/2012  . Back pain   .  Arthritis   . Cancer     skin cancers  . Thinning of skin     bruises easy.  . 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 Knee arthroscopy  06/1997    right  . Colonoscopy w/ biopsies  05/2002  . Flexible sigmoidoscopy  07/2005  . Cardiac catheterization  10/27/2013  . Tee without cardioversion    . Tonsillectomy    . Eye surgery      bilateral cataracts  . Transcatheter aortic valve replacement, transfemoral N/A 02/03/2014    Procedure: TRANSCATHETER AORTIC VALVE REPLACEMENT, TRANSFEMORAL;  Surgeon: Sherren Mocha, MD;  Location: [facility];  Service: Open Heart Surgery;  Laterality: N/A;  . Intraoperative transesophageal echocardiogram N/A 02/03/2014    Procedure: INTRAOPERATIVE TRANSESOPHAGEAL ECHOCARDIOGRAM;  Surgeon: Sherren Mocha, MD;  Location: [facility] OR;  Service: Open Heart Surgery;  Laterality: N/A;   Social History:   reports that he quit smoking about 43 years ago. His smoking use included Cigarettes. He has a 20 pack-year smoking history. He has quit using smokeless tobacco. He reports that he does not drink alcohol or use illicit drugs.  Family History  Problem Relation Age of Onset  . Stroke Father   . Diabetes Brother   . Hypertension Other   . Hip fracture Sister     after a fall  . Diabetes Brother   . Coronary artery disease Brother     carotid stenosis  . Colon cancer Neg Hx   .  Prostate cancer Neg Hx     Medications: Patient's Medications  New Prescriptions   No medications on file  Previous Medications   AMLODIPINE (NORVASC) 10 MG TABLET    Take 1 tablet (10 mg total) by mouth daily.   ASPIRIN 81 MG EC TABLET    Take 81 mg by mouth daily.     ATORVASTATIN (LIPITOR) 20 MG TABLET    Take 1 tablet (20 mg total) by mouth at bedtime.   CLOPIDOGREL (PLAVIX) 75 MG TABLET    Take 1 tablet (75 mg total)  by mouth daily with breakfast.   FINASTERIDE (PROSCAR) 5 MG TABLET    Take 5 mg by mouth daily.   FUROSEMIDE (LASIX) 20 MG TABLET    Take 1 tablet (20 mg total) by mouth daily as needed for edema.   METOPROLOL TARTRATE (LOPRESSOR) 25 MG TABLET    Take 1 tablet (25 mg total) by mouth 2 (two) times daily.   MULTIPLE VITAMIN (MULTIVITAMIN) TABLET    Take 1 tablet by mouth daily.     OMEGA-3 FATTY ACIDS (FISH OIL) 1000 MG CAPS    Take 1,000 mg by mouth daily.    OXYCODONE (ROXICODONE) 5 MG IMMEDIATE RELEASE TABLET    Take 1 tablet=5 mg by mouth three times daily as needed for moderate pain; take 2 tablets=10 mg by mouth three times a day as needed for severe pain.   OXYCODONE HCL 10 MG TABS    Take 5-10 mg by mouth 3 (three) times daily as needed (for pain).    PANTOPRAZOLE (PROTONIX) 40 MG TABLET    Take 40 mg by mouth daily.   POTASSIUM CHLORIDE SA (K-DUR,KLOR-CON) 20 MEQ TABLET    Take 1 tablet (20 mEq total) by mouth daily.   SENNOSIDES-DOCUSATE SODIUM (SENOKOT-S) 8.6-50 MG TABLET    Take 1 tablet by mouth 2 (two) times daily.   TRAVATAN Z 0.004 % SOLN OPHTHALMIC SOLUTION    Place 1 drop into the right eye at bedtime.    VITAMIN C (ASCORBIC ACID) 500 MG TABLET    Take 1,000 mg by mouth daily.    VITAMIN E 400 UNIT CAPSULE    Take 400 Units by mouth daily.    Modified Medications   No medications on file  Discontinued Medications   OMEPRAZOLE (PRILOSEC) 20 MG CAPSULE    Take 1 capsule (20 mg total) by mouth daily.     Physical Exam: Filed Vitals:   02/16/14 1047  BP: 131/67  Pulse: 69  Temp: 98.6 F (37 C)  Resp: 18    General- elderly male in no acute distress Head- atraumatic, normocephalic Eyes- PERRLA, EOMI, no pallor, no icterus, no discharge Neck- no cervical lymphadenopathy Throat- moist mucus membrane Cardiovascular- normal s1,s2, no murmurs, palpable dorsalis pedis Respiratory- bilateral clear to auscultation, no wheeze, no rhonchi, no crackles, no use of accessory  muscles Abdomen- bowel sounds present, soft, non tender Musculoskeletal- able to move all 4 extremities, no leg edema, severe kyphoscoliosis, walker in the room but pt not using it Neurological- no focal deficit, alert and oriented Skin- warm and dry, left groin incisional site healing well, no signs of infection Psychiatry- normal mood and affect    Labs reviewed: Basic Metabolic Panel:  Recent Labs  01/30/14 1330  02/03/14 1346 02/03/14 1452 02/04/14 0358 02/05/14 0700  NA 140  < > 139 139 136* 139  K 3.9  < > 3.5* 3.4* 4.1 4.1  CL 104  < > 101  --  102 100  CO2 26  --   --   --  28 29  GLUCOSE 95  < > 96 106* 106* 95  BUN 21  < > 14  --  15 16  CREATININE 0.76  < > 0.60  --  0.76 0.80  CALCIUM 8.8  --   --   --  8.4 8.5  MG  --   --   --   --  1.9  --   < > = values in this interval not displayed. Liver Function Tests:  Recent Labs  09/24/13 1813 01/19/14 1409 01/30/14 1330  AST 21 14 17   ALT 19 <8 12  ALKPHOS 56 49 50  BILITOT 0.8 0.7 0.6  PROT 6.3 6.0 6.2  ALBUMIN 3.7 3.9 3.5   No results for input(s): LIPASE, AMYLASE in the last 8760 hours. No results for input(s): AMMONIA in the last 8760 hours. CBC:  Recent Labs  06/25/13 1646 09/24/13 1813 11/03/13 0925  02/04/14 0358 02/05/14 0025 02/05/14 0700  WBC 6.0 5.2 5.4  < > 5.2 5.5 5.8  NEUTROABS 4.4 3.5 4.1  --   --   --   --   HGB 12.3* 11.6* 11.3*  < > 9.4* 10.7* 10.8*  HCT 36.3* 33.7* 33.3*  < > 28.0* 31.5* 31.6*  MCV 93.1 92.7 92  < > 90.0 91.3 90.3  PLT 187.0 215.0 195  < > 85* 80* 85*  < > = values in this interval not displayed. Cardiac Enzymes: No results for input(s): CKTOTAL, CKMB, CKMBINDEX, TROPONINI in the last 8760 hours. BNP: Invalid input(s): POCBNP CBG:  Recent Labs  02/06/14 0529 02/06/14 0748 02/06/14 1104  GLUCAP 89 83 81    Assessment/Plan  Aortic stenosis S/p AVR. Has f/u with cardiology and valve clinic. Will have him work with physical therapy and occupational  therapy team to help with gait training and muscle strengthening exercises.fall precautions. Skin care. Encourage to be out of bed. Continue his asa and plavix  HTN bp stable. Continue amlodipine 10 mg daily and lopressor 25 mg bid  GERD On pantoprazole in facilty, symptom controlled  HLD Continue lipitor and fish oil  Diastolic chf Stable at present. Continue lasix 20 mg daily prn for edema. Continue lopressor 25 mg bid with asa and plavix. Monitor bmp. Continue kcl supplement. Monitor weight  Anemia Likely post op from blood loss. Check h&h  Chronic back pain Continue oxycodone 5-10 mg tid prn for pain  Constipation On senna s bid. Add miralax 17 g daily given him being on narcotics. reassess     Family/ staff Communication: reviewed care plan with patient and nursing supervisor  Goals of care: short term rehabilitation    Labs/tests ordered: cbc, bmp    Blanchie Serve, MD  [facility] [phone] (Monday-Friday 8 am - 5 pm) [phone] (afterhours)

## 2014-02-19 ENCOUNTER — Encounter: Payer: Self-pay | Admitting: Registered Nurse

## 2014-02-19 ENCOUNTER — Non-Acute Institutional Stay (SKILLED_NURSING_FACILITY): Payer: Medicare Other | Admitting: Registered Nurse

## 2014-02-19 DIAGNOSIS — I5032 Chronic diastolic (congestive) heart failure: Secondary | ICD-10-CM

## 2014-02-19 DIAGNOSIS — H409 Unspecified glaucoma: Secondary | ICD-10-CM

## 2014-02-19 DIAGNOSIS — K219 Gastro-esophageal reflux disease without esophagitis: Secondary | ICD-10-CM

## 2014-02-19 DIAGNOSIS — Z952 Presence of prosthetic heart valve: Secondary | ICD-10-CM

## 2014-02-19 DIAGNOSIS — I10 Essential (primary) hypertension: Secondary | ICD-10-CM

## 2014-02-19 DIAGNOSIS — Z954 Presence of other heart-valve replacement: Secondary | ICD-10-CM

## 2014-02-19 DIAGNOSIS — E785 Hyperlipidemia, unspecified: Secondary | ICD-10-CM

## 2014-02-19 DIAGNOSIS — M546 Pain in thoracic spine: Secondary | ICD-10-CM

## 2014-02-19 DIAGNOSIS — N400 Enlarged prostate without lower urinary tract symptoms: Secondary | ICD-10-CM

## 2014-02-19 NOTE — Progress Notes (Signed)
Patient ID: Douglas Boyden Sr., male   DOB: 12-Aug-1927, 80 y.o.   MRN: [id number]   Place of Service: [facility]  No Known Allergies  Code Status: Full Code  Goals of Care: Longevity/STR  Chief Complaint  Patient presents with  . Discharge Note    HPI 80 y.o. male with PMH of sever AS, HTN, CHF, kyphoscoliosis, chronic back pain is being seen for a discharge visit. Patient was here for short-term rehabilitation post hospital admission from 02/03/14 to 02/06/14 fr TAVR. Patient has worked with therapy team and is ready to be discharged home with [facility] PT/OT.   Review of Systems Constitutional: Negative for fever and chills HENT: Negative for ear pain, congestion, and sore throat Eyes: Negative for eye pain, eye discharge, and visual disturbance  Cardiovascular: Negative for chest pain, palpitations, and leg swelling Respiratory: Negative cough, shortness of breath, and wheezing.  Gastrointestinal: Negative for nausea and vomiting. Negative for abdominal pain. Genitourinary: Negative for  dysuria and hematuria Endocrine: Negative for polydipsia, polyphagia, and polyuria Musculoskeletal: Positive for back pain Neurological: Negative for dizziness and headache Skin: Negative for rash   Psychiatric: Negative for depression  Past Medical History  Diagnosis Date  . Hyperlipidemia 09/05/95  . Diverticulosis of colon 06/05/2002  . Benign prostatic hypertrophy 10/18/00  . Hypertension 05/19/03  . CHF (congestive heart failure) 04/21/03  . Scoliosis   . Colon polyps 06/05/2002    path could not be found   . Chronic diastolic congestive heart failure   . Aortic valve stenosis 09/10/2012  . Back pain   . Arthritis   . Cancer     skin cancers  . Thinning of skin     bruises easy.  . 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 Knee arthroscopy  07-10-1997    right  . Colonoscopy w/ biopsies  05/2002  . Flexible sigmoidoscopy  07/2005  . Cardiac catheterization  10/27/2013  . Tee without cardioversion    . Tonsillectomy    . Eye surgery      bilateral cataracts  . Transcatheter aortic valve replacement, transfemoral N/A 02/03/2014    Procedure: TRANSCATHETER AORTIC VALVE REPLACEMENT, TRANSFEMORAL;  Surgeon: Sherren Mocha, MD;  Location: [place];  Service: Open Heart Surgery;  Laterality: N/A;  . Intraoperative transesophageal echocardiogram N/A 02/03/2014    Procedure: INTRAOPERATIVE TRANSESOPHAGEAL ECHOCARDIOGRAM;  Surgeon: Sherren Mocha, MD;  Location: [facility] OR;  Service: Open Heart Surgery;  Laterality: N/A;    History   Social History  . Marital Status: Married    Spouse Name: N/A    Number of Children: N/A  . Years of Education: N/A   Occupational History  . retired     [employer]   Social History Main Topics  . Smoking status: Former Smoker -- 1.00 packs/day for 20 years    Types: Cigarettes    Quit date: 01/11/1971  . Smokeless tobacco: Former Systems developer     Comment: quit 40 years ago  . Alcohol Use: No  . Drug Use: No  . Sexual Activity: No   Other Topics Concern  . Not on file   Social History Narrative   Occupation: Retired Secretary/administrator   Married (second marriage), lives with wife   Ellis Savage '51-'54.     Son died 10-Jul-2008, he had cirrhosis.  Family History  Problem Relation Age of Onset  . Stroke Father   . Diabetes Brother   . Hypertension Other   . Hip fracture Sister     after a fall  . Diabetes Brother   . Coronary artery disease Brother     carotid stenosis  . Colon cancer Neg Hx   . Prostate cancer Neg Hx      Medication List       This list is accurate as of: 02/19/14 11:40 AM.  Always use your most recent med list.               amLODipine 10 MG tablet  Commonly known as:  NORVASC  Take 1 tablet (10 mg total) by mouth daily.       aspirin 81 MG EC tablet  Take 81 mg by mouth daily.     atorvastatin 20 MG tablet  Commonly known as:  LIPITOR  Take 1 tablet (20 mg total) by mouth at bedtime.     clopidogrel 75 MG tablet  Commonly known as:  PLAVIX  Take 1 tablet (75 mg total) by mouth daily with breakfast.     finasteride 5 MG tablet  Commonly known as:  PROSCAR  Take 5 mg by mouth daily.     Fish Oil 1000 MG Caps  Take 1,000 mg by mouth daily.     furosemide 20 MG tablet  Commonly known as:  LASIX  Take 1 tablet (20 mg total) by mouth daily as needed for edema.     metoprolol tartrate 25 MG tablet  Commonly known as:  LOPRESSOR  Take 1 tablet (25 mg total) by mouth 2 (two) times daily.     multivitamin tablet  Take 1 tablet by mouth daily.     Oxycodone HCl 10 MG Tabs  Take 5-10 mg by mouth 3 (three) times daily as needed (for pain).     oxyCODONE 5 MG immediate release tablet  Commonly known as:  ROXICODONE  Take 1 tablet=5 mg by mouth three times daily as needed for moderate pain; take 2 tablets=10 mg by mouth three times a day as needed for severe pain.     pantoprazole 40 MG tablet  Commonly known as:  PROTONIX  Take 40 mg by mouth daily.     potassium chloride SA 20 MEQ tablet  Commonly known as:  K-DUR,KLOR-CON  Take 1 tablet (20 mEq total) by mouth daily.     sennosides-docusate sodium 8.6-50 MG tablet  Commonly known as:  SENOKOT-S  Take 1 tablet by mouth 2 (two) times daily.     TRAVATAN Z 0.004 % Soln ophthalmic solution  Generic drug:  Travoprost (BAK Free)  Place 1 drop into the right eye at bedtime.     vitamin C 500 MG tablet  Commonly known as:  ASCORBIC ACID  Take 1,000 mg by mouth daily.     vitamin E 400 UNIT capsule  Take 400 Units by mouth daily.        Physical Exam Filed Vitals:   02/19/14 1041  BP: 115/64  Pulse: 65  Temp: 97.1 F (36.2 C)  Resp: 17   Constitutional: frail elderly male in no acute distress. Conversant and pleasant HEENT:  Normocephalic and atraumatic. PERRL. EOM intact. No icterus. No nasal discharge or sinus tenderness. Oral mucosa moist. Posterior pharynx clear of any exudate or lesions.  Neck: Supple and nontender. No lymphadenopathy, masses, or thyromegaly. No JVD or carotid bruits. Cardiac: Normal S1,  S2. RRR without appreciable murmurs, rubs, or gallops. Distal pulses intact. No dependent edema.  Lungs: No respiratory distress. Breath sounds clear bilaterally without rales, rhonchi, or wheezes. Abdomen: Audible bowel sounds in all quadrants. Soft, nontender, nondistended.  Musculoskeletal: able to move all extremities. Marked kyphoscoliosis  Skin: Warm and dry. No rash noted. No erythema.  Neurological: Alert and oriented to person, place, and time.  Psychiatric: Judgment and insight adequate. Appropriate mood and affect.   Labs Reviewed  CBC Latest Ref Rng 02/16/2014 02/05/2014 02/05/2014  WBC - 5.8 5.8 5.5  Hemoglobin 13.5 - 17.5 g/dL 10.8(A) 10.8(L) 10.7(L)  Hematocrit 41 - 53 % 33(A) 31.6(L) 31.5(L)  Platelets 150 - 399 K/L 163 85(L) 80(L)    CMP Latest Ref Rng 02/16/2014 02/05/2014 02/04/2014  Glucose 70 - 99 mg/dL - 95 106(H)  BUN 4 - 21 mg/dL 18 16 15   Creatinine 0.6 - 1.3 mg/dL 0.8 0.80 0.76  Sodium 137 - 147 mmol/L 138 139 136(L)  Potassium 3.4 - 5.3 mmol/L 4.2 4.1 4.1  Chloride 96 - 112 mEq/L - 100 102  CO2 19 - 32 mEq/L - 29 28  Calcium 8.4 - 10.5 mg/dL - 8.5 8.4  Total Protein 6.0 - 8.3 g/dL - - -  Total Bilirubin 0.3 - 1.2 mg/dL - - -  Alkaline Phos 39 - 117 U/L - - -  AST 0 - 37 U/L - - -  ALT 0 - 53 U/L - - -    Assessment & Plan 1. S/P TAVR (transcatheter aortic valve replacement) Continue HH PT/OT for gait/balance/strength training. Continue low dose asa and plavix 75mg  daily. F/u with cardiology  2. Chronic diastolic congestive heart failure Euvolemic on exam. Continue lasix 20mg  daily a needed for edema. Continue lopressor 25mg  twice daily with asa and plavix. Continue  potassium supplement 74mEq daily.   3. Essential hypertension Stable. BPs on low side. Continue amlodipine 10mg  daily and lopressor 25mg  twice daily.  4. BPH (benign prostatic hyperplasia) Continue finasteride 5mg  daily  5. Thoracic back pain, unspecified back pain laterality Continue oxycodone 5mg  1-2 tabs 3 times daily as needed for pain.   6. Dyslipidemia Continue lipitor 20mg  daily and fish oil supplement   7. Gastroesophageal reflux disease without esophagitis Continue protonix 40mg  daily  8. Glaucoma Continue travatan eye drop in Right eye at bedtime.    Home health services: PT/OT DME required: None PCP follow-up: 02/25/14 @ 3:15pm with Dr. Elsie Stain 30-day supply of prescription medications provided. (#60 oxycodone 5mg )  Family/Staff Communication Plan of care discussed with patient and nursing staff. Patient and nursing staff verbalized understanding and agree with plan of care. No additional questions or concerns reported.    Arthur Holms, MSN, AGNP-C [facility] [address] [place], [place] [address] [phone] [8am-5pm] After hours: [phone]

## 2014-02-23 ENCOUNTER — Encounter: Payer: Self-pay | Admitting: Physician Assistant

## 2014-02-23 ENCOUNTER — Ambulatory Visit (INDEPENDENT_AMBULATORY_CARE_PROVIDER_SITE_OTHER): Payer: Medicare Other | Admitting: Physician Assistant

## 2014-02-23 VITALS — BP 122/70 | HR 72 | Ht 70.0 in | Wt 145.0 lb

## 2014-02-23 DIAGNOSIS — I10 Essential (primary) hypertension: Secondary | ICD-10-CM

## 2014-02-23 DIAGNOSIS — I5032 Chronic diastolic (congestive) heart failure: Secondary | ICD-10-CM

## 2014-02-23 DIAGNOSIS — Z954 Presence of other heart-valve replacement: Secondary | ICD-10-CM

## 2014-02-23 DIAGNOSIS — I350 Nonrheumatic aortic (valve) stenosis: Secondary | ICD-10-CM

## 2014-02-23 DIAGNOSIS — Z952 Presence of prosthetic heart valve: Secondary | ICD-10-CM

## 2014-02-23 NOTE — Assessment & Plan Note (Signed)
Blood pressure controlled on increased amlodipine.

## 2014-02-23 NOTE — Assessment & Plan Note (Signed)
No evidence of heart failure on exam 

## 2014-02-23 NOTE — Assessment & Plan Note (Signed)
Patient is doing excellent status post TAVR. He's had noticeable improvement in his breathing. He has an appointment for a follow-up 2-D echo and see Dr. Burt Knack next month.

## 2014-02-23 NOTE — Progress Notes (Signed)
HPI: This is an 80 year old male patient of Dr. Burt Knack who underwent TAVR 02/03/14. Postop echo revealed prosthetic valve was operating normally with mild perivalvular regurgitation. Amlodipine was increased to 10 mg for better blood pressure control. He went to a short-term nursing facility for rehabilitation. Patient also has hypertension, chronic diastolic heart failure, and  Kyphoscoliosis.  Patient is back home after a short stay at the nursing home. He is walking without difficulty, but still limited by his kyphoscoliosis. He denies any chest pain, dyspnea, dyspnea on exertion, dizziness or presyncope. He says he started to notice a difference in his improved breathing. He said he had a little trouble with his groin when he would bend over or bend his leg up but that has gotten better.  No Known Allergies   Current Outpatient Prescriptions  Medication Sig Dispense Refill  . amLODipine (NORVASC) 10 MG tablet Take 1 tablet (10 mg total) by mouth daily. 30 tablet 5  . aspirin 81 MG EC tablet Take 81 mg by mouth daily.      Marland Kitchen atorvastatin (LIPITOR) 20 MG tablet Take 1 tablet (20 mg total) by mouth at bedtime. 30 tablet 12  . clopidogrel (PLAVIX) 75 MG tablet Take 1 tablet (75 mg total) by mouth daily with breakfast. 30 tablet 11  . finasteride (PROSCAR) 5 MG tablet Take 5 mg by mouth daily.    . furosemide (LASIX) 20 MG tablet Take 1 tablet (20 mg total) by mouth daily as needed for edema. 30 tablet 1  . metoprolol tartrate (LOPRESSOR) 25 MG tablet Take 1 tablet (25 mg total) by mouth 2 (two) times daily. 60 tablet 6  . Multiple Vitamin (MULTIVITAMIN) tablet Take 1 tablet by mouth daily.      . Omega-3 Fatty Acids (FISH OIL) 1000 MG CAPS Take 1,000 mg by mouth daily.     Marland Kitchen oxyCODONE (ROXICODONE) 5 MG immediate release tablet Take 1 tablet=5 mg by mouth three times daily as needed for moderate pain; take 2 tablets=10 mg by mouth three times a day as needed for severe pain. 180  tablet 0  . Oxycodone HCl 10 MG TABS Take 5-10 mg by mouth 3 (three) times daily as needed (for pain).     . pantoprazole (PROTONIX) 40 MG tablet Take 40 mg by mouth daily.    . potassium chloride SA (K-DUR,KLOR-CON) 20 MEQ tablet Take 1 tablet (20 mEq total) by mouth daily. 30 tablet 5  . sennosides-docusate sodium (SENOKOT-S) 8.6-50 MG tablet Take 1 tablet by mouth 2 (two) times daily.    . TRAVATAN Z 0.004 % SOLN ophthalmic solution Place 1 drop into the right eye at bedtime.     . vitamin C (ASCORBIC ACID) 500 MG tablet Take 1,000 mg by mouth daily.     . vitamin E 400 UNIT capsule Take 400 Units by mouth daily.       No current facility-administered medications for this visit.    Past Medical History  Diagnosis Date  . Hyperlipidemia 09/05/95  . Diverticulosis of colon 06/05/2002  . Benign prostatic hypertrophy 10/18/00  . Hypertension 05/19/03  . CHF (congestive heart failure) 04/21/03  . Scoliosis   . Colon polyps 06/05/2002    path could not be found   . Chronic diastolic congestive heart failure   . Aortic valve stenosis 09/10/2012  . Back pain   . Arthritis   . Cancer     skin cancers  . Thinning of skin  bruises easy.  . 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 Knee arthroscopy  06/25/1997    right  . Colonoscopy w/ biopsies  05/2002  . Flexible sigmoidoscopy  07/2005  . Cardiac catheterization  10/27/2013  . Tee without cardioversion    . Tonsillectomy    . Eye surgery      bilateral cataracts  . Transcatheter aortic valve replacement, transfemoral N/A 02/03/2014    Procedure: TRANSCATHETER AORTIC VALVE REPLACEMENT, TRANSFEMORAL;  Surgeon: Sherren Mocha, MD;  Location: [place];  Service: Open Heart Surgery;  Laterality: N/A;  . Intraoperative transesophageal echocardiogram N/A 02/03/2014    Procedure:  INTRAOPERATIVE TRANSESOPHAGEAL ECHOCARDIOGRAM;  Surgeon: Sherren Mocha, MD;  Location: [facility] OR;  Service: Open Heart Surgery;  Laterality: N/A;    Family History  Problem Relation Age of Onset  . Stroke Father   . Diabetes Brother   . Hypertension Other   . Hip fracture Sister     after a fall  . Diabetes Brother   . Coronary artery disease Brother     carotid stenosis  . Colon cancer Neg Hx   . Prostate cancer Neg Hx     History   Social History  . Marital Status: Married    Spouse Name: N/A    Number of Children: N/A  . Years of Education: N/A   Occupational History  . retired     [employer]   Social History Main Topics  . Smoking status: Former Smoker -- 1.00 packs/day for 20 years    Types: Cigarettes    Quit date: 01/11/1971  . Smokeless tobacco: Former Systems developer     Comment: quit 40 years ago  . Alcohol Use: No  . Drug Use: No  . Sexual Activity: No   Other Topics Concern  . Not on file   Social History Narrative   Occupation: Retired Secretary/administrator   Married (second marriage), lives with wife   Ellis Savage '51-'54.     Son died 25-Jun-2008, he had cirrhosis.      ROS: See history of present illness otherwise negative  BP 122/70 mmHg  Pulse 72  Ht 5\' 10"  (1.778 m)  Wt 145 lb (65.772 kg)  BMI 20.81 kg/m2  PHYSICAL EXAM: Thin, in no acute distress. Neck: No JVD, HJR, Bruit, or thyroid enlargement  Lungs: No tachypnea, clear without wheezing, rales, or rhonchi  Cardiovascular: RRR, PMI not displaced, Normal S1 and S2, positive S4, 1 to 2/6 systolic murmur at the lower left sternal border, no bruit, thrill, or heave.  Abdomen: BS normal. Soft without organomegaly, masses, lesions or tenderness.  Extremities: Left groin at catheter insertion site looks good without hematoma or hemorrhage good femoral and distal pulses otherwise lower extremities without cyanosis, clubbing or edema. Good distal pulses bilateral  SKin: Warm, no lesions or rashes     Musculoskeletal: No deformities  Neuro: no focal signs   Wt Readings from Last 3 Encounters:  02/19/14 146 lb (66.225 kg)  02/09/14 142 lb (64.411 kg)  02/06/14 135 lb 4.8 oz (61.372 kg)    Lab Results  Component Value Date   WBC 5.8 02/16/2014   HGB 10.8* 02/16/2014   HCT 33* 02/16/2014   PLT 163 02/16/2014   GLUCOSE 95 02/05/2014   CHOL 170 02/27/2013   TRIG 42.0 02/27/2013  HDL 72.90 02/27/2013   LDLCALC 89 02/27/2013   ALT 12 01/30/2014   AST 17 01/30/2014   NA 138 02/16/2014   K 4.2 02/16/2014   CL 100 02/05/2014   CREATININE 0.8 02/16/2014   BUN 18 02/16/2014   CO2 29 02/05/2014   TSH 0.50 06/25/2013   PSA 0.23 09/24/2013   INR 1.34 02/03/2014   HGBA1C 5.5 01/30/2014    EKG: Normal sinus rhythm, no acute change   [facility]  TAVR OPERATIVE NOTE   Date of Procedure:               02/03/2014 Preoperative Diagnosis:     Severe Aortic Stenosis  Postoperative Diagnosis:    Same   Procedure:         Transcatheter Aortic Valve Replacement - Transfemoral Approach             Edwards Sapien 3 THV (size 26 mm, model # 9300TFX, serial # [id number])              Co-Surgeons:                        Valentina Gu. Roxy Manns, MD and Sherren Mocha, MD Assistants:                             Gaye Pollack, MD and Lauree Chandler, MD Anesthesiologist:                  Oleta Mouse, MD Echocardiographer:             Ena Dawley, MD  Pre-operative Echo Findings:  severe aortic stenosis  normal left ventricular systolic function  Mild MR, Mild TR   Post-operative Echo Findings:  trace paravalvular leak  normal left ventricular systolic function  Mild MR/TR  2-D echo 02/04/14 Study Conclusions  - Left ventricle: The cavity size was normal. Wall thickness was   normal. Systolic function was normal. The estimated ejection   fraction was in the range of 55% to 60%. Wall motion was normal;   there were no regional wall motion  abnormalities. Features are   consistent with a pseudonormal left ventricular filling pattern,   with concomitant abnormal relaxation and increased filling   pressure (grade 2 diastolic dysfunction). - Aortic valve: S/P aortic stent valve placement. It appears well   seated and the leaflets move normally. There was mild   perivalvular regurgitation. - Mitral valve: There was mild regurgitation. - Left atrium: The atrium was moderately dilated. - Pulmonary arteries: Systolic pressure was mildly increased. PA   peak pressure: 43 mm Hg (S).

## 2014-02-23 NOTE — Patient Instructions (Signed)
Please keep your scheduled follow-up in January with Dr Burt Knack.

## 2014-02-25 ENCOUNTER — Ambulatory Visit (INDEPENDENT_AMBULATORY_CARE_PROVIDER_SITE_OTHER): Payer: Medicare Other | Admitting: Family Medicine

## 2014-02-25 ENCOUNTER — Encounter: Payer: Self-pay | Admitting: Family Medicine

## 2014-02-25 VITALS — BP 128/70 | HR 72 | Temp 98.8°F | Wt 147.5 lb

## 2014-02-25 DIAGNOSIS — I350 Nonrheumatic aortic (valve) stenosis: Secondary | ICD-10-CM

## 2014-02-25 NOTE — Progress Notes (Signed)
Pre visit review using our clinic review tool, if applicable. No additional management support is needed unless otherwise documented below in the visit note.  To recap, S/p TAVR then to [facility] place for rehab.  Now home for a few days.   Not needing lasix/potassium since coming home.   No CP, breathing is improved, minimal to no BLE edema, people have remarked that he "looks better overall."   We talked about nutritional status, was getting 3 meals a day at [place], had been less at home.    We reviewed all his meds and their rationale.   Meds, vitals, and allergies reviewed.   ROS: See HPI.  Otherwise, noncontributory.  nad ncat Mmm Neck supple Obvious scoliosis Rrr, no murmur heard ctab abd soft Ext w/ trace edema, much improved from prev and this is w/o recent lasix use.

## 2014-02-25 NOTE — Patient Instructions (Signed)
Take care. Glad to see you.  If you have some swelling then take the furosemide and the potassium together.  Work on getting 3 meals a day but try to avoid salt.

## 2014-02-26 NOTE — Assessment & Plan Note (Signed)
Much improved clinically.  I'm ecstatic that he got the valve replaced.  He appears to be doing really well overall, considering his situation.  D/w pt about nutritional support at this point.  He needs to be getting adequate caloric intake. He agreed and he'll work on it.  Continue prn lasix.  He agrees.  F/u with me as needed at this point.  I'll await cards input, he is scheduled for routine f/u.

## 2014-03-17 ENCOUNTER — Encounter: Payer: Self-pay | Admitting: Family Medicine

## 2014-03-17 ENCOUNTER — Ambulatory Visit (INDEPENDENT_AMBULATORY_CARE_PROVIDER_SITE_OTHER): Payer: Medicare Other | Admitting: Family Medicine

## 2014-03-17 VITALS — BP 148/68 | HR 72 | Temp 98.2°F | Wt 133.5 lb

## 2014-03-17 DIAGNOSIS — R634 Abnormal weight loss: Secondary | ICD-10-CM

## 2014-03-17 NOTE — Progress Notes (Signed)
Pre visit review using our clinic review tool, if applicable. No additional management support is needed unless otherwise documented below in the visit note. Patient states he doesn't know if he is taking "all of these medicines because I don't know what they are for".    We reviewed his med list.  He was off lasix and K since he hasn't needed them.  We ran his other meds on his list, discussed all of them, ie their purposes.  He is off vitamins and his med list is updated.  He has been off his BP meds for a few days.   Weight is down.  Diet has been off, d/w pt.   He can get groceries but he doesn't want to cook.  We talked about him trying to get some help.   He's thinking about staying with his son, locally.  His brother and his sister in law has offered to have him stay with them.   No vomiting, no diarrhea, no rash, no new focal changes.  No cough, no fevers.  No ST.  He is fatigued.   He admits his mood is low.  No SI/HI.    Meds, vitals, and allergies reviewed.   ROS: See HPI.  Otherwise, noncontributory.  GEN: nad, alert and oriented HEENT: mucous membranes moist NECK: supple w/o LA CV: rrr.  no murmur PULM: ctab, no inc wob, obvious scoliosis noted.  ABD: soft, +bs EXT: no edema SKIN: no acute rash

## 2014-03-17 NOTE — Patient Instructions (Signed)
Stay off the amlodipine and let me know if you are spending time with your brother or son.  Try to get 3 meals in per day.  Stop the amlodipine for now.  Restart metoprolol.  Take care.  Update me as needed.

## 2014-03-18 NOTE — Assessment & Plan Note (Signed)
Looks to be mood and diet/calorie related.  D/w pt about moving in with others, at least splitting time, and he is receptive.  He'll look into that.  Reviewed all meds, tried to simplify his list.  Stay off amlodipine for now, restart BB and he'll update me as needed.  He agrees.

## 2014-04-03 ENCOUNTER — Ambulatory Visit (INDEPENDENT_AMBULATORY_CARE_PROVIDER_SITE_OTHER): Payer: Medicare Other | Admitting: Cardiovascular Disease

## 2014-04-03 ENCOUNTER — Ambulatory Visit (HOSPITAL_COMMUNITY): Payer: Medicare Other | Attending: Internal Medicine

## 2014-04-03 ENCOUNTER — Encounter: Payer: Self-pay | Admitting: Cardiovascular Disease

## 2014-04-03 VITALS — BP 134/56 | HR 75 | Ht 70.0 in | Wt 134.0 lb

## 2014-04-03 DIAGNOSIS — Z954 Presence of other heart-valve replacement: Secondary | ICD-10-CM | POA: Diagnosis present

## 2014-04-03 DIAGNOSIS — Z952 Presence of prosthetic heart valve: Secondary | ICD-10-CM

## 2014-04-03 DIAGNOSIS — I359 Nonrheumatic aortic valve disorder, unspecified: Secondary | ICD-10-CM

## 2014-04-03 MED ORDER — CLOPIDOGREL BISULFATE 75 MG PO TABS: 75.0000 mg | ORAL_TABLET | Freq: Every day | ORAL | Status: DC

## 2014-04-03 NOTE — Progress Notes (Signed)
2D Echo completed. 04/03/2014

## 2014-04-03 NOTE — Patient Instructions (Addendum)
Start Plavix 75mg  daily. You should take this through May 2016--6 months after your TAVR.  Your physician recommends that you schedule a follow-up appointment in: 3 months with Dr Rockey Situ.  Your physician wants you to follow-up in: 1 year with Dr Burt Knack in the [facility].  You will receive a reminder letter in the mail two months in advance. If you don't receive a letter, please call our office to schedule the follow-up appointment.

## 2014-04-03 NOTE — Progress Notes (Signed)
Cardiology Office Note   Date:  04/03/2014   ID:  Douglas Nest Casstevens Sr., DOB Mar 27, 1927, MRN [id number]  PCP:  Elsie Stain, MD  Cardiologist:  Dr Rockey Situ    Chief Complaint  Patient presents with  . Cardiac Valve Problem     History of Present Illness: Douglas Nest Schmaltz Sr. is a 81 y.o. male who presents for follow-up of aortic valve disease. The patient developed severe symptomatic aortic stenosis and he underwent TAVR 02/03/2014 with a 26 mm Sapien 3 transcatheter heart valve. His hospital course was uncomplicated. He was discharged home November 20.  Overall he is doing ok. He's had a few dizzy spells that occurred with postural changes. No syncope. He denies chest pain, shortness of breath, orthopnea, PND, or edema. He continues to have chronic back pain related to his severe scoliosis. No change in this since the time of his surgery. No other complaints today.  Past Medical History  Diagnosis Date  . Hyperlipidemia 09/05/95  . Diverticulosis of colon 06/05/2002  . Benign prostatic hypertrophy 10/18/00  . Hypertension 05/19/03  . CHF (congestive heart failure) 04/21/03  . Scoliosis   . Colon polyps 06/05/2002    path could not be found   . Chronic diastolic congestive heart failure   . Aortic valve stenosis 09/10/2012  . Back pain   . Arthritis   . Cancer     skin cancers  . Thinning of skin     bruises easy.  . 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 Knee arthroscopy  06/1997    right  . Colonoscopy w/ biopsies  05/2002  . Flexible sigmoidoscopy  07/2005  . Cardiac catheterization  10/27/2013  . Tee without cardioversion    . Tonsillectomy    . Eye surgery      bilateral cataracts  . Transcatheter aortic valve replacement, transfemoral N/A 02/03/2014    Procedure: TRANSCATHETER AORTIC VALVE  REPLACEMENT, TRANSFEMORAL;  Surgeon: Sherren Mocha, MD;  Location: [place];  Service: Open Heart Surgery;  Laterality: N/A;  . Intraoperative transesophageal echocardiogram N/A 02/03/2014    Procedure: INTRAOPERATIVE TRANSESOPHAGEAL ECHOCARDIOGRAM;  Surgeon: Sherren Mocha, MD;  Location: [facility] OR;  Service: Open Heart Surgery;  Laterality: N/A;    Current Outpatient Prescriptions  Medication Sig Dispense Refill  . aspirin 81 MG EC tablet Take 81 mg by mouth daily.      Marland Kitchen atorvastatin (LIPITOR) 20 MG tablet Take 1 tablet (20 mg total) by mouth at bedtime. 30 tablet 12  . finasteride (PROSCAR) 5 MG tablet Take 5 mg by mouth daily.    . furosemide (LASIX) 20 MG tablet Take 1 tablet (20 mg total) by mouth daily as needed for edema. 30 tablet 1  . metoprolol tartrate (LOPRESSOR) 25 MG tablet Take 1 tablet (25 mg total) by mouth 2 (two) times daily. 60 tablet 6  . Oxycodone HCl 10 MG TABS Take 5-10 mg by mouth 3 (three) times daily as needed (for pain).     . pantoprazole (PROTONIX) 40 MG tablet Take 40 mg by mouth daily.    . potassium chloride SA (K-DUR,KLOR-CON) 20 MEQ tablet Take 1 tablet (20 mEq total) by mouth daily. 30 tablet 5  . sennosides-docusate sodium (SENOKOT-S) 8.6-50 MG tablet Take 1 tablet by mouth 2 (two) times daily.    Dorette Grate Z  0.004 % SOLN ophthalmic solution Place 1 drop into the right eye at bedtime.      No current facility-administered medications for this visit.    Allergies:   Review of patient's allergies indicates no known allergies.   Social History:  The patient  reports that he quit smoking about 43 years ago. His smoking use included Cigarettes. He has a 20 pack-year smoking history. He has quit using smokeless tobacco. He reports that he does not drink alcohol or use illicit drugs.   Family History:  The patient's  family history includes Coronary artery disease in his brother; Diabetes in his brother and brother; Hip fracture in his sister; Hypertension in his  other; Stroke in his father. There is no history of Colon cancer or Prostate cancer.    ROS:  Please see the history of present illness.  Otherwise, review of systems is positive for decreased appetite, visual disturbance, back pain, easy bruising, constipation, headaches.  All other systems are reviewed and negative.    PHYSICAL EXAM: VS:  BP 134/56 mmHg  Pulse 75  Ht 5\' 10"  (1.778 m)  Wt 134 lb (60.782 kg)  BMI 19.23 kg/m2  SpO2 98% , BMI Body mass index is 19.23 kg/(m^2). GEN: Elderly male in no acute distress HEENT: normal Neck: no JVD, carotid bruits, or masses Cardiac: RRR with a soft grade 1/6 systolic ejection murmur at the left lower sternal border,no edema  Respiratory:  clear to auscultation bilaterally, normal work of breathing GI: soft, nontender, nondistended, + BS MS: Severe kyphoscoliosis  Skin: warm and dry, no rash Neuro:  Strength and sensation are intact Psych: euthymic mood, full affect  EKG:  EKG is not ordered today.  Recent Labs: 06/25/2013: TSH 0.50 01/30/2014: ALT 12 02/04/2014: Magnesium 1.9 02/16/2014: BUN 18; Creatinine 0.8; Hemoglobin 10.8*; Platelets 163; Potassium 4.2; Sodium 138   Lipid Panel     Component Value Date/Time   CHOL 170 02/27/2013 0810   TRIG 42.0 02/27/2013 0810   HDL 72.90 02/27/2013 0810   CHOLHDL 2 02/27/2013 0810   VLDL 8.4 02/27/2013 0810   LDLCALC 89 02/27/2013 0810      Wt Readings from Last 3 Encounters:  04/03/14 134 lb (60.782 kg)  03/17/14 133 lb 8 oz (60.555 kg)  02/25/14 147 lb 8 oz (66.906 kg)     Other studies Reviewed: 2-D echocardiogram  Study Conclusions  - Left ventricle: The cavity size was normal. Wall thickness was normal. Systolic function was normal. The estimated ejection fraction was in the range of 60% to 65%. Wall motion was normal; there were no regional wall motion abnormalities. Doppler parameters are consistent with abnormal left ventricular relaxation (grade 1 diastolic  dysfunction). The E/e&' ratio is between 8-15, suggesting indeterminate LV Filling pressure. - Aortic valve: s/p TAVR. Peak and mean gradients of 10 and 4 mmHg. Trivial perivalvular leak is noted. - Mitral valve: Calcified annulus. There was trivial regurgitation. - Left atrium: The atrium was at the upper limits of normal in size. - Tricuspid valve: There was no significant regurgitation. - Inferior vena cava: The vessel was normal in size. The respirophasic diameter changes were in the normal range (>= 50%), consistent with normal central venous pressure.  Impressions:  - Compared to the prior echo in 01/2014, there is no longer any significant perivalvular regurgitation. AVR gradients are stable.   ASSESSMENT AND PLAN: Aortic valve disease status post TAVR. The patient has had a good recovery from surgery. His echo was personally reviewed and  shows normal LV systolic function, normal transaortic valve gradients, and trivial paravalvular aortic insufficiency. I have also reviewed his medication list and for some reason he is not taking Plavix. He was discharged from the hospital on this medication. I've asked him to resume this at a dose of 75 mg daily to be taken for a total of 6 months from the time of his TAVR procedure. He otherwise appears to be stable. He is tolerating recent changes in blood pressure medicines made by Dr. Damita Dunnings where his amlodipine was stopped and metoprolol was restarted. I've asked him to follow-up with Dr. Rockey Situ in 3 months and I will plan on seeing him back for his 12 month heart valve clinic visit.   Current medicines are reviewed with the patient today.  The patient does not have concerns regarding medicines.  The following changes have been made:  Resume clopidogrel 75 mg daily  Labs/ tests ordered today include: echo completed. no lab orders.  No orders of the defined types were placed in this encounter.    Disposition:   FU with Dr Rockey Situ  3 months. FU with me in Valve Clinic 12 months with an echo (this will be arranged).  Signed, Sherren Mocha, MD  04/03/2014 11:00 AM    [facility] [place], [place], [place]  [address] Phone: [phone]; Fax: [phone]

## 2014-05-05 ENCOUNTER — Encounter: Payer: Self-pay | Admitting: Family Medicine

## 2014-05-05 ENCOUNTER — Ambulatory Visit (INDEPENDENT_AMBULATORY_CARE_PROVIDER_SITE_OTHER): Payer: Medicare Other | Admitting: Family Medicine

## 2014-05-05 VITALS — BP 120/60 | HR 75 | Temp 97.8°F | Wt 138.5 lb

## 2014-05-05 DIAGNOSIS — I5032 Chronic diastolic (congestive) heart failure: Secondary | ICD-10-CM

## 2014-05-05 DIAGNOSIS — M546 Pain in thoracic spine: Secondary | ICD-10-CM

## 2014-05-05 DIAGNOSIS — E785 Hyperlipidemia, unspecified: Secondary | ICD-10-CM

## 2014-05-05 DIAGNOSIS — I10 Essential (primary) hypertension: Secondary | ICD-10-CM

## 2014-05-05 LAB — LIPID PANEL
CHOLESTEROL: 126 mg/dL (ref 0–200)
HDL: 49.8 mg/dL (ref >39.00)
LDL CALC: 66 mg/dL (ref 0–99)
NonHDL: 76.2
Total CHOL/HDL Ratio: 3
Triglycerides: 51 mg/dL (ref 0.0–149.0)
VLDL: 10.2 mg/dL (ref 0.0–40.0)

## 2014-05-05 MED ORDER — OXYCODONE HCL 10 MG PO TABS: 10.0000 mg | ORAL_TABLET | Freq: Three times a day (TID) | ORAL | Status: DC

## 2014-05-05 MED ORDER — PANTOPRAZOLE SODIUM 40 MG PO TBEC: 40.0000 mg | DELAYED_RELEASE_TABLET | Freq: Every day | ORAL | Status: DC

## 2014-05-05 MED ORDER — ATORVASTATIN CALCIUM 20 MG PO TABS: 20.0000 mg | ORAL_TABLET | Freq: Every day | ORAL | Status: DC

## 2014-05-05 MED ORDER — OXYCODONE HCL 10 MG PO TABS: 10.0000 mg | ORAL_TABLET | Freq: Three times a day (TID) | ORAL | Status: DC | PRN

## 2014-05-05 NOTE — Assessment & Plan Note (Signed)
Okay to try 1.5 tabs of oxycodone instead of 1 tab per dose.  D/w pt about routine cautions.  He agrees.

## 2014-05-05 NOTE — Assessment & Plan Note (Signed)
Much improved s/p TAVR w/o lasix use recently.

## 2014-05-05 NOTE — Progress Notes (Signed)
Pre visit review using our clinic review tool, if applicable. No additional management support is needed unless otherwise documented below in the visit note.  Back pain continues, worsening overall.  No ADE on oxycodone, didn't get relief with muscle relaxer trial prev.  We talked about his dosing on oxycodone.   HLD.  Due for f/u lipids.  Fasting.   Compliant with statin.  No ADE.   Prev weight loss.  D/w pt about diet and getting enough calories in throughout the day.  He is working on that.  Slightly inc in weight but no inc in BLE edema.   He is possibly off metoprolol.  He'll check on that.  He stopped amlodipine as prev discussed.   PMH and SH reviewed  ROS: See HPI, otherwise noncontributory.  Meds, vitals, and allergies reviewed.   nad ncat Mmm Neck supple Obvious scoliosis Rrr, no murmur heard ctab abd soft Ext w/o edema, improved from prev (prior to TAVR) and this is w/o recent lasix use.

## 2014-05-05 NOTE — Assessment & Plan Note (Signed)
Continue statin, see notes on labs.

## 2014-05-05 NOTE — Assessment & Plan Note (Signed)
Controlled, he'll check on his BB use.  >25 minutes spent in face to face time with patient, >50% spent in counselling or coordination of care.

## 2014-05-05 NOTE — Patient Instructions (Signed)
Check to make sure you are still taking metoprolol.  Let me know one way or the other.   Go to the lab on the way out.  We'll contact you with your lab report. Try taking an extra half tab of oxycodone as needed.  Let me know if that isn't helping the back pain.  Take care.  Glad to see you.

## 2014-05-21 ENCOUNTER — Other Ambulatory Visit: Payer: Self-pay | Admitting: Family Medicine

## 2014-06-18 ENCOUNTER — Ambulatory Visit (INDEPENDENT_AMBULATORY_CARE_PROVIDER_SITE_OTHER): Payer: Medicare Other | Admitting: Family Medicine

## 2014-06-18 ENCOUNTER — Encounter: Payer: Self-pay | Admitting: Family Medicine

## 2014-06-18 VITALS — BP 142/68 | HR 78 | Temp 98.5°F | Wt 141.2 lb

## 2014-06-18 DIAGNOSIS — R5383 Other fatigue: Secondary | ICD-10-CM | POA: Diagnosis not present

## 2014-06-18 NOTE — Progress Notes (Signed)
Pre visit review using our clinic review tool, if applicable. No additional management support is needed unless otherwise documented below in the visit note.  Not needing lasix/K now.  Trace edema noted by patient.   Still with fatigue.  He'll get SOB if out in the heat working.  Doesn't happen at rest or in cool air.  No wheeze.  No cough.  No sputum.  No fevers.  No CP.  He likely has a MSK component with his scoliosis affecting his pulmonary capacity, d/w pt.   Back pain continues, on opiates at baseline.  No ADE on med.    Living at home.  He signed up with the senior program for a lunch daily.  He has groceries, "I just need somebody to cook for me."  His diet situation is improved with the meal program.   Meds, vitals, and allergies reviewed.   ROS: See HPI.  Otherwise, noncontributory.  nad ncat Mmm Neck supple Obvious scoliosis Rrr, no murmur heard ctab abd soft Ext w/ trace edema

## 2014-06-18 NOTE — Patient Instructions (Signed)
Go to the lab on the way out.  We'll contact you with your lab report. We'll see if the labs reveal anything about your fatigue.  We'll go from there.  Take care.  Glad to see you.

## 2014-06-19 DIAGNOSIS — R5383 Other fatigue: Secondary | ICD-10-CM | POA: Insufficient documentation

## 2014-06-19 LAB — CBC WITH DIFFERENTIAL/PLATELET
BASOS PCT: 0.5 % (ref 0.0–3.0)
Basophils Absolute: 0 10*3/uL (ref 0.0–0.1)
Eosinophils Absolute: 0.1 10*3/uL (ref 0.0–0.7)
Eosinophils Relative: 1.7 % (ref 0.0–5.0)
HCT: 31.8 % — ABNORMAL LOW (ref 39.0–52.0)
Hemoglobin: 10.9 g/dL — ABNORMAL LOW (ref 13.0–17.0)
LYMPHS ABS: 0.9 10*3/uL (ref 0.7–4.0)
LYMPHS PCT: 21 % (ref 12.0–46.0)
MCHC: 34.4 g/dL (ref 30.0–36.0)
MCV: 90.1 fl (ref 78.0–100.0)
MONOS PCT: 7 % (ref 3.0–12.0)
Monocytes Absolute: 0.3 10*3/uL (ref 0.1–1.0)
Neutro Abs: 3.1 10*3/uL (ref 1.4–7.7)
Neutrophils Relative %: 69.8 % (ref 43.0–77.0)
Platelets: 146 10*3/uL — ABNORMAL LOW (ref 150.0–400.0)
RBC: 3.54 Mil/uL — AB (ref 4.22–5.81)
RDW: 13.6 % (ref 11.5–15.5)
WBC: 4.5 10*3/uL (ref 4.0–10.5)

## 2014-06-19 LAB — COMPREHENSIVE METABOLIC PANEL
ALBUMIN: 3.6 g/dL (ref 3.5–5.2)
ALT: 14 U/L (ref 0–53)
AST: 16 U/L (ref 0–37)
Alkaline Phosphatase: 54 U/L (ref 39–117)
BILIRUBIN TOTAL: 0.6 mg/dL (ref 0.2–1.2)
BUN: 24 mg/dL — ABNORMAL HIGH (ref 6–23)
CHLORIDE: 103 meq/L (ref 96–112)
CO2: 35 mEq/L — ABNORMAL HIGH (ref 19–32)
Calcium: 9.1 mg/dL (ref 8.4–10.5)
Creatinine, Ser: 0.95 mg/dL (ref 0.40–1.50)
GFR: 80.04 mL/min (ref >60.00)
GLUCOSE: 84 mg/dL (ref 70–99)
POTASSIUM: 4.2 meq/L (ref 3.5–5.1)
SODIUM: 138 meq/L (ref 135–145)
TOTAL PROTEIN: 6.2 g/dL (ref 6.0–8.3)

## 2014-06-19 LAB — TSH: TSH: 0.8 u[IU]/mL (ref 0.35–4.50)

## 2014-06-19 NOTE — Assessment & Plan Note (Signed)
Check basic labs today.  Likely multifactorial, with MSK component and pain contributing.  Wouldn't change his meds at this point, other than add back lasix and K prn.  He agrees.

## 2014-06-25 ENCOUNTER — Encounter: Payer: Self-pay | Admitting: *Deleted

## 2014-07-03 ENCOUNTER — Ambulatory Visit: Payer: Medicare Other | Admitting: Cardiovascular Disease

## 2014-07-29 ENCOUNTER — Ambulatory Visit (INDEPENDENT_AMBULATORY_CARE_PROVIDER_SITE_OTHER): Payer: Medicare Other | Admitting: Cardiovascular Disease

## 2014-07-29 ENCOUNTER — Encounter: Payer: Self-pay | Admitting: Cardiovascular Disease

## 2014-07-29 VITALS — BP 118/60 | HR 72 | Ht 69.0 in | Wt 135.5 lb

## 2014-07-29 DIAGNOSIS — Z952 Presence of prosthetic heart valve: Secondary | ICD-10-CM

## 2014-07-29 DIAGNOSIS — Z954 Presence of other heart-valve replacement: Secondary | ICD-10-CM

## 2014-07-29 DIAGNOSIS — I5032 Chronic diastolic (congestive) heart failure: Secondary | ICD-10-CM

## 2014-07-29 DIAGNOSIS — I10 Essential (primary) hypertension: Secondary | ICD-10-CM

## 2014-07-29 DIAGNOSIS — I350 Nonrheumatic aortic (valve) stenosis: Secondary | ICD-10-CM

## 2014-07-29 DIAGNOSIS — E785 Hyperlipidemia, unspecified: Secondary | ICD-10-CM

## 2014-07-29 DIAGNOSIS — R634 Abnormal weight loss: Secondary | ICD-10-CM

## 2014-07-29 MED ORDER — POTASSIUM CHLORIDE CRYS ER 20 MEQ PO TBCR: 20.0000 meq | EXTENDED_RELEASE_TABLET | Freq: Every day | ORAL | Status: DC | PRN

## 2014-07-29 NOTE — Assessment & Plan Note (Signed)
He appears euvolemic on today's visit. Not eating or drinking much. He is taking Lasix with potassium only as needed

## 2014-07-29 NOTE — Assessment & Plan Note (Signed)
Seems to be doing well following valve replacement. Weight loss, fatigue likely unrelated to any cardiac issue

## 2014-07-29 NOTE — Assessment & Plan Note (Signed)
Blood pressure adequate. He is not taking metoprolol or amlodipine. No new medications needed

## 2014-07-29 NOTE — Assessment & Plan Note (Signed)
No changes made to his medications. With recent weight loss, I suspect his cholesterol will be running lower and Lipitor could be cut in half

## 2014-07-29 NOTE — Assessment & Plan Note (Signed)
My fears that he is spending most of his money on animal food for his numerous cats and dogs, less money on his own food. Stressed importance study take care of himself

## 2014-07-29 NOTE — Patient Instructions (Addendum)
You are doing well. No medication changes were made.  Please start an iron pill once to twice a day for anemia/low blood count  Try to keep your weight up  Please call us if you have new issues that need to be addressed before your next appt.  Your physician wants you to follow-up in: 12 months.  You will receive a reminder letter in the mail two months in advance. If you don't receive a letter, please call our office to schedule the follow-up appointment.

## 2014-07-29 NOTE — Progress Notes (Signed)
Patient ID: Douglas Boyden Sr., male    DOB: 05-Jan-1928, 81 y.o.   MRN: [id number]  HPI Comments: [name] is a 81 year old gentleman with no known coronary artery disease, history of hypertension, hyperlipidemia, chronic mild shortness of breath,  aortic valve stenosis ,  who underwent TAVR 02/03/14 who presents for routine followup of his prosthetic aortic valve. He has severe scoliosis and kyphosis TEE performed October 2014 showing moderate to severe aortic valve stenosis, estimated aortic valve area 1.2 cm  follow-up echocardiogram following TAVR showing no significant gradient.  In follow-up today, he reports that he has no energy, has periods of nausea, malaise. Reports that he is sleeping well, not eating as he should. Weight is down from 144 pounds down to 135.8 pounds since his last clinic visit in September 2015. He has numerous animals at home. Sometimes spends more money on their food in his own. Review of blood work with him shows chronic baseline anemia. Unclear if he has Iron deficiency He is unclear if he has noticed a dramatic difference since aortic valve replacement. Denies having shortness of breath with exertion.  He is limited in his ability to ambulate by his back pathology  He was previously on amlodipine, metoprolol. Now not taking either of these.  only taking Lasix and potassium as needed  EKG on today's visit shows normal sinus rhythm with rate 72 bpm, no significant ST or T-wave changes  Other past medical history   cardiac catheterization on 11/13/2013 that showed right dominant coronary system, possibly codominant with mild luminal irregularities, mild to moderate proximal LAD disease  Previous Echocardiogram in 2014 showed severe aortic valve stenosis, severe calcification of the valve with restriction of the leaflets. Mean gradient of 41 mm of mercury, peak gradient 80 mm of mercury  Previous Total cholesterol 154, LDL 84   Echocardiogram  from April 2010 showed normal systolic function, minimal aortic valve stenosis, mildly dilated left atrium, mild TR. Carotid arterial Doppler done last year showed mild plaquing less than 39% bilaterally. Last stress test was 3 years ago with no ischemia. He exercised for 5 minutes, 30 seconds. Achieved 13 METS   No Known Allergies  Current Outpatient Prescriptions on File Prior to Visit  Medication Sig Dispense Refill  . aspirin 81 MG EC tablet Take 81 mg by mouth daily.      Marland Kitchen atorvastatin (LIPITOR) 20 MG tablet Take 1 tablet (20 mg total) by mouth at bedtime. 30 tablet 12  . clopidogrel (PLAVIX) 75 MG tablet Take 1 tablet (75 mg total) by mouth daily. 30 tablet 4  . finasteride (PROSCAR) 5 MG tablet TAKE 1 TABLET BY MOUTH ONCE A DAY 30 tablet 11  . furosemide (LASIX) 20 MG tablet Take 1 tablet (20 mg total) by mouth daily as needed for edema. 30 tablet 1  . Oxycodone HCl 10 MG TABS Take 1-1.5 tablets (10-15 mg total) by mouth 3 (three) times daily. 135 tablet 0  . pantoprazole (PROTONIX) 40 MG tablet Take 1 tablet (40 mg total) by mouth daily. 30 tablet 12  . TRAVATAN Z 0.004 % SOLN ophthalmic solution Place 1 drop into the right eye at bedtime.      No current facility-administered medications on file prior to visit.    Past Medical History  Diagnosis Date  . Hyperlipidemia 09/05/95  . Diverticulosis of colon 06/05/2002  . Benign prostatic hypertrophy 10/18/00  . Hypertension 05/19/03  . CHF (congestive heart failure) 04/21/03  . Scoliosis   .  Colon polyps 06/05/2002    path could not be found   . Chronic diastolic congestive heart failure   . Aortic valve stenosis 09/10/2012  . Back pain   . Arthritis   . Cancer     skin cancers  . Thinning of skin     bruises easy.  . 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  Knee arthroscopy  06/1997    right  . Colonoscopy w/ biopsies  05/2002  . Flexible sigmoidoscopy  07/2005  . Cardiac catheterization  10/27/2013  . Tee without cardioversion    . Tonsillectomy    . Eye surgery      bilateral cataracts  . Transcatheter aortic valve replacement, transfemoral N/A 02/03/2014    Procedure: TRANSCATHETER AORTIC VALVE REPLACEMENT, TRANSFEMORAL;  Surgeon: Sherren Mocha, MD;  Location: [place];  Service: Open Heart Surgery;  Laterality: N/A;  . Intraoperative transesophageal echocardiogram N/A 02/03/2014    Procedure: INTRAOPERATIVE TRANSESOPHAGEAL ECHOCARDIOGRAM;  Surgeon: Sherren Mocha, MD;  Location: [facility] OR;  Service: Open Heart Surgery;  Laterality: N/A;    Social History  reports that he quit smoking about 43 years ago. His smoking use included Cigarettes. He has a 20 pack-year smoking history. He has quit using smokeless tobacco. He reports that he does not drink alcohol or use illicit drugs.  Family History family history includes Coronary artery disease in his brother; Diabetes in his brother and brother; Hip fracture in his sister; Hypertension in his other; Stroke in his father. There is no history of Colon cancer or Prostate cancer.  Review of Systems  Constitutional: Positive for fatigue.  Respiratory: Negative.   Cardiovascular: Negative.   Gastrointestinal: Negative.   Musculoskeletal: Positive for back pain.  Skin: Negative.   Neurological: Positive for weakness.  Hematological: Negative.   Psychiatric/Behavioral: Negative.   All other systems reviewed and are negative.   BP 118/60 mmHg  Pulse 72  Ht 5\' 9"  (1.753 m)  Wt 135 lb 8 oz (61.462 kg)  BMI 20.00 kg/m2  Physical Exam  Constitutional: He is oriented to person, place, and time. He appears well-developed and well-nourished.  Severe curvature of his back from scoliosis, kyphosis  HENT:  Head: Normocephalic.  Nose: Nose normal.  Mouth/Throat: Oropharynx is clear and moist.  Eyes:  Conjunctivae are normal. Pupils are equal, round, and reactive to light.  Neck: Normal range of motion. Neck supple. No JVD present. Carotid bruit is present.  Cardiovascular: Normal rate, regular rhythm, S1 normal, S2 normal and intact distal pulses.  Exam reveals no gallop and no friction rub.   Murmur heard.  Crescendo systolic murmur is present with a grade of 1/6  Murmur radiating up into his carotids bilaterally  Pulmonary/Chest: Effort normal and breath sounds normal. No respiratory distress. He has no wheezes. He has no rales. He exhibits no tenderness.  Abdominal: Soft. Bowel sounds are normal. He exhibits no distension. There is no tenderness.  Musculoskeletal: Normal range of motion. He exhibits no edema or tenderness.  Lymphadenopathy:    He has no cervical adenopathy.  Neurological: He is alert and oriented to person, place, and time. Coordination normal.  Skin: Skin is warm and dry. No rash noted. No erythema.  Psychiatric: He has a normal mood and affect. His behavior is normal. Judgment and thought content normal.  Assessment and Plan   Nursing note and vitals reviewed.

## 2014-08-31 ENCOUNTER — Ambulatory Visit (INDEPENDENT_AMBULATORY_CARE_PROVIDER_SITE_OTHER): Payer: Medicare Other | Admitting: Family Medicine

## 2014-08-31 ENCOUNTER — Encounter: Payer: Self-pay | Admitting: Family Medicine

## 2014-08-31 VITALS — BP 110/50 | HR 73 | Temp 98.5°F | Wt 137.2 lb

## 2014-08-31 DIAGNOSIS — Z23 Encounter for immunization: Secondary | ICD-10-CM

## 2014-08-31 DIAGNOSIS — S81812A Laceration without foreign body, left lower leg, initial encounter: Secondary | ICD-10-CM | POA: Diagnosis not present

## 2014-08-31 DIAGNOSIS — S81819A Laceration without foreign body, unspecified lower leg, initial encounter: Secondary | ICD-10-CM | POA: Insufficient documentation

## 2014-08-31 MED ORDER — FERROUS SULFATE 325 (65 FE) MG PO TABS: 325.0000 mg | ORAL_TABLET | Freq: Every day | ORAL | Status: DC

## 2014-08-31 NOTE — Progress Notes (Signed)
Pre visit review using our clinic review tool, if applicable. No additional management support is needed unless otherwise documented below in the visit note.  Chainsaw slid off bench and hit his L leg 3 days ago.  Glancing blow.  Wasn't painful.  Bled at the time, controlled with pressure.    Meds, vitals, and allergies reviewed.   ROS: See HPI.  Otherwise, noncontributory.  nad L medial shin, 2.5cm vertical lac, shallow with skin flap.  Not red.  No ttp, no bleeding.  Good tissue apposition.

## 2014-08-31 NOTE — Patient Instructions (Signed)
You don't need stitches.  Keep it clean and covered.  If pain or spreading redness, then update me.  Neosporin and a bandage daily.  Wash gently with soapy water but don't scrub hard.  Take care.  Glad to see you.

## 2014-08-31 NOTE — Assessment & Plan Note (Signed)
L medial shin, 2.5cm vertical lac, shallow with skin flap. Not red. No ttp, no bleeding. Good tissue apposition.  Td updated. Should heal.  See AVS.  F/u prn.  Covered with bandaid and neosporin.

## 2014-11-10 ENCOUNTER — Ambulatory Visit (INDEPENDENT_AMBULATORY_CARE_PROVIDER_SITE_OTHER): Payer: Medicare Other | Admitting: Family Medicine

## 2014-11-10 ENCOUNTER — Encounter: Payer: Self-pay | Admitting: Family Medicine

## 2014-11-10 VITALS — BP 110/50 | HR 72 | Temp 97.9°F | Wt 139.5 lb

## 2014-11-10 DIAGNOSIS — R110 Nausea: Secondary | ICD-10-CM

## 2014-11-10 LAB — COMPREHENSIVE METABOLIC PANEL
ALK PHOS: 55 U/L (ref 39–117)
ALT: 10 U/L (ref 0–53)
AST: 13 U/L (ref 0–37)
Albumin: 3.6 g/dL (ref 3.5–5.2)
BILIRUBIN TOTAL: 0.6 mg/dL (ref 0.2–1.2)
BUN: 26 mg/dL — AB (ref 6–23)
CO2: 34 meq/L — AB (ref 19–32)
Calcium: 9 mg/dL (ref 8.4–10.5)
Chloride: 103 mEq/L (ref 96–112)
Creatinine, Ser: 1.03 mg/dL (ref 0.40–1.50)
GFR: 72.84 mL/min (ref >60.00)
GLUCOSE: 99 mg/dL (ref 70–99)
Potassium: 4.2 mEq/L (ref 3.5–5.1)
SODIUM: 140 meq/L (ref 135–145)
TOTAL PROTEIN: 6.1 g/dL (ref 6.0–8.3)

## 2014-11-10 LAB — CBC WITH DIFFERENTIAL/PLATELET
Basophils Absolute: 0 10*3/uL (ref 0.0–0.1)
Basophils Relative: 0.5 % (ref 0.0–3.0)
Eosinophils Absolute: 0.1 10*3/uL (ref 0.0–0.7)
Eosinophils Relative: 1.3 % (ref 0.0–5.0)
HCT: 33.5 % — ABNORMAL LOW (ref 39.0–52.0)
Hemoglobin: 11.2 g/dL — ABNORMAL LOW (ref 13.0–17.0)
Lymphocytes Relative: 20.8 % (ref 12.0–46.0)
Lymphs Abs: 1 10*3/uL (ref 0.7–4.0)
MCHC: 33.5 g/dL (ref 30.0–36.0)
MCV: 90.1 fl (ref 78.0–100.0)
MONO ABS: 0.3 10*3/uL (ref 0.1–1.0)
Monocytes Relative: 6.5 % (ref 3.0–12.0)
NEUTROS ABS: 3.4 10*3/uL (ref 1.4–7.7)
Neutrophils Relative %: 70.9 % (ref 43.0–77.0)
PLATELETS: 147 10*3/uL — AB (ref 150.0–400.0)
RBC: 3.71 Mil/uL — AB (ref 4.22–5.81)
RDW: 13.7 % (ref 11.5–15.5)
WBC: 4.8 10*3/uL (ref 4.0–10.5)

## 2014-11-10 LAB — LIPASE: LIPASE: 32 U/L (ref 11.0–59.0)

## 2014-11-10 NOTE — Progress Notes (Signed)
Pre visit review using our clinic review tool, if applicable. No additional management support is needed unless otherwise documented below in the visit note.  "I'm not doing the way I think I ought to."  He has tried to use a push mower but had more back pain the next day.  "I was sore all over."  Scoliosis known, at baseline.  Chronic pain at baseline. He didn't get SOB with the mowing.  Occ mild BLE edema.  Some occ nausea w/o vomiting.  Weight is up slightly.  No blood in stools.  Breathing is "pretty good."  No recent lasix use.    Meds, vitals, and allergies reviewed.   ROS: See HPI.  Otherwise, noncontributory.  nad ncat Mmm Neck supple, no LA rrr Chronic changes in BS due to posture noted, no inc in WOB Obvious scoliosis noted in back abd soft, not ttp, no rebound Ext w/o edema

## 2014-11-10 NOTE — Patient Instructions (Signed)
Don't change your meds for now.  Go to the lab on the way out.  We'll contact you with your lab report.

## 2014-11-11 DIAGNOSIS — R110 Nausea: Secondary | ICD-10-CM | POA: Insufficient documentation

## 2014-11-11 NOTE — Assessment & Plan Note (Signed)
Unclear how much is related to chronic pain, chronic pain med use.  Would check basic labs today as patient has baseline/unremarkable exam.  He agrees.  He'll update me as needed.  No ominous cause seen.

## 2014-11-12 ENCOUNTER — Other Ambulatory Visit: Payer: Self-pay | Admitting: Family Medicine

## 2014-11-12 ENCOUNTER — Telehealth: Payer: Self-pay | Admitting: Family Medicine

## 2014-11-12 NOTE — Telephone Encounter (Signed)
See result note.  

## 2014-11-12 NOTE — Telephone Encounter (Signed)
Received refill request electronically from pharmacy. Medication is not on medication list. Called and spoke to the pharmacist and was advised that he has tried to get this from his eye doctor, but has not heard back from him. Patient told pharmacist to send it to Dr. Damita Dunnings. Was advised by pharmacist that this is for glaucoma. Advised pharmacist that he should get this from the eye doctor since he follows him for glaucoma.  Pharmacist stated that he will continue to try to get the refill from the ophthalmologist.

## 2014-11-12 NOTE — Telephone Encounter (Signed)
Patient returned call about his lab work.  Please call patient.

## 2014-11-13 ENCOUNTER — Other Ambulatory Visit: Payer: Self-pay

## 2014-11-13 NOTE — Telephone Encounter (Signed)
Agreed, needs to come through the eye clinic.  Thanks.

## 2014-12-04 ENCOUNTER — Ambulatory Visit (INDEPENDENT_AMBULATORY_CARE_PROVIDER_SITE_OTHER): Payer: Medicare Other

## 2014-12-04 DIAGNOSIS — Z23 Encounter for immunization: Secondary | ICD-10-CM | POA: Diagnosis not present

## 2015-01-11 ENCOUNTER — Encounter: Payer: Self-pay | Admitting: Family Medicine

## 2015-01-11 ENCOUNTER — Ambulatory Visit (INDEPENDENT_AMBULATORY_CARE_PROVIDER_SITE_OTHER): Payer: Medicare Other | Admitting: Family Medicine

## 2015-01-11 VITALS — BP 110/62 | HR 72 | Temp 98.5°F | Wt 137.8 lb

## 2015-01-11 DIAGNOSIS — M4120 Other idiopathic scoliosis, site unspecified: Secondary | ICD-10-CM | POA: Diagnosis not present

## 2015-01-11 MED ORDER — OXYCODONE HCL 10 MG PO TABS: 10.0000 mg | ORAL_TABLET | Freq: Three times a day (TID) | ORAL | Status: DC | PRN

## 2015-01-11 NOTE — Patient Instructions (Signed)
Use the oxycodone as needed for now.  take care.  Glad to see you.

## 2015-01-11 NOTE — Progress Notes (Signed)
Pre visit review using our clinic review tool, if applicable. No additional management support is needed unless otherwise documented below in the visit note.  Back pain.  Continues, episodically.  We talked about patch use, but he may not be a good candidate given his occ low level of pain and thus risk for sedation.  Some constipation.  Corrected with miralax.  He can occ get comfortable in a chair and the pain will lessen.  The main painful area is L lower back.   Meds, vitals, and allergies reviewed.   ROS: See HPI.  Otherwise, noncontributory.  nad Mmm Neck supple Obvious severe scoliosis rrr Ctab, accounting for chest wall/thorax deformity abd soft Ext w/o edema

## 2015-01-12 NOTE — Assessment & Plan Note (Signed)
It seems that continued prn opiates are the best option for pain control.  No ADE other than corrected constipation and he can adjust med to his pain level.  He agree.  rx done.  Continue as is.  Usually taking BID, can use tid if needed for severe pain.  Okay for outpatient f/u.

## 2015-04-12 ENCOUNTER — Encounter: Payer: Self-pay | Admitting: Cardiovascular Disease

## 2015-04-20 ENCOUNTER — Ambulatory Visit (INDEPENDENT_AMBULATORY_CARE_PROVIDER_SITE_OTHER): Payer: Medicare Other | Admitting: Family Medicine

## 2015-04-20 ENCOUNTER — Encounter (INDEPENDENT_AMBULATORY_CARE_PROVIDER_SITE_OTHER): Payer: Self-pay

## 2015-04-20 ENCOUNTER — Encounter: Payer: Self-pay | Admitting: Family Medicine

## 2015-04-20 VITALS — BP 106/50 | HR 51 | Temp 98.4°F | Wt 141.5 lb

## 2015-04-20 DIAGNOSIS — M546 Pain in thoracic spine: Secondary | ICD-10-CM

## 2015-04-20 DIAGNOSIS — R590 Localized enlarged lymph nodes: Secondary | ICD-10-CM | POA: Insufficient documentation

## 2015-04-20 MED ORDER — OXYCODONE HCL 10 MG PO TABS: 10.0000 mg | ORAL_TABLET | Freq: Three times a day (TID) | ORAL | Status: DC | PRN

## 2015-04-20 NOTE — Assessment & Plan Note (Signed)
Single lesion, resolving, small, no other signs of systemic illness. Okay for outpatient f/u.  Likely an incidental finding.  D/w pt. He'll update me as needed.

## 2015-04-20 NOTE — Progress Notes (Signed)
Pre visit review using our clinic review tool, if applicable. No additional management support is needed unless otherwise documented below in the visit note.  Last few days with a lump on the L side if the neck, he thought it was a swollen gland, deep to the skin, ie not a superficial lesion.  Sore if touched, not sore o/w.  Less puffy now, clearly better per patient. No fevers.  No sputum.  No oral lesion.  No lesion on the R side of the neck.  Was incidentally noted.  Swallowing okay.  No oral pain.   Still with chronic pain from his back.  No ADE on opiates.  Some relief from the pain with med, though occ doses are inadequate.  Able to deal with constipation with dietary measures.    Meds, vitals, and allergies reviewed.   ROS: See HPI.  Otherwise, noncontributory.  nad ncat Tm wnl B Nasal exam slightly stuffy OP wnl Neck supple. No LA except for 1 small LN on the L upper side of the neck inferior the L ear, not ttp, still <<1cm.  rrr ctab Chronic postural changes noted in the back, obvious scoliosis.

## 2015-04-20 NOTE — Patient Instructions (Signed)
It looks like you have a small resolving lymph node on the left side of your neck.  It should gradually go away.  If you have other troubles then let me know.  Use the pain medicine as needed.   Take care.  Glad to see you.

## 2015-04-20 NOTE — Assessment & Plan Note (Signed)
Continue prn oxycodone, with 10mg  per dose, could inc to 15mg  episodically if needed.  D/w pt.  Still averaging less than 30mg  a day, based on his report and fill record.  Routine cautions given.  He agrees.

## 2015-05-06 ENCOUNTER — Other Ambulatory Visit: Payer: Self-pay | Admitting: *Deleted

## 2015-05-06 MED ORDER — ATORVASTATIN CALCIUM 20 MG PO TABS: 20.0000 mg | ORAL_TABLET | Freq: Every day | ORAL | Status: DC

## 2015-05-07 ENCOUNTER — Other Ambulatory Visit: Payer: Self-pay | Admitting: *Deleted

## 2015-05-07 MED ORDER — ATORVASTATIN CALCIUM 20 MG PO TABS: 20.0000 mg | ORAL_TABLET | Freq: Every day | ORAL | Status: DC

## 2015-05-14 ENCOUNTER — Other Ambulatory Visit (HOSPITAL_COMMUNITY): Payer: Medicare Other

## 2015-05-14 ENCOUNTER — Ambulatory Visit: Payer: Medicare Other | Admitting: Cardiovascular Disease

## 2015-06-09 ENCOUNTER — Encounter: Payer: Self-pay | Admitting: Family Medicine

## 2015-06-09 ENCOUNTER — Ambulatory Visit (INDEPENDENT_AMBULATORY_CARE_PROVIDER_SITE_OTHER): Payer: Medicare Other | Admitting: Family Medicine

## 2015-06-09 VITALS — BP 102/60 | HR 78 | Temp 98.5°F | Wt 140.0 lb

## 2015-06-09 DIAGNOSIS — R5383 Other fatigue: Secondary | ICD-10-CM

## 2015-06-09 LAB — TSH: TSH: 0.88 u[IU]/mL (ref 0.35–4.50)

## 2015-06-09 LAB — IBC PANEL
IRON: 68 ug/dL (ref 42–165)
Saturation Ratios: 26.4 % (ref 20.0–50.0)
Transferrin: 184 mg/dL — ABNORMAL LOW (ref 212.0–360.0)

## 2015-06-09 LAB — CBC WITH DIFFERENTIAL/PLATELET
BASOS ABS: 0 10*3/uL (ref 0.0–0.1)
Basophils Relative: 0.5 % (ref 0.0–3.0)
EOS ABS: 0.1 10*3/uL (ref 0.0–0.7)
Eosinophils Relative: 2.3 % (ref 0.0–5.0)
HEMATOCRIT: 32.8 % — AB (ref 39.0–52.0)
HEMOGLOBIN: 11.1 g/dL — AB (ref 13.0–17.0)
LYMPHS PCT: 25.7 % (ref 12.0–46.0)
Lymphs Abs: 1.3 10*3/uL (ref 0.7–4.0)
MCHC: 33.9 g/dL (ref 30.0–36.0)
MCV: 89.2 fl (ref 78.0–100.0)
MONO ABS: 0.4 10*3/uL (ref 0.1–1.0)
Monocytes Relative: 7.2 % (ref 3.0–12.0)
Neutro Abs: 3.2 10*3/uL (ref 1.4–7.7)
Neutrophils Relative %: 64.3 % (ref 43.0–77.0)
Platelets: 146 10*3/uL — ABNORMAL LOW (ref 150.0–400.0)
RBC: 3.68 Mil/uL — ABNORMAL LOW (ref 4.22–5.81)
RDW: 13.9 % (ref 11.5–15.5)
WBC: 5 10*3/uL (ref 4.0–10.5)

## 2015-06-09 LAB — COMPREHENSIVE METABOLIC PANEL
ALBUMIN: 3.7 g/dL (ref 3.5–5.2)
ALT: 14 U/L (ref 0–53)
AST: 14 U/L (ref 0–37)
Alkaline Phosphatase: 53 U/L (ref 39–117)
BILIRUBIN TOTAL: 0.5 mg/dL (ref 0.2–1.2)
BUN: 34 mg/dL — AB (ref 6–23)
CALCIUM: 8.9 mg/dL (ref 8.4–10.5)
CHLORIDE: 107 meq/L (ref 96–112)
CO2: 32 meq/L (ref 19–32)
CREATININE: 1.21 mg/dL (ref 0.40–1.50)
GFR: 60.4 mL/min (ref >60.00)
Glucose, Bld: 118 mg/dL — ABNORMAL HIGH (ref 70–99)
Potassium: 4.3 mEq/L (ref 3.5–5.1)
SODIUM: 142 meq/L (ref 135–145)
Total Protein: 6.2 g/dL (ref 6.0–8.3)

## 2015-06-09 MED ORDER — OXYCODONE HCL 10 MG PO TABS: 10.0000 mg | ORAL_TABLET | Freq: Three times a day (TID) | ORAL | Status: DC | PRN

## 2015-06-09 NOTE — Patient Instructions (Addendum)
Go to the lab on the way out.  We'll contact you with your lab report, especially about the iron.   Try a heating pad on your lower back.  That may help some, more than ice.   Take care.  Glad to see you.

## 2015-06-09 NOTE — Progress Notes (Signed)
Pre visit review using our clinic review tool, if applicable. No additional management support is needed unless otherwise documented below in the visit note.  Has been off iron for a while but not a clearly defined period per patient.    "No energy."  His mood is okay but "I'm just shot".  Sx noted in the last week or so.  No FCNAVD.  No rash.  Pain is chronic, some days worse than others, worse with weather changes.  Pain meds help some.  Weight is stable.  Eating fairly well except skipping some breakfasts, d/w pt.  Not SOB.  No CP.  Not taking lasix often.  Not much swelling.  Mood is "in the middle."  He is able to smile during the interview today.   He has some L lower back pain, occ radiates to the R side.  Better sitting and laying down, worse standing.  No trauma.  Chronic scoliosis noted.    Meds, vitals, and allergies reviewed.   ROS: See HPI.  Otherwise, noncontributory.  GEN: nad, alert and oriented HEENT: mucous membranes moist NECK: supple w/o LA CV: rrr.  Soft murmur noted.  PULM: ctab, no inc wob ABD: soft, +bs EXT: trace BLE edema SKIN: no acute rash Back with chronic scoliosis noted.  L lower back ttp but normal ROM at the hips.  No cva pain.

## 2015-06-13 NOTE — Assessment & Plan Note (Signed)
Likely multifactorial.  D/w pt.  Pain, pain meds, chronic medical issues all likely contribute.  Has been off iron for a while but not a clearly defined period per patient.  Recheck basic labs, see notes on AVS.  He agrees.   It is likely not a good idea to change his pain meds at this point, as he can get some relief with the meds and can change his dose (by skipping a dose on a "good" day) as needed.

## 2015-06-15 ENCOUNTER — Encounter: Payer: Self-pay | Admitting: *Deleted

## 2015-06-22 ENCOUNTER — Other Ambulatory Visit: Payer: Self-pay | Admitting: Cardiovascular Disease

## 2015-06-22 DIAGNOSIS — Z952 Presence of prosthetic heart valve: Secondary | ICD-10-CM

## 2015-06-25 ENCOUNTER — Ambulatory Visit (HOSPITAL_COMMUNITY): Payer: Medicare Other | Attending: Internal Medicine

## 2015-06-25 ENCOUNTER — Ambulatory Visit (INDEPENDENT_AMBULATORY_CARE_PROVIDER_SITE_OTHER): Payer: Medicare Other | Admitting: Cardiovascular Disease

## 2015-06-25 ENCOUNTER — Encounter: Payer: Self-pay | Admitting: Cardiovascular Disease

## 2015-06-25 ENCOUNTER — Other Ambulatory Visit: Payer: Self-pay

## 2015-06-25 VITALS — BP 180/70 | HR 70 | Ht 70.0 in | Wt 142.4 lb

## 2015-06-25 DIAGNOSIS — I110 Hypertensive heart disease with heart failure: Secondary | ICD-10-CM | POA: Diagnosis not present

## 2015-06-25 DIAGNOSIS — I350 Nonrheumatic aortic (valve) stenosis: Secondary | ICD-10-CM

## 2015-06-25 DIAGNOSIS — Z954 Presence of other heart-valve replacement: Secondary | ICD-10-CM

## 2015-06-25 DIAGNOSIS — I059 Rheumatic mitral valve disease, unspecified: Secondary | ICD-10-CM | POA: Insufficient documentation

## 2015-06-25 DIAGNOSIS — Z87891 Personal history of nicotine dependence: Secondary | ICD-10-CM | POA: Diagnosis not present

## 2015-06-25 DIAGNOSIS — Z952 Presence of prosthetic heart valve: Secondary | ICD-10-CM

## 2015-06-25 DIAGNOSIS — E785 Hyperlipidemia, unspecified: Secondary | ICD-10-CM | POA: Insufficient documentation

## 2015-06-25 DIAGNOSIS — I509 Heart failure, unspecified: Secondary | ICD-10-CM | POA: Insufficient documentation

## 2015-06-25 NOTE — Progress Notes (Signed)
Cardiology Office Note Date:  06/25/2015   ID:  Douglas Nest Aveni Sr., DOB 1927/09/11, MRN [id number]  PCP:  Elsie Stain, MD  Cardiologist:  Sherren Mocha, MD    Chief Complaint  Patient presents with  . Aortic Stenosis    c/o back pain. denies chest pain, sob, le edema, or claudication  . Hypertension   History of Present Illness: Douglas Nest Villers Sr. is a 82 y.o. male who presents for  Follow-up of aortic valve disease. The patient underwent TAVR 02/03/2014 with a 26 mm Sapien 3 transcatheter heart valve. He had an uncomplicated hospital course.  The patient presents today for his 1 year follow-up visit. He's had to reschedule appointments and has had some trouble getting over here. He did have his echocardiogram done this morning is a preliminary report was mean gradient is 12 mmHg is no paravalvular leak. The patient continues to complain of fatigue. He denies chest pain or shortness of breath. He hasn't been able to gain weight, but his weight loss has stabilized and his weight is unchanged over the last year. He denies leg swelling, orthopnea, or PND. He's had some bruising on aspirin and Plavix but no major bleeding problems.  Past Medical History  Diagnosis Date  . Hyperlipidemia 09/05/95  . Diverticulosis of colon 06/05/2002  . Benign prostatic hypertrophy 10/18/00  . Hypertension 05/19/03  . CHF (congestive heart failure) ([place]) 04/21/03  . Scoliosis   . Colon polyps 06/05/2002    path could not be found   . Chronic diastolic congestive heart failure ([place])   . Aortic valve stenosis 09/10/2012  . Back pain   . Arthritis   . Cancer ([place])     skin cancers  . Thinning of skin     bruises easy.  . 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 Knee arthroscopy  06/1997    right  . Colonoscopy w/ biopsies  05/2002    . Flexible sigmoidoscopy  07/2005  . Cardiac catheterization  10/27/2013  . Tee without cardioversion    . Tonsillectomy    . Eye surgery      bilateral cataracts  . Transcatheter aortic valve replacement, transfemoral N/A 02/03/2014    Procedure: TRANSCATHETER AORTIC VALVE REPLACEMENT, TRANSFEMORAL;  Surgeon: Sherren Mocha, MD;  Location: [facility];  Service: Open Heart Surgery;  Laterality: N/A;  . Intraoperative transesophageal echocardiogram N/A 02/03/2014    Procedure: INTRAOPERATIVE TRANSESOPHAGEAL ECHOCARDIOGRAM;  Surgeon: Sherren Mocha, MD;  [facility];  Service: Open Heart Surgery;  Laterality: N/A;    Current Outpatient Prescriptions  Medication Sig Dispense Refill  . aspirin 81 MG EC tablet Take 81 mg by mouth daily.      Marland Kitchen atorvastatin (LIPITOR) 20 MG tablet Take 1 tablet (20 mg total) by mouth at bedtime. 30 tablet 12  . ferrous sulfate ([employer] IRON) 325 (65 FE) MG tablet Take 1 tablet (325 mg total) by mouth daily with breakfast.    . finasteride (PROSCAR) 5 MG tablet TAKE 1 TABLET BY MOUTH ONCE A DAY 30 tablet 11  . furosemide (LASIX) 20 MG tablet Take 1 tablet (20 mg total) by mouth daily as needed for edema. 30 tablet 1  . Oxycodone HCl 10 MG TABS Take 10 mg by mouth 3 (three) times daily as needed (pain).    . pantoprazole (PROTONIX)  40 MG tablet Take 1 tablet (40 mg total) by mouth daily. 30 tablet 12  . potassium chloride SA (K-DUR,KLOR-CON) 20 MEQ tablet Take 1 tablet (20 mEq total) by mouth daily as needed. 30 tablet 5  . TRAVATAN Z 0.004 % SOLN ophthalmic solution Place 1 drop into the right eye at bedtime.      No current facility-administered medications for this visit.    Allergies:   Review of patient's allergies indicates no known allergies.   Social History:  The patient  reports that he quit smoking about 44 years ago. His smoking use included Cigarettes. He has a 20 pack-year smoking history. He has quit using smokeless tobacco. He reports that he does not  drink alcohol or use illicit drugs.   Family History:  The patient's  family history includes Coronary artery disease in his brother; Diabetes in his brother and brother; Hip fracture in his sister; Hypertension in his other; Stroke in his father. There is no history of Colon cancer or Prostate cancer.   ROS:  Please see the history of present illness.  Otherwise, review of systems is positive for  Back pain.  All other systems are reviewed and negative.   PHYSICAL EXAM: VS:  BP 180/70 mmHg  Pulse 70  Ht 5\' 10"  (1.778 m)  Wt 142 lb 6.4 oz (64.592 kg)  BMI 20.43 kg/m2 , BMI Body mass index is 20.43 kg/(m^2).   GEN:  Pleasant elderly male, thin, in no acute distress HEENT: normal Neck: no JVD, no masses.  bilateral carotid bruits Cardiac: RRR with  Grade 2/6 systolic ejection murmur at the left sternal border, no diastolic murmur              Respiratory:  clear to auscultation bilaterally, normal work of breathing GI: soft, nontender, nondistended, + BS MS: no deformity or atrophy Ext: no pretibial edema Skin: warm and dry, no rash Neuro:  Strength and sensation are intact Psych: euthymic mood, full affect  EKG:  EKG is ordered today. The ekg ordered today shows  Normal sinus rhythm 70 bpm, within normal limits.  Recent Labs: 06/09/2015: ALT 14; BUN 34*; Creatinine, Ser 1.21; Hemoglobin 11.1*; Platelets 146.0*; Potassium 4.3; Sodium 142; TSH 0.88   Lipid Panel     Component Value Date/Time   CHOL 126 05/05/2014 1303   TRIG 51.0 05/05/2014 1303   HDL 49.80 05/05/2014 1303   CHOLHDL 3 05/05/2014 1303   VLDL 10.2 05/05/2014 1303   LDLCALC 66 05/05/2014 1303    Wt Readings from Last 3 Encounters:  06/25/15 142 lb 6.4 oz (64.592 kg)  06/09/15 140 lb (63.504 kg)  04/20/15 141 lb 8 oz (64.184 kg)    Cardiac Studies Reviewed:  2-D echocardiogram pending  ASSESSMENT AND PLAN: 1.  Aortic valve disease one year out from TAVR: patient with NYHA II symptoms, primarily limited by  generalized fatigue. I suspect all this is multifactorial and primarily driven by his poor nutritional status. The patient is now one year out from TAVR and I recommended that he stop clopidogrel. He will continue on aspirin 81 mg. He will follow-up with Dr. Rockey Situ from here forward. I will review his echocardiogram when the study is formally interpreted. He is reminded of need for regular dental visits and SBE prophylaxis.   2. Essential hypertension: Initially his blood pressure was markedly elevated. After he had been sitting, I rechecked his blood pressure and it is 130/70. Will continue observation.  Current medicines are reviewed with the  patient today.  The patient does not have concerns regarding medicines.  Labs/ tests ordered today include:   Orders Placed This Encounter  Procedures  . EKG 12-Lead   Disposition:   FU with Dr Rockey Situ as scheduled.  Deatra James, MD  06/25/2015 1:08 PM    [facility] [address], [place], [place]  [address] Phone: [phone]; Fax: [phone]

## 2015-06-25 NOTE — Patient Instructions (Addendum)
Medication Instructions:  Your physician has recommended you make the following change in your medication:  1. STOP Plavix  Labwork: No new orders.   Testing/Procedures: No new orders.   Follow-Up: Your physician recommends that you schedule a follow-up appointment in: May with Dr Rockey Situ   Any Other Special Instructions Will Be Listed Below (If Applicable).  Your physician discussed the importance of taking an antibiotic prior to any dental, gastrointestinal, genitourinary procedures to prevent damage to the heart valves from infection.     If you need a refill on your cardiac medications before your next appointment, please call your pharmacy.

## 2015-06-29 ENCOUNTER — Encounter: Payer: Self-pay | Admitting: Cardiovascular Disease

## 2015-06-29 NOTE — Telephone Encounter (Signed)
New message   Pt is calling for ECHO results

## 2015-06-29 NOTE — Telephone Encounter (Signed)
This encounter was created in error - please disregard.

## 2015-07-21 ENCOUNTER — Telehealth: Payer: Self-pay | Admitting: Family Medicine

## 2015-07-21 NOTE — Telephone Encounter (Signed)
[facility] Patient Name: Douglas Parker DOB: [birth year] Initial Comment caller states his right leg and ankle is hurts - states it is sore and red and itching Nurse Assessment Nurse: Dimas Chyle, RN, Dellis Filbert Date/Time Eilene Ghazi Time): 07/21/2015 8:49:41 AM Confirm and document reason for call. If symptomatic, describe symptoms. You must click the next button to save text entered. ---Caller states his right leg and ankle is hurts - states it is sore and red and itching. Symptoms started on Saturday. No fever. Has the patient traveled out of the country within the last 30 days? ---No Does the patient have any new or worsening symptoms? ---Yes Will a triage be completed? ---Yes Related visit to physician within the last 2 weeks? ---No Does the PT have any chronic conditions? (i.e. diabetes, asthma, etc.) ---No Is this a behavioral health or substance abuse call? ---No Guidelines Guideline Title Affirmed Question Affirmed Notes Rash or Redness - Localized [1] Localized rash is very painful AND [2] no fever Final Disposition User See Physician within 24 Hours [place], RN, Dellis Filbert Comments Caller reports that he already has appointment scheduled with PCP for 07/22/15. Referrals REFERRED TO PCP OFFICE Disagree/Comply: Comply

## 2015-07-21 NOTE — Telephone Encounter (Signed)
Pt has appt 07/22/15 at 12:15 with Dr Damita Dunnings.

## 2015-07-21 NOTE — Telephone Encounter (Signed)
Noted. Thanks.

## 2015-07-22 ENCOUNTER — Encounter: Payer: Self-pay | Admitting: Family Medicine

## 2015-07-22 ENCOUNTER — Ambulatory Visit (INDEPENDENT_AMBULATORY_CARE_PROVIDER_SITE_OTHER): Payer: Medicare Other | Admitting: Family Medicine

## 2015-07-22 VITALS — BP 122/52 | HR 78 | Temp 97.7°F | Wt 142.8 lb

## 2015-07-22 DIAGNOSIS — Q845 Enlarged and hypertrophic nails: Secondary | ICD-10-CM | POA: Diagnosis not present

## 2015-07-22 DIAGNOSIS — L602 Onychogryphosis: Secondary | ICD-10-CM | POA: Diagnosis not present

## 2015-07-22 DIAGNOSIS — R609 Edema, unspecified: Secondary | ICD-10-CM

## 2015-07-22 MED ORDER — FUROSEMIDE 20 MG PO TABS: 20.0000 mg | ORAL_TABLET | Freq: Every day | ORAL | Status: DC | PRN

## 2015-07-22 MED ORDER — POTASSIUM CHLORIDE CRYS ER 20 MEQ PO TBCR: 20.0000 meq | EXTENDED_RELEASE_TABLET | Freq: Every day | ORAL | Status: DC | PRN

## 2015-07-22 NOTE — Assessment & Plan Note (Signed)
Restart lasix with potassium for the next few days until the swelling is better. Then stop both.  Can restart them when needed for swelling.  I think the skin irritation will get better when the swelling is better.  If any new rash or fevers, then he'll let me know. No need for abx at this point with the tick bite.  D/w pt.  He agrees.  Refer to foot clinic re: nail trimming.

## 2015-07-22 NOTE — Progress Notes (Signed)
Pre visit review using our clinic review tool, if applicable. No additional management support is needed unless otherwise documented below in the visit note.  Off lasix and K recently, for at least the last month per patient report.  B leg swelling.  Tick on R shin since yesterday.  Some discomfort on R shin, not on the calf.  No fevers.  No other rash.    Meds, vitals, and allergies reviewed.   ROS: Per HPI unless specifically indicated in ROS section   nad ncat rrr Ctab accounting for scoliosis at baseline abd soft 1+ BLE edema with chronic changes on the shins B but calf not ttp B Normal R DP pulse, thick nails noted.  Tick on R shin noted, not engorged, removed completely with tweezers, no complication.

## 2015-07-22 NOTE — Patient Instructions (Signed)
Take the fluid pill (lasix) along with the potassium for the next few days until the swelling is better.  Then stop both.   You can restart them when needed for swelling.   I think the skin irritation will get better when the swelling is better.  If you have any rash or fevers, then let me know.  It doesn't look like you need anything else for the tick bite.  Take care.  Glad to see you.

## 2015-07-30 ENCOUNTER — Ambulatory Visit (INDEPENDENT_AMBULATORY_CARE_PROVIDER_SITE_OTHER): Payer: Medicare Other | Admitting: Cardiovascular Disease

## 2015-07-30 ENCOUNTER — Encounter: Payer: Self-pay | Admitting: Cardiovascular Disease

## 2015-07-30 VITALS — BP 140/72 | HR 69 | Ht 70.0 in | Wt 140.5 lb

## 2015-07-30 DIAGNOSIS — I5032 Chronic diastolic (congestive) heart failure: Secondary | ICD-10-CM | POA: Diagnosis not present

## 2015-07-30 DIAGNOSIS — I10 Essential (primary) hypertension: Secondary | ICD-10-CM | POA: Diagnosis not present

## 2015-07-30 DIAGNOSIS — I350 Nonrheumatic aortic (valve) stenosis: Secondary | ICD-10-CM

## 2015-07-30 NOTE — Progress Notes (Signed)
Patient ID: Douglas Boyden Sr., male    DOB: [birth year], 82 y.o.   MRN: [id number]  HPI Comments: Douglas Parker is an 82 year old gentleman with no known coronary artery disease, history of hypertension, hyperlipidemia, chronic mild shortness of breath,  Severe aortic valve stenosis ,  who underwent TAVR 02/03/14 who presents for routine followup of his prosthetic aortic valve. He has severe scoliosis and kyphosis TEE performed October 2014 showing moderate to severe aortic valve stenosis, estimated aortic valve area 1.2 cm  follow-up echocardiogram following TAVR showing no significant gradient.  In follow-up, he reports that his back is giving him problems, kyphosis, scoliosis, chronic pain Unable to walk very far secondary to his back Weight is up 5 pounds from his prior clinic visit Reports that he is not taking Lasix, denies any significant leg edema Lab work reviewed with him showing hematocrit 33, creatinine 1.2, BUN 34  EKG on today's visit shows normal sinus rhythm with rate 69 bpm, no significant ST or T-wave changes  Other past medical history He has numerous animals at home. Sometimes spends more money on their food in his own. Review of blood work with him shows chronic baseline anemia.   He was previously on amlodipine, metoprolol. Now not taking either of these. He stopped these on his own on a prior office visit   cardiac catheterization on 11/13/2013 that showed right dominant coronary system, possibly codominant with mild luminal irregularities, mild to moderate proximal LAD disease  Previous Echocardiogram in 2014 showed severe aortic valve stenosis, severe calcification of the valve with restriction of the leaflets. Mean gradient of 41 mm of mercury, peak gradient 80 mm of mercury  Previous Total cholesterol 154, LDL 84   Echocardiogram from April 2010 showed normal systolic function, minimal aortic valve stenosis, mildly dilated left atrium, mild TR. Carotid  arterial Doppler done last year showed mild plaquing less than 39% bilaterally. Last stress test was 3 years ago with no ischemia. He exercised for 5 minutes, 30 seconds. Achieved 13 METS   No Known Allergies  Current Outpatient Prescriptions on File Prior to Visit  Medication Sig Dispense Refill  . aspirin 81 MG EC tablet Take 81 mg by mouth daily.      Marland Kitchen atorvastatin (LIPITOR) 20 MG tablet Take 1 tablet (20 mg total) by mouth at bedtime. 30 tablet 12  . ferrous sulfate ([employer] IRON) 325 (65 FE) MG tablet Take 1 tablet (325 mg total) by mouth daily with breakfast.    . finasteride (PROSCAR) 5 MG tablet TAKE 1 TABLET BY MOUTH ONCE A DAY 30 tablet 11  . furosemide (LASIX) 20 MG tablet Take 1 tablet (20 mg total) by mouth daily as needed (for swelling.  take with potassium). 30 tablet 1  . Oxycodone HCl 10 MG TABS Take 10 mg by mouth 3 (three) times daily as needed (pain).    . pantoprazole (PROTONIX) 40 MG tablet Take 1 tablet (40 mg total) by mouth daily. 30 tablet 12  . potassium chloride SA (K-DUR,KLOR-CON) 20 MEQ tablet Take 1 tablet (20 mEq total) by mouth daily as needed (with lasix). 30 tablet 1  . TRAVATAN Z 0.004 % SOLN ophthalmic solution Place 1 drop into the right eye at bedtime.      No current facility-administered medications on file prior to visit.    Past Medical History  Diagnosis Date  . Hyperlipidemia 09/05/95  . Diverticulosis of colon 06/05/2002  . Benign prostatic hypertrophy 10/18/00  . Hypertension 05/19/03  .  CHF (congestive heart failure) ([place]) 04/21/03  . Scoliosis   . Colon polyps 06/05/2002    path could not be found   . Chronic diastolic congestive heart failure ([place])   . Aortic valve stenosis 09/10/2012  . Back pain   . Arthritis   . Cancer ([place])     skin cancers  . Thinning of skin     bruises easy.  . 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 Knee arthroscopy  06/1997    right  . Colonoscopy w/ biopsies  05/2002  . Flexible sigmoidoscopy  07/2005  . Cardiac catheterization  10/27/2013  . Tee without cardioversion    . Tonsillectomy    . Eye surgery      bilateral cataracts  . Transcatheter aortic valve replacement, transfemoral N/A 02/03/2014    Procedure: TRANSCATHETER AORTIC VALVE REPLACEMENT, TRANSFEMORAL;  Surgeon: Sherren Mocha, MD;  Location: [facility];  Service: Open Heart Surgery;  Laterality: N/A;  . Intraoperative transesophageal echocardiogram N/A 02/03/2014    Procedure: INTRAOPERATIVE TRANSESOPHAGEAL ECHOCARDIOGRAM;  Surgeon: Sherren Mocha, MD;  Location: [facility] OR;  Service: Open Heart Surgery;  Laterality: N/A;    Social History  reports that he quit smoking about 44 years ago. His smoking use included Cigarettes. He has a 20 pack-year smoking history. He has quit using smokeless tobacco. He reports that he does not drink alcohol or use illicit drugs.  Family History family history includes Coronary artery disease in his brother; Diabetes in his brother and brother; Hip fracture in his sister; Hypertension in his other; Stroke in his father. There is no history of Colon cancer or Prostate cancer.  Review of Systems  Constitutional: Positive for fatigue.  Respiratory: Negative.   Cardiovascular: Negative.   Gastrointestinal: Negative.   Musculoskeletal: Positive for back pain.  Skin: Negative.   Neurological: Positive for weakness.  Hematological: Negative.   Psychiatric/Behavioral: Negative.   All other systems reviewed and are negative.   BP 140/72 mmHg  Pulse 69  Ht 5\' 10"  (1.778 m)  Wt 140 lb 8 oz (63.73 kg)  BMI 20.16 kg/m2  Physical Exam  Constitutional: He is oriented to person, place, and time. He appears well-developed and well-nourished.  Severe curvature of his back from scoliosis, kyphosis  HENT:  Head: Normocephalic.  Nose: Nose  normal.  Mouth/Throat: Oropharynx is clear and moist.  Eyes: Conjunctivae are normal. Pupils are equal, round, and reactive to light.  Neck: Normal range of motion. Neck supple. No JVD present. Carotid bruit is present.  Cardiovascular: Normal rate, regular rhythm, S1 normal, S2 normal and intact distal pulses.  Exam reveals no gallop and no friction rub.   Murmur heard.  Crescendo systolic murmur is present with a grade of 1/6  Murmur radiating up into his carotids bilaterally  Pulmonary/Chest: Effort normal and breath sounds normal. No respiratory distress. He has no wheezes. He has no rales. He exhibits no tenderness.  Abdominal: Soft. Bowel sounds are normal. He exhibits no distension. There is no tenderness.  Musculoskeletal: Normal range of motion. He exhibits no edema or tenderness.  Lymphadenopathy:    He has no cervical adenopathy.  Neurological: He is alert and oriented to person, place, and time. Coordination normal.  Skin: Skin is warm and dry. No rash noted. No erythema.  Psychiatric: He has a normal mood and  affect. His behavior is normal. Judgment and thought content normal.      Assessment and Plan   Nursing note and vitals reviewed.

## 2015-07-30 NOTE — Assessment & Plan Note (Signed)
Blood pressure is well controlled on today's visit. No changes made to the medications. 

## 2015-07-30 NOTE — Assessment & Plan Note (Signed)
Appears euvolemic, currently not on Lasix Symptoms improved since his valve surgery

## 2015-07-30 NOTE — Patient Instructions (Signed)
You are doing well. No medication changes were made.  Take lasix and potassium only as needed for significant leg swelling  Please call us if you have new issues that need to be addressed before your next appt.  Your physician wants you to follow-up in: 6 months.  You will receive a reminder letter in the mail two months in advance. If you don't receive a letter, please call our office to schedule the follow-up appointment.

## 2015-07-30 NOTE — Assessment & Plan Note (Signed)
S/p TAVR Appears to be doing well, main complaint is arthritic, back pain We will perform periodic echocardiogram to evaluate valve

## 2015-08-12 ENCOUNTER — Ambulatory Visit (INDEPENDENT_AMBULATORY_CARE_PROVIDER_SITE_OTHER): Payer: Medicare Other | Admitting: Podiatry

## 2015-08-12 ENCOUNTER — Encounter: Payer: Self-pay | Admitting: Podiatry

## 2015-08-12 DIAGNOSIS — B351 Tinea unguium: Secondary | ICD-10-CM

## 2015-08-12 DIAGNOSIS — M79674 Pain in right toe(s): Secondary | ICD-10-CM

## 2015-08-12 DIAGNOSIS — M79675 Pain in left toe(s): Secondary | ICD-10-CM

## 2015-08-12 NOTE — Progress Notes (Signed)
Subjective:     Patient ID: Douglas Boyden Sr., male   DOB: 1928/01/16, 82 y.o.   MRN: [id number]  HPI 82 year old male presents the office they for concerns of thick, painful, elongated toenails that he cannot trim himself. They're painful particularly shoes and pressure. No surrounding redness or drainage. No other complaints today. No claudication symptoms.  Review of Systems  All other systems reviewed and are negative.      Objective:   Physical Exam General: AAO x3, NAD  Dermatological: Nails are hypertrophic, dystrophic, brittle, discolored, elongated 10. No surrounding redness or drainage. Tenderness nails 1-5 bilaterally. There is dry skin on the plantar aspect of the foot. There is no open lesion or pre-ulcerative lesions otherwise.  Vascular: Dorsalis Pedis artery and Posterior Tibial artery pedal pulses are 1/4 bilateral with immedate capillary fill time. Varicose veins are present. Mild chronic ankle edema is present. There is no pain with calf compression, swelling, warmth, erythema.   Neruologic: Sensation intact with Derrel Nip monofilament.  Musculoskeletal: There is no area pinpoint bony tenderness is no pain vibratory sensation. No pain, crepitus, or limitation noted with foot and ankle range of motion bilateral. Muscular strength 5/5 in all groups tested bilateral.  Gait: Unassisted, Nonantalgic.      Assessment:     82 year old male with symptomatic onychomycosis    Plan:     -Treatment options discussed including all alternatives, risks, and complications -Etiology of symptoms were discussed -Nails debrided 10 without complications or bleeding. -Daily foot inspection -Follow-up in 3 months or sooner if any problems arise. In the meantime, encouraged to call the office with any questions, concerns, change in symptoms.   Celesta Gentile, DPM

## 2015-09-08 ENCOUNTER — Other Ambulatory Visit: Payer: Self-pay | Admitting: Family Medicine

## 2015-09-08 NOTE — Telephone Encounter (Signed)
Pt left note requesting rx oxycodone. Call when ready for pick up. Last printed # 90 on 06/09/15(on hx med list), Gerald Stabs at [facility] verified the last time filled was #90 on 06/09/15. Last seen for back pain and pain med discussed 06/09/15.

## 2015-09-09 NOTE — Telephone Encounter (Signed)
Message left for patient to return my call.  

## 2015-09-09 NOTE — Telephone Encounter (Signed)
Patient called back. Notified patient that Rx is ready for pick up. Left in front office.

## 2015-10-07 ENCOUNTER — Ambulatory Visit (INDEPENDENT_AMBULATORY_CARE_PROVIDER_SITE_OTHER): Payer: Medicare Other | Admitting: Family Medicine

## 2015-10-07 ENCOUNTER — Ambulatory Visit (INDEPENDENT_AMBULATORY_CARE_PROVIDER_SITE_OTHER): Payer: Medicare Other

## 2015-10-07 ENCOUNTER — Encounter: Payer: Self-pay | Admitting: Family Medicine

## 2015-10-07 VITALS — BP 142/54 | HR 72 | Temp 98.3°F | Wt 139.2 lb

## 2015-10-07 DIAGNOSIS — Z23 Encounter for immunization: Secondary | ICD-10-CM

## 2015-10-07 DIAGNOSIS — N400 Enlarged prostate without lower urinary tract symptoms: Secondary | ICD-10-CM

## 2015-10-07 DIAGNOSIS — M4120 Other idiopathic scoliosis, site unspecified: Secondary | ICD-10-CM | POA: Diagnosis not present

## 2015-10-07 DIAGNOSIS — Z0000 Encounter for general adult medical examination without abnormal findings: Secondary | ICD-10-CM | POA: Diagnosis not present

## 2015-10-07 MED ORDER — FINASTERIDE 5 MG PO TABS: 5.0000 mg | ORAL_TABLET | Freq: Every day | ORAL | Status: AC

## 2015-10-07 MED ORDER — GABAPENTIN 100 MG PO CAPS: 100.0000 mg | ORAL_CAPSULE | Freq: Three times a day (TID) | ORAL | Status: AC

## 2015-10-07 MED ORDER — OXYCODONE HCL 10 MG PO TABS: 10.0000 mg | ORAL_TABLET | Freq: Three times a day (TID) | ORAL | Status: DC | PRN

## 2015-10-07 NOTE — Progress Notes (Signed)
PCP notes:  Health maintenance:  PCV13 - administered Shingles - postponed/will discuss with PCP considering hx of shingles  Abnormal screenings:  Hearing - failed Fall risk - hx of fall without injury Mini-Cog: 19/20  Patient concerns: None  Nurse concerns: None  Next PCP appt: 01/10/16 @ 1200  I reviewed health advisor's note, was available for consultation on the day of service listed in this note, and agree with documentation and plan. Elsie Stain, MD.

## 2015-10-07 NOTE — Progress Notes (Signed)
Subjective:   Douglas Nest Vessey Sr. is a 82 y.o. male who presents for an Initial Medicare Annual Wellness Visit.  Review of Systems  N/A Cardiac Risk Factors include: advanced age (>63men, >20 women);hypertension;male gender    Objective:    Today's Vitals   10/07/15 1139  BP: 142/54  Pulse: 72  Temp: 98.3 F (36.8 C)  TempSrc: Oral  Weight: 139 lb 4 oz (63.163 kg)  SpO2: 92%  PainSc: 0-No pain   Body mass index is 19.98 kg/(m^2).  Current Medications (verified) Outpatient Encounter Prescriptions as of 10/07/2015  Medication Sig  . aspirin 81 MG EC tablet Take 81 mg by mouth daily.    Marland Kitchen atorvastatin (LIPITOR) 20 MG tablet Take 1 tablet (20 mg total) by mouth at bedtime.  . finasteride (PROSCAR) 5 MG tablet Take 1 tablet (5 mg total) by mouth daily.  Marland Kitchen gabapentin (NEURONTIN) 100 MG capsule Take 1 capsule (100 mg total) by mouth 3 (three) times daily. For burning pain  . Oxycodone HCl 10 MG TABS Take 1 tablet (10 mg total) by mouth 3 (three) times daily as needed.  . TRAVATAN Z 0.004 % SOLN ophthalmic solution Place 1 drop into the right eye at bedtime.    No facility-administered encounter medications on file as of 10/07/2015.    Allergies (verified) Review of patient's allergies indicates no known allergies.   History: Past Medical History  Diagnosis Date  . Hyperlipidemia 09/05/95  . Diverticulosis of colon 06/05/2002  . Benign prostatic hypertrophy 10/18/00  . Hypertension 05/19/03  . CHF (congestive heart failure) ([place]) 04/21/03  . Scoliosis   . Colon polyps 06/05/2002    path could not be found   . Chronic diastolic congestive heart failure ([place])   . Aortic valve stenosis 09/10/2012  . Back pain   . Arthritis   . Cancer ([place])     skin cancers  . Thinning of skin     bruises easy.  . 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 Knee arthroscopy  06/1997    right  . Colonoscopy w/ biopsies  05/2002  . Flexible sigmoidoscopy  07/2005  . Cardiac catheterization  10/27/2013  . Tee without cardioversion    . Tonsillectomy    . Eye surgery      bilateral cataracts  . Transcatheter aortic valve replacement, transfemoral N/A 02/03/2014    Procedure: TRANSCATHETER AORTIC VALVE REPLACEMENT, TRANSFEMORAL;  Surgeon: Sherren Mocha, MD;  Location: [facility];  Service: Open Heart Surgery;  Laterality: N/A;  . Intraoperative transesophageal echocardiogram N/A 02/03/2014    Procedure: INTRAOPERATIVE TRANSESOPHAGEAL ECHOCARDIOGRAM;  Surgeon: Sherren Mocha, MD;  Location: [facility] OR;  Service: Open Heart Surgery;  Laterality: N/A;   Family History  Problem Relation Age of Onset  . Stroke Father   . Diabetes Brother   . Hypertension Other   . Hip fracture Sister     after a fall  . Diabetes Brother   . Coronary artery disease Brother     carotid stenosis  . Colon cancer Neg Hx   . Prostate cancer Neg Hx    Social History   Occupational History  . retired     [employer]   Social History Main Topics  . Smoking status: Former Smoker -- 1.00 packs/day for 20 years    Types: Cigarettes  Quit date: 01/11/1971  . Smokeless tobacco: Former Systems developer     Comment: quit 40 years ago  . Alcohol Use: No  . Drug Use: No  . Sexual Activity: Yes   Tobacco Counseling Counseling given: No   Activities of Daily Living In your present state of health, do you have any difficulty performing the following activities: 10/07/2015  Hearing? N  Vision? N  Difficulty concentrating or making decisions? N  Walking or climbing stairs? Y  Dressing or bathing? N  Doing errands, shopping? N  Preparing Food and eating ? N  Using the Toilet? N  In the past six months, have you accidently leaked urine? N  Do you have problems with loss of bowel control? N  Managing your Medications? N  Managing your  Finances? N  Housekeeping or managing your Housekeeping? N    Immunizations and Health Maintenance Immunization History  Administered Date(s) Administered  . Influenza Split 01/04/2011, 12/28/2011  . Influenza Whole 12/18/2004, 12/19/2006, 12/16/2007, 12/31/2008, 12/29/2009  . Influenza,inj,Quad PF,36+ Mos 12/25/2012, 12/10/2013, 12/04/2014  . Influenza-Unspecified 02/06/2014  . PPD Test 02/06/2014  . Pneumococcal Conjugate-13 10/07/2015  . Pneumococcal Polysaccharide-23 06/08/1997  . Pneumococcal-Unspecified 02/06/2014  . Td 03/20/1993, 10/31/2000, 08/31/2014   There are no preventive care reminders to display for this patient.  Patient Care Team: Tonia Ghent, MD as PCP - General (Family Medicine) Minna Merritts, MD as Consulting Physician (Cardiology)     Assessment:   This is a routine wellness examination for [place].  Hearing/Vision screen  Hearing Screening   125Hz  250Hz  500Hz  1000Hz  2000Hz  4000Hz  8000Hz   Right ear:   40 40 40 0   Left ear:   40 40 0 0   Vision Screening Comments: Last eye exam within 12 mths with [facility]  Dietary issues and exercise activities discussed: Current Exercise Habits: Home exercise routine, Type of exercise: stretching, Time (Minutes): 10, Frequency (Times/Week): 7, Weekly Exercise (Minutes/Week): 70, Intensity: Mild, Exercise limited by: orthopedic condition(s)  Goals    . Increase water intake     Starting 10/07/2015, I will attempt to drink at least 6 glasses of water daily.      Depression Screen PHQ 2/9 Scores 10/07/2015 03/06/2013  PHQ - 2 Score 0 0    Fall Risk Fall Risk  10/07/2015 01/01/2014 03/06/2013  Falls in the past year? Yes - Yes  Number falls in past yr: 1 - 1  Injury with Fall? No - No  Risk for fall due to : - Impaired vision;Medication side effect -  Follow up Falls evaluation completed - -    Cognitive Function: MMSE - Mini Mental State Exam 10/07/2015  Orientation to time 5  Orientation  to Place 5  Registration 3  Attention/ Calculation 0  Recall 2  Recall-comments pt was unable to recall 1 of 3 words  Language- name 2 objects 0  Language- repeat 1  Language- follow 3 step command 3  Language- read & follow direction 0  Write a sentence 0  Copy design 0  Total score 19   PLEASE NOTE: A Mini-Cog screen was completed. Maximum score is 20. A value of 0 denotes this part of Folstein MMSE was not completed or the patient failed this part of the Mini-Cog screening.   Mini-Cog Screening Orientation to Time - Max 5 pts Orientation to Place - Max 5 pts Registration - Max 3 pts Recall - Max 3 pts Language Repeat - Max 1 pts Language Follow 3  Step Command - Max 3 pts   Screening Tests Health Maintenance  Topic Date Due  . ZOSTAVAX  10/06/2016 (Originally 03/21/1989)  . INFLUENZA VACCINE  10/19/2015  . TETANUS/TDAP  08/30/2024  . PNA vac Low Risk Adult  Completed        Plan:     I have personally reviewed and addressed the Medicare Annual Wellness questionnaire and have noted the following in the patient's chart:  A. Medical and social history B. Use of alcohol, tobacco or illicit drugs  C. Current medications and supplements D. Functional ability and status E.  Nutritional status F.  Physical activity G. Advance directives H. List of other physicians I.  Hospitalizations, surgeries, and ER visits in previous 12 months J.  [place] to include hearing, vision, cognitive, depression L. Referrals and appointments - none  In addition, I have reviewed and discussed with patient certain preventive protocols, quality metrics, and best practice recommendations. A written personalized care plan for preventive services as well as general preventive health recommendations were provided to patient.  See attached scanned questionnaire for additional information.   Signed,   Lindell Noe, MHA, BS, LPN Health Advisor

## 2015-10-07 NOTE — Progress Notes (Signed)
Pre visit review using our clinic review tool, if applicable. No additional management support is needed unless otherwise documented below in the visit note.  Back pain, burning pain.  Taking oxycodone prn.  D/w pt about options and neuropathic pain in general.    He had stopped his iron in the meantime.  He wanted to limit his meds if possible.    Not SOB.  Less BLE edema, not taking lasix recently.    Had been off finasteride with slowing stream.    Meds, vitals, and allergies reviewed.   ROS: Per HPI unless specifically indicated in ROS section   nad ncat Neck supple, no LA rrr cta with changes in the chest sounds based on chronic postural changes Obvious scoliosis on exam.   abd soft No BLE edema.

## 2015-10-07 NOTE — Patient Instructions (Signed)
Add back finasteride.  That should help with making urine.  Add on gabapentin for the burning pain.  That should help.  Update me as needed.  Take care.  Glad to see you.

## 2015-10-07 NOTE — Progress Notes (Signed)
Pre visit review using our clinic review tool, if applicable. No additional management support is needed unless otherwise documented below in the visit note. 

## 2015-10-07 NOTE — Patient Instructions (Signed)
Douglas Parker , Thank you for taking time to come for your Medicare Wellness Visit. I appreciate your ongoing commitment to your health goals. Please review the following plan we discussed and let me know if I can assist you in the future.   These are the goals we discussed: Goals    . Increase water intake     Starting 10/07/2015, I will attempt to drink at least 6 glasses of water daily.       This is a list of the screening recommended for you and due dates:  Health Maintenance  Topic Date Due  . Shingles Vaccine  10/06/2016*  . Flu Shot  10/19/2015  . Tetanus Vaccine  08/30/2024  . Pneumonia vaccines  Completed  *Topic was postponed. The date shown is not the original due date.    Preventive Care for Adults  A healthy lifestyle and preventive care can promote health and wellness. Preventive health guidelines for adults include the following key practices.  . A routine yearly physical is a good way to check with your health care provider about your health and preventive screening. It is a chance to share any concerns and updates on your health and to receive a thorough exam.  . Visit your dentist for a routine exam and preventive care every 6 months. Brush your teeth twice a day and floss once a day. Good oral hygiene prevents tooth decay and gum disease.  . The frequency of eye exams is based on your age, health, family medical history, use  of contact lenses, and other factors. Follow your health care provider's ecommendations for frequency of eye exams.  . Eat a healthy diet. Foods like vegetables, fruits, whole grains, low-fat dairy products, and lean protein foods contain the nutrients you need without too many calories. Decrease your intake of foods high in solid fats, added sugars, and salt. Eat the right amount of calories for you. Get information about a proper diet from your health care provider, if necessary.  . Regular physical exercise is one of the most important  things you can do for your health. Most adults should get at least 150 minutes of moderate-intensity exercise (any activity that increases your heart rate and causes you to sweat) each week. In addition, most adults need muscle-strengthening exercises on 2 or more days a week.  Silver Sneakers may be a benefit available to you. To determine eligibility, you may visit the website: www.silversneakers.com or contact program at (205) 426-7872 Mon-Fri between 8AM-8PM.   . Maintain a healthy weight. The body mass index (BMI) is a screening tool to identify possible weight problems. It provides an estimate of body fat based on height and weight. Your health care provider can find your BMI and can help you achieve or maintain a healthy weight.   For adults 20 years and older: ? A BMI below 18.5 is considered underweight. ? A BMI of 18.5 to 24.9 is normal. ? A BMI of 25 to 29.9 is considered overweight. ? A BMI of 30 and above is considered obese.   . Maintain normal blood lipids and cholesterol levels by exercising and minimizing your intake of saturated fat. Eat a balanced diet with plenty of fruit and vegetables. Blood tests for lipids and cholesterol should begin at age 1 and be repeated every 5 years. If your lipid or cholesterol levels are high, you are over 50, or you are at high risk for heart disease, you may need your cholesterol levels checked more frequently.  Ongoing high lipid and cholesterol levels should be treated with medicines if diet and exercise are not working.  . If you smoke, find out from your health care provider how to quit. If you do not use tobacco, please do not start.  . If you choose to drink alcohol, please do not consume more than 2 drinks per day. One drink is considered to be 12 ounces (355 mL) of beer, 5 ounces (148 mL) of wine, or 1.5 ounces (44 mL) of liquor.  . If you are 90-43 years old, ask your health care provider if you should take aspirin to prevent  strokes.  . Use sunscreen. Apply sunscreen liberally and repeatedly throughout the day. You should seek shade when your shadow is shorter than you. Protect yourself by wearing long sleeves, pants, a wide-brimmed hat, and sunglasses year round, whenever you are outdoors.  . Once a month, do a whole body skin exam, using a mirror to look at the skin on your back. Tell your health care provider of new moles, moles that have irregular borders, moles that are larger than a pencil eraser, or moles that have changed in shape or color.

## 2015-10-08 NOTE — Assessment & Plan Note (Signed)
With chronic pain noted, no reason to suspect he would be a good surgical candidate.  Continue oxycodone for pain.  Add on gabapentin for the burning pain.  That should help with likely nerve root irritation.  Update me as needed.  Routine cautions given on med.  He agrees.

## 2015-10-08 NOTE — Assessment & Plan Note (Signed)
Add back finasteride.  That should help with LUTS.

## 2015-11-18 ENCOUNTER — Ambulatory Visit: Payer: Medicare Other | Admitting: Podiatry

## 2015-11-19 ENCOUNTER — Encounter: Payer: Self-pay | Admitting: Family Medicine

## 2015-11-19 ENCOUNTER — Ambulatory Visit (INDEPENDENT_AMBULATORY_CARE_PROVIDER_SITE_OTHER): Payer: Medicare Other | Admitting: Family Medicine

## 2015-11-19 VITALS — BP 160/80 | HR 69 | Temp 98.6°F | Wt 139.0 lb

## 2015-11-19 DIAGNOSIS — I10 Essential (primary) hypertension: Secondary | ICD-10-CM

## 2015-11-19 DIAGNOSIS — D649 Anemia, unspecified: Secondary | ICD-10-CM

## 2015-11-19 LAB — COMPREHENSIVE METABOLIC PANEL
ALK PHOS: 57 U/L (ref 39–117)
ALT: 12 U/L (ref 0–53)
AST: 13 U/L (ref 0–37)
Albumin: 4 g/dL (ref 3.5–5.2)
BILIRUBIN TOTAL: 0.8 mg/dL (ref 0.2–1.2)
BUN: 27 mg/dL — AB (ref 6–23)
CO2: 34 meq/L — AB (ref 19–32)
Calcium: 8.9 mg/dL (ref 8.4–10.5)
Chloride: 105 mEq/L (ref 96–112)
Creatinine, Ser: 0.94 mg/dL (ref 0.40–1.50)
GFR: 80.75 mL/min (ref >60.00)
GLUCOSE: 95 mg/dL (ref 70–99)
Potassium: 4 mEq/L (ref 3.5–5.1)
SODIUM: 143 meq/L (ref 135–145)
TOTAL PROTEIN: 6.3 g/dL (ref 6.0–8.3)

## 2015-11-19 LAB — CBC WITH DIFFERENTIAL/PLATELET
BASOS ABS: 0 10*3/uL (ref 0.0–0.1)
Basophils Relative: 0.4 % (ref 0.0–3.0)
Eosinophils Absolute: 0.1 10*3/uL (ref 0.0–0.7)
Eosinophils Relative: 1 % (ref 0.0–5.0)
HCT: 35.3 % — ABNORMAL LOW (ref 39.0–52.0)
Hemoglobin: 11.9 g/dL — ABNORMAL LOW (ref 13.0–17.0)
LYMPHS ABS: 1.1 10*3/uL (ref 0.7–4.0)
Lymphocytes Relative: 20 % (ref 12.0–46.0)
MCHC: 33.8 g/dL (ref 30.0–36.0)
MCV: 90 fl (ref 78.0–100.0)
MONO ABS: 0.3 10*3/uL (ref 0.1–1.0)
Monocytes Relative: 5.2 % (ref 3.0–12.0)
NEUTROS ABS: 3.9 10*3/uL (ref 1.4–7.7)
NEUTROS PCT: 73.4 % (ref 43.0–77.0)
PLATELETS: 148 10*3/uL — AB (ref 150.0–400.0)
RBC: 3.92 Mil/uL — AB (ref 4.22–5.81)
RDW: 13.5 % (ref 11.5–15.5)
WBC: 5.3 10*3/uL (ref 4.0–10.5)

## 2015-11-19 LAB — IBC PANEL
IRON: 76 ug/dL (ref 42–165)
SATURATION RATIOS: 29 % (ref 20.0–50.0)
Transferrin: 187 mg/dL — ABNORMAL LOW (ref 212.0–360.0)

## 2015-11-19 NOTE — Progress Notes (Signed)
Urine stream is better on finasteride.  D/w pt.   D/w pt prev about adding back gabapentin- it helps some with burning pain.    He has intermittent dizzy sensation, about 3 episodes.  It wasn't after taking the gabapentin, he didn't think the gabapentin was causing the sx.    When he feels dizzy, he means lightheaded.  He doesn't have true vertigo.  No syncope.  Last episode was about about 1-2 weeks ago.  He could feel it coming on and he sat down; it self resolved quickly.  No focal neuro changes.  No speech changes.  He didn't recall changes in daily routine, but he has some episodic variation in intake for solids and fluids.   H/o anemia.  He is off iron in the meantime.    Meds, vitals, and allergies reviewed.   ROS: Per HPI unless specifically indicated in ROS section   nad ncat Neck supple, no LA rrr cta with changes in the chest sounds based on chronic postural changes Obvious scoliosis on exam.   abd soft No BLE edema.

## 2015-11-19 NOTE — Progress Notes (Signed)
Pre visit review using our clinic review tool, if applicable. No additional management support is needed unless otherwise documented below in the visit note. 

## 2015-11-19 NOTE — Patient Instructions (Signed)
Go to the lab on the way out.  We'll contact you with your lab report. Drink enough fluid to keep your urine clear and try no to skip any meals.   Take care.  Glad to see you.  If your BP stays >140/>90 consistently then let me know.

## 2015-11-22 NOTE — Assessment & Plan Note (Addendum)
Recheck blood pressure was 160/80. At this point I don't want to induce hypotension. He isn't lightheaded on standing today. He doesn't have typical vertigo symptoms. We've elected not to change his medications. Recheck labs, especially given his history of anemia. See notes on labs. At this point okay for outpatient follow-up. He agrees.  He'll check blood pressure out of clinic and update me as needed, if persistently elevated. Some of his symptoms may be related to irregular PO intake. Discussed with him about routine meals and plenty of fluids.

## 2015-11-30 ENCOUNTER — Ambulatory Visit (INDEPENDENT_AMBULATORY_CARE_PROVIDER_SITE_OTHER): Payer: Medicare Other | Admitting: Family Medicine

## 2015-11-30 ENCOUNTER — Encounter: Payer: Self-pay | Admitting: Family Medicine

## 2015-11-30 VITALS — BP 142/62 | HR 69 | Temp 98.0°F | Wt 139.8 lb

## 2015-11-30 DIAGNOSIS — R0789 Other chest pain: Secondary | ICD-10-CM

## 2015-11-30 NOTE — Progress Notes (Signed)
Pre visit review using our clinic review tool, if applicable. No additional management support is needed unless otherwise documented below in the visit note. 

## 2015-11-30 NOTE — Progress Notes (Signed)
Chest was tight not last night but a few nights ago.  Woke up with sx.  Single night with sx.  Sx lasted for hours.  Resolved on its own.  No return since then.  B anterior chest pain, when it happened.  No sx like this prev.  He wasn't SOB.  No BLE edema.  No sensation of heartburn but "a solid pain."  No meds used for the episode.  Chest felt tight at the time.  No sx with walking then or now.    BP has been 130-140s/50-60s at home.  Last time he was lightheaded was this AM, also a few other times in the last few days.  No on any BP meds.  No h/o CABG or stent.  H/o valve repair noted.    PMH and SH reviewed  ROS: Per HPI unless specifically indicated in ROS section   Meds, vitals, and allergies reviewed.   nad ncat Neck supple, no LA rrr cta with changes in the chest sounds based on chronic postural changes Obvious scoliosis on exam.  abd soft No BLE edema.   EKG wnl, d/w pt at [facility].  Prev cath reviewed, w/o severe disease.

## 2015-12-01 ENCOUNTER — Encounter: Payer: Self-pay | Admitting: Family Medicine

## 2015-12-01 DIAGNOSIS — R0789 Other chest pain: Secondary | ICD-10-CM | POA: Insufficient documentation

## 2015-12-01 NOTE — Assessment & Plan Note (Signed)
He did not have exertional symptoms when he had pain. He does not have exertional symptoms now. His EKG is normal. His symptoms came on at rest. Self resolved. He doesn't have lower extremity edema now. He does not look to be in failure. No chest pain now. I think this likely was not a cardiac event. It happened when he was supine, could've been reflux related. Discussed with patient. He agrees. Reasonable to try Tums if he has another episode. If he continues to have symptoms he'll let me know. At this point still okay for outpatient follow-up. He agrees.  He did not have severe disease on previous catheterization. I highly doubt that he could've progressed to the point of having such severe cardiac disease that he would have symptoms at rest without having exertional symptoms.

## 2015-12-13 ENCOUNTER — Ambulatory Visit (INDEPENDENT_AMBULATORY_CARE_PROVIDER_SITE_OTHER): Payer: Medicare Other

## 2015-12-13 DIAGNOSIS — Z23 Encounter for immunization: Secondary | ICD-10-CM

## 2015-12-26 ENCOUNTER — Other Ambulatory Visit: Payer: Self-pay | Admitting: Family Medicine

## 2015-12-26 DIAGNOSIS — D649 Anemia, unspecified: Secondary | ICD-10-CM

## 2015-12-26 DIAGNOSIS — E785 Hyperlipidemia, unspecified: Secondary | ICD-10-CM

## 2016-01-03 ENCOUNTER — Other Ambulatory Visit: Payer: Medicare Other

## 2016-01-05 ENCOUNTER — Ambulatory Visit (INDEPENDENT_AMBULATORY_CARE_PROVIDER_SITE_OTHER): Payer: Medicare Other | Admitting: Family Medicine

## 2016-01-05 ENCOUNTER — Emergency Department: Payer: Medicare Other

## 2016-01-05 ENCOUNTER — Encounter: Payer: Self-pay | Admitting: Family Medicine

## 2016-01-05 ENCOUNTER — Encounter: Payer: Self-pay | Admitting: Emergency Medicine

## 2016-01-05 ENCOUNTER — Inpatient Hospital Stay
Admission: EM | Admit: 2016-01-05 | Discharge: 2016-01-12 | DRG: 190 | Disposition: A | Discharge status: Skilled Nursing Facility | Payer: Medicare Other | Attending: Internal Medicine | Admitting: Internal Medicine

## 2016-01-05 DIAGNOSIS — M419 Scoliosis, unspecified: Secondary | ICD-10-CM | POA: Diagnosis present

## 2016-01-05 DIAGNOSIS — Z7982 Long term (current) use of aspirin: Secondary | ICD-10-CM

## 2016-01-05 DIAGNOSIS — E860 Dehydration: Secondary | ICD-10-CM | POA: Diagnosis present

## 2016-01-05 DIAGNOSIS — R0902 Hypoxemia: Secondary | ICD-10-CM

## 2016-01-05 DIAGNOSIS — J9601 Acute respiratory failure with hypoxia: Secondary | ICD-10-CM | POA: Diagnosis present

## 2016-01-05 DIAGNOSIS — G629 Polyneuropathy, unspecified: Secondary | ICD-10-CM | POA: Diagnosis present

## 2016-01-05 DIAGNOSIS — J61 Pneumoconiosis due to asbestos and other mineral fibers: Secondary | ICD-10-CM | POA: Diagnosis present

## 2016-01-05 DIAGNOSIS — J441 Chronic obstructive pulmonary disease with (acute) exacerbation: Secondary | ICD-10-CM | POA: Diagnosis present

## 2016-01-05 DIAGNOSIS — Z87891 Personal history of nicotine dependence: Secondary | ICD-10-CM

## 2016-01-05 DIAGNOSIS — I350 Nonrheumatic aortic (valve) stenosis: Secondary | ICD-10-CM | POA: Diagnosis present

## 2016-01-05 DIAGNOSIS — Z77090 Contact with and (suspected) exposure to asbestos: Secondary | ICD-10-CM | POA: Diagnosis present

## 2016-01-05 DIAGNOSIS — Z8249 Family history of ischemic heart disease and other diseases of the circulatory system: Secondary | ICD-10-CM

## 2016-01-05 DIAGNOSIS — R0600 Dyspnea, unspecified: Secondary | ICD-10-CM

## 2016-01-05 DIAGNOSIS — J440 Chronic obstructive pulmonary disease with acute lower respiratory infection: Secondary | ICD-10-CM | POA: Diagnosis present

## 2016-01-05 DIAGNOSIS — Z86010 Personal history of colonic polyps: Secondary | ICD-10-CM

## 2016-01-05 DIAGNOSIS — Z9889 Other specified postprocedural states: Secondary | ICD-10-CM

## 2016-01-05 DIAGNOSIS — I5032 Chronic diastolic (congestive) heart failure: Secondary | ICD-10-CM | POA: Diagnosis present

## 2016-01-05 DIAGNOSIS — I272 Pulmonary hypertension, unspecified: Secondary | ICD-10-CM | POA: Diagnosis present

## 2016-01-05 DIAGNOSIS — J189 Pneumonia, unspecified organism: Secondary | ICD-10-CM | POA: Diagnosis present

## 2016-01-05 DIAGNOSIS — I248 Other forms of acute ischemic heart disease: Secondary | ICD-10-CM | POA: Diagnosis present

## 2016-01-05 DIAGNOSIS — I110 Hypertensive heart disease with heart failure: Secondary | ICD-10-CM | POA: Diagnosis present

## 2016-01-05 DIAGNOSIS — E785 Hyperlipidemia, unspecified: Secondary | ICD-10-CM | POA: Diagnosis present

## 2016-01-05 DIAGNOSIS — Z952 Presence of prosthetic heart valve: Secondary | ICD-10-CM

## 2016-01-05 DIAGNOSIS — Z85828 Personal history of other malignant neoplasm of skin: Secondary | ICD-10-CM | POA: Diagnosis not present

## 2016-01-05 DIAGNOSIS — R918 Other nonspecific abnormal finding of lung field: Secondary | ICD-10-CM

## 2016-01-05 DIAGNOSIS — Z823 Family history of stroke: Secondary | ICD-10-CM | POA: Diagnosis not present

## 2016-01-05 DIAGNOSIS — N400 Enlarged prostate without lower urinary tract symptoms: Secondary | ICD-10-CM | POA: Diagnosis present

## 2016-01-05 DIAGNOSIS — Z79899 Other long term (current) drug therapy: Secondary | ICD-10-CM

## 2016-01-05 DIAGNOSIS — R0602 Shortness of breath: Secondary | ICD-10-CM | POA: Diagnosis present

## 2016-01-05 DIAGNOSIS — Z833 Family history of diabetes mellitus: Secondary | ICD-10-CM | POA: Diagnosis not present

## 2016-01-05 DIAGNOSIS — R262 Difficulty in walking, not elsewhere classified: Secondary | ICD-10-CM

## 2016-01-05 DIAGNOSIS — M6281 Muscle weakness (generalized): Secondary | ICD-10-CM

## 2016-01-05 LAB — CBC WITH DIFFERENTIAL/PLATELET
BASOS ABS: 0 10*3/uL (ref 0–0.1)
Basophils Relative: 0 %
EOS PCT: 0 %
Eosinophils Absolute: 0 10*3/uL (ref 0–0.7)
HCT: 35.2 % — ABNORMAL LOW (ref 40.0–52.0)
Hemoglobin: 11.9 g/dL — ABNORMAL LOW (ref 13.0–18.0)
LYMPHS PCT: 5 %
Lymphs Abs: 0.4 10*3/uL — ABNORMAL LOW (ref 1.0–3.6)
MCH: 30.9 pg (ref 26.0–34.0)
MCHC: 33.8 g/dL (ref 32.0–36.0)
MCV: 91.6 fL (ref 80.0–100.0)
MONO ABS: 0.6 10*3/uL (ref 0.2–1.0)
Monocytes Relative: 8 %
Neutro Abs: 7.3 10*3/uL — ABNORMAL HIGH (ref 1.4–6.5)
Neutrophils Relative %: 87 %
PLATELETS: 128 10*3/uL — AB (ref 150–440)
RBC: 3.84 MIL/uL — ABNORMAL LOW (ref 4.40–5.90)
RDW: 13.5 % (ref 11.5–14.5)
WBC: 8.4 10*3/uL (ref 3.8–10.6)

## 2016-01-05 LAB — URINALYSIS COMPLETE WITH MICROSCOPIC (ARMC ONLY)
BACTERIA UA: NONE SEEN
Bilirubin Urine: NEGATIVE
GLUCOSE, UA: NEGATIVE mg/dL
KETONES UR: NEGATIVE mg/dL
Leukocytes, UA: NEGATIVE
NITRITE: NEGATIVE
PROTEIN: 100 mg/dL — AB
Specific Gravity, Urine: 1.028 (ref 1.005–1.030)
Squamous Epithelial / LPF: NONE SEEN
pH: 5 (ref 5.0–8.0)

## 2016-01-05 LAB — COMPREHENSIVE METABOLIC PANEL
ALBUMIN: 3.4 g/dL — AB (ref 3.5–5.0)
ALT: 41 U/L (ref 17–63)
AST: 28 U/L (ref 15–41)
Alkaline Phosphatase: 57 U/L (ref 38–126)
Anion gap: 6 (ref 5–15)
BILIRUBIN TOTAL: 1.7 mg/dL — AB (ref 0.3–1.2)
BUN: 41 mg/dL — AB (ref 6–20)
CO2: 29 mmol/L (ref 22–32)
Calcium: 8.6 mg/dL — ABNORMAL LOW (ref 8.9–10.3)
Chloride: 107 mmol/L (ref 101–111)
Creatinine, Ser: 1.03 mg/dL (ref 0.61–1.24)
GFR calc Af Amer: >60 mL/min (ref >60)
GFR calc non Af Amer: >60 mL/min (ref >60)
GLUCOSE: 123 mg/dL — AB (ref 65–99)
POTASSIUM: 4.1 mmol/L (ref 3.5–5.1)
Sodium: 142 mmol/L (ref 135–145)
TOTAL PROTEIN: 6.6 g/dL (ref 6.5–8.1)

## 2016-01-05 LAB — TROPONIN I: Troponin I: 0.06 ng/mL (ref <0.03)

## 2016-01-05 LAB — BRAIN NATRIURETIC PEPTIDE: B Natriuretic Peptide: 575 pg/mL — ABNORMAL HIGH (ref 0.0–100.0)

## 2016-01-05 LAB — LACTIC ACID, PLASMA: Lactic Acid, Venous: 1.2 mmol/L (ref 0.5–1.9)

## 2016-01-05 MED ORDER — DEXTROSE 5 % IV SOLN
500.0000 mg | INTRAVENOUS | Status: DC
  Filled 2016-01-05: qty 500

## 2016-01-05 MED ORDER — ENOXAPARIN SODIUM 40 MG/0.4ML ~~LOC~~ SOLN
40.0000 mg | SUBCUTANEOUS | Status: DC
  Administered 2016-01-06 – 2016-01-11 (×6): 40 mg via SUBCUTANEOUS
  Filled 2016-01-05 (×6): qty 0.4

## 2016-01-05 MED ORDER — DEXTROSE 5 % IV SOLN: 1.0000 g | INTRAVENOUS | Status: DC

## 2016-01-05 MED ORDER — GUAIFENESIN-DM 100-10 MG/5ML PO SYRP: 5.0000 mL | ORAL_SOLUTION | ORAL | Status: DC | PRN

## 2016-01-05 MED ORDER — IPRATROPIUM-ALBUTEROL 0.5-2.5 (3) MG/3ML IN SOLN
3.0000 mL | Freq: Four times a day (QID) | RESPIRATORY_TRACT | Status: DC
Start: 2016-01-05 — End: 2016-01-07
  Administered 2016-01-05 – 2016-01-07 (×6): 3 mL via RESPIRATORY_TRACT
  Filled 2016-01-05 (×7): qty 3

## 2016-01-05 MED ORDER — GABAPENTIN 100 MG PO CAPS
100.0000 mg | ORAL_CAPSULE | Freq: Three times a day (TID) | ORAL | Status: DC
  Administered 2016-01-05 – 2016-01-12 (×20): 100 mg via ORAL
  Filled 2016-01-05 (×20): qty 1

## 2016-01-05 MED ORDER — ATORVASTATIN CALCIUM 20 MG PO TABS
20.0000 mg | ORAL_TABLET | Freq: Every day | ORAL | Status: DC
Start: 2016-01-05 — End: 2016-01-12
  Administered 2016-01-05 – 2016-01-11 (×7): 20 mg via ORAL
  Filled 2016-01-05 (×7): qty 1

## 2016-01-05 MED ORDER — ASPIRIN EC 81 MG PO TBEC
81.0000 mg | DELAYED_RELEASE_TABLET | Freq: Every day | ORAL | Status: DC
  Administered 2016-01-06 – 2016-01-10 (×5): 81 mg via ORAL
  Filled 2016-01-05 (×5): qty 1

## 2016-01-05 MED ORDER — AZITHROMYCIN 500 MG IV SOLR
500.0000 mg | INTRAVENOUS | Status: DC
  Administered 2016-01-05: 500 mg via INTRAVENOUS
  Filled 2016-01-05: qty 500

## 2016-01-05 MED ORDER — CEFTRIAXONE SODIUM-DEXTROSE 1-3.74 GM-% IV SOLR
1.0000 g | INTRAVENOUS | Status: DC
  Administered 2016-01-06 – 2016-01-11 (×6): 1 g via INTRAVENOUS
  Filled 2016-01-05 (×7): qty 50

## 2016-01-05 MED ORDER — FINASTERIDE 5 MG PO TABS
5.0000 mg | ORAL_TABLET | Freq: Every day | ORAL | Status: DC
  Administered 2016-01-06 – 2016-01-12 (×7): 5 mg via ORAL
  Filled 2016-01-05 (×7): qty 1

## 2016-01-05 MED ORDER — CEFTRIAXONE SODIUM-DEXTROSE 1-3.74 GM-% IV SOLR
1.0000 g | INTRAVENOUS | Status: DC
  Administered 2016-01-05: 1 g via INTRAVENOUS
  Filled 2016-01-05: qty 50

## 2016-01-05 MED ORDER — OXYCODONE HCL 5 MG PO TABS: 10.0000 mg | ORAL_TABLET | Freq: Three times a day (TID) | ORAL | Status: DC | PRN

## 2016-01-05 MED ORDER — SODIUM CHLORIDE 0.9 % IV SOLN
INTRAVENOUS | Status: DC
  Administered 2016-01-05: 23:00:00 via INTRAVENOUS

## 2016-01-05 MED ORDER — CEFTRIAXONE SODIUM 1 G IJ SOLR: 1.0000 g | INTRAMUSCULAR | Status: DC

## 2016-01-05 MED ORDER — LATANOPROST 0.005 % OP SOLN
1.0000 [drp] | Freq: Every day | OPHTHALMIC | Status: DC
  Administered 2016-01-05 – 2016-01-11 (×7): 1 [drp] via OPHTHALMIC
  Filled 2016-01-05: qty 2.5

## 2016-01-05 NOTE — ED Notes (Signed)
Dr Karma Greaser at bedside updating pt on plan of care

## 2016-01-05 NOTE — Progress Notes (Signed)
Pharmacy progress note Antibiotic renal dosing:  Patient is not currently on any antibiotic therapy that requires renal adjustment. Pharmacy will follow and manage as appropriate.

## 2016-01-05 NOTE — H&P (Signed)
Douglas Parker at [place] NAME: Douglas Parker    MR#:  [id number]  DATE OF BIRTH:  11-Aug-1927  DATE OF ADMISSION:  01/05/2016  PRIMARY CARE PHYSICIAN: Elsie Stain, MD   REQUESTING/REFERRING PHYSICIAN: Hinda Kehr, MD  CHIEF COMPLAINT:   Chief Complaint  Patient presents with  . Shortness of Breath  . Cough   Cough and shortness of breath for several days. HISTORY OF PRESENT ILLNESS:  Douglas Parker  is a 82 y.o. male with a known history of Hypertension, hyperlipidemia, chronic diastolic CHF and aortic valve stenosis. The patient presented to the ED with the above chief complaints. He has had cough, shortness breasts and wheezing for the past few days, which has been worsening for the past 2 days. He also complains of fever, chills and generalized weakness. He was found hypoxia with oxygen level at 80s in the ED. Chest x-ray and a CT of the chest show bilateral infiltrate and opacity in upper lobes. He was treated with Zithromax and Rocephin in the ED.  PAST MEDICAL HISTORY:   Past Medical History:  Diagnosis Date  . Aortic valve stenosis 09/10/2012  . Arthritis   . Back pain   . Benign prostatic hypertrophy 10/18/00  . Cancer ([place])    skin cancers  . CHF (congestive heart failure) ([place]) 04/21/03  . Chronic diastolic congestive heart failure ([place])   . Colon polyps 06/05/2002   path could not be found   . Diverticulosis of colon 06/05/2002  . Hyperlipidemia 09/05/95  . Hypertension 05/19/03  . 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 Scoliosis   . Thinning of skin    bruises easy.    PAST SURGICAL HISTORY:   Past Surgical History:  Procedure Laterality Date  . CARDIAC CATHETERIZATION  10/27/2013  . COLONOSCOPY W/ BIOPSIES  05/2002  . EYE SURGERY     bilateral cataracts  . FLEXIBLE SIGMOIDOSCOPY  07/2005  .  INTRAOPERATIVE TRANSESOPHAGEAL ECHOCARDIOGRAM N/A 02/03/2014   Procedure: INTRAOPERATIVE TRANSESOPHAGEAL ECHOCARDIOGRAM;  Surgeon: Sherren Mocha, MD;  Location: [facility] OR;  Service: Open Heart Surgery;  Laterality: N/A;  . KNEE ARTHROSCOPY  06/1997   right  . TEE WITHOUT CARDIOVERSION    . TONSILLECTOMY    . TRANSCATHETER AORTIC VALVE REPLACEMENT, TRANSFEMORAL N/A 02/03/2014   Procedure: TRANSCATHETER AORTIC VALVE REPLACEMENT, TRANSFEMORAL;  Surgeon: Sherren Mocha, MD;  Location: [place];  Service: Open Heart Surgery;  Laterality: N/A;    SOCIAL HISTORY:   Social History  Substance Use Topics  . Smoking status: Former Smoker    Packs/day: 1.00    Years: 20.00    Types: Cigarettes    Quit date: 01/11/1971  . Smokeless tobacco: Former Systems developer     Comment: quit 40 years ago  . Alcohol use No    FAMILY HISTORY:   Family History  Problem Relation Age of Onset  . Stroke Father   . Diabetes Brother   . Hip fracture Sister     after a fall  . Diabetes Brother   . Coronary artery disease Brother     carotid stenosis  . Hypertension Other   . Colon cancer Neg Hx   . Prostate cancer Neg Hx     DRUG ALLERGIES:  No Known Allergies  REVIEW OF SYSTEMS:   Review of Systems  Constitutional: Positive for chills, fever and malaise/fatigue.  HENT: Negative  for sore throat.   Eyes: Negative for blurred vision and double vision.  Respiratory: Positive for cough, sputum production, shortness of breath and wheezing. Negative for hemoptysis and stridor.   Cardiovascular: Negative for chest pain, palpitations and leg swelling.  Gastrointestinal: Positive for nausea. Negative for blood in stool, melena and vomiting.  Genitourinary: Negative for dysuria, frequency and urgency.  Musculoskeletal: Negative for joint pain.  Skin: Negative for itching and rash.  Neurological: Positive for weakness. Negative for dizziness, focal weakness and loss of consciousness.  Psychiatric/Behavioral: Negative for  depression. The patient is not nervous/anxious.     MEDICATIONS AT HOME:   Prior to Admission medications   Medication Sig Start Date End Date Taking? Authorizing Provider  aspirin 81 MG EC tablet Take 81 mg by mouth daily.     Yes Historical Provider, MD  atorvastatin (LIPITOR) 20 MG tablet Take 1 tablet (20 mg total) by mouth at bedtime. 05/07/15  Yes Tonia Ghent, MD  finasteride (PROSCAR) 5 MG tablet Take 1 tablet (5 mg total) by mouth daily. 10/07/15  Yes Tonia Ghent, MD  gabapentin (NEURONTIN) 100 MG capsule Take 1 capsule (100 mg total) by mouth 3 (three) times daily. For burning pain 10/07/15  Yes Tonia Ghent, MD  Oxycodone HCl 10 MG TABS Take 1 tablet (10 mg total) by mouth 3 (three) times daily as needed. 10/07/15  Yes Tonia Ghent, MD  TRAVATAN Z 0.004 % SOLN ophthalmic solution Place 1 drop into the right eye at bedtime.  08/01/12  Yes Historical Provider, MD      VITAL SIGNS:  Blood pressure 137/75, pulse 85, temperature 98.6 F (37 C), temperature source Oral, resp. rate 18, height 5\' 10"  (1.778 m), weight 145 lb (65.8 kg), SpO2 97 %.  PHYSICAL EXAMINATION:  Physical Exam  GENERAL:  82 y.o.-year-old patient lying in the bed with no acute distress. Thin and looks fragile. EYES: Pupils equal, round, reactive to light and accommodation. No scleral icterus. Extraocular muscles intact.  HEENT: Head atraumatic, normocephalic. Oropharynx and nasopharynx clear.  NECK:  Supple, no jugular venous distention. No thyroid enlargement, no tenderness.  LUNGS: Normal breath sounds bilaterally, no wheezing, but has crackles. No use of accessory muscles of respiration.  CARDIOVASCULAR: S1, S2 normal. No murmurs, rubs, or gallops.  ABDOMEN: Soft, nontender, nondistended. Bowel sounds present. No organomegaly or mass.  EXTREMITIES: No pedal edema, cyanosis, or clubbing.  NEUROLOGIC: Cranial nerves II through XII are intact. Muscle strength 4/5 in all extremities. Sensation intact.  Gait not checked.  PSYCHIATRIC: The patient is alert and oriented x 3.  SKIN: No obvious rash, lesion, or ulcer. Poor turgor.  LABORATORY PANEL:   CBC  Recent Labs Lab 01/05/16 1555  WBC 8.4  HGB 11.9*  HCT 35.2*  PLT 128*   ------------------------------------------------------------------------------------------------------------------  Chemistries   Recent Labs Lab 01/05/16 1555  NA 142  K 4.1  CL 107  CO2 29  GLUCOSE 123*  BUN 41*  CREATININE 1.03  CALCIUM 8.6*  AST 28  ALT 41  ALKPHOS 57  BILITOT 1.7*   ------------------------------------------------------------------------------------------------------------------  Cardiac Enzymes  Recent Labs Lab 01/05/16 1555  TROPONINI 0.06*   ------------------------------------------------------------------------------------------------------------------  RADIOLOGY:  Dg Chest 2 View  Result Date: 01/05/2016 CLINICAL DATA:  Cough and worsening shortness of breath earlier today. EXAM: CHEST  2 VIEW COMPARISON:  02/04/2014; 02/03/2014 FINDINGS: Grossly unchanged enlarged cardiac silhouette and mediastinal contours with atherosclerotic plaque within thoracic aorta. Post aortic valve replacement. Pulmonary vasculature appears less distinct  than present examination with cephalization of flow. Interval development of a small potentially partially loculated left-sided effusion with associated worsening left basilar opacities. Pleural calcifications are again seen bilaterally. No pneumothorax. No acute osseus abnormalities. Moderate scoliotic curvature of the thoracolumbar spine. Mild-to-moderate degenerative change of the bilateral glenohumeral joints. IMPRESSION: 1. Findings worrisome for pulmonary edema with associated potentially partially loculated left-sided effusion and worsening left basilar opacities, atelectasis versus infiltrate. A follow-up chest radiograph in 3 to 4 weeks after treatment is recommended to ensure  resolution. 2. Re- demonstrated bilateral pleural calcifications compatible with prior asbestos exposure. Electronically Signed   By: Sandi Mariscal M.D.   On: 01/05/2016 16:40   Ct Chest Wo Contrast  Result Date: 01/05/2016 CLINICAL DATA:  Cough and shortness of breath for 2 days, respiratory distress, pulmonary edema and/or pneumonia on chest radiography, hypoxemia EXAM: CT CHEST WITHOUT CONTRAST TECHNIQUE: Multidetector CT imaging of the chest was performed following the standard protocol without IV contrast. COMPARISON:  Chest radiographs 01/05/2016 FINDINGS: Cardiovascular: Atherosclerotic calcifications at the aorta, coronary arteries, and proximal great vessels. Post TAVR. Minimal pericardial effusion. Aorta normal caliber. Mediastinum/Nodes: No definite thoracic adenopathy, hilar assessment limited by lack of IV contrast. Esophagus unremarkable. Lungs/Pleura: Extensive calcified pleural plaques bilaterally consistent with asbestos exposure. BILATERAL pleural effusions, moderate RIGHT and small LEFT. Peribronchial thickening. Central pulmonary infiltrates question edema versus infection. Additional focal opacity RIGHT upper lobe 18 x 13 mm image 46 may represent more focal consolidation or edema though followup until resolution required to exclude underlying neoplasm. Several additional subtle areas of more focal opacity are seen particularly in the RIGHT upper lobe notably image 54. No pneumothorax. Compressive atelectasis of both lower lobes. Upper Abdomen: Large cyst LEFT lobe liver 6.5 x 5.2 cm image 145. Small cyst RIGHT lobe liver 7 mm diameter image 132. Musculoskeletal: Osseous demineralization. Significant rotatory dextroconvex scoliosis thoracic spine. Scattered degenerative changes thoracic spine. IMPRESSION: Extensive atherosclerotic disease including aortic atherosclerosis and coronary arterial calcification. Calcified pleural plaques bilaterally consistent with asbestos exposure. Post TAVR.  BILATERAL pulmonary infiltrates greatest in upper lobes which could represent edema or infection, with more focal areas of opacity particularly in the RIGHT upper lobe as described above; followup CT imaging recommended in 3 months to confirm resolution of these focal opacities and exclude tumor. BILATERAL pleural effusions RIGHT greater than LEFT. Hepatic cysts. Electronically Signed   By: Lavonia Dana M.D.   On: 01/05/2016 17:37      IMPRESSION AND PLAN:   Pneumonia (CAP). The patient will be admitted to medical floor. Continue Zithromax and Rocephin, start Robitussin and DuoNeb. Pulmonary consult. Follow-up cultures and CBC.  Acute respiratory failure with hypoxia. Continue oxygen and it cannula, DuoNeb and Robitussin when necessary.  Dehydration. Start normal saline IV and follow-up BMP.  Elevated troponin, possible due to demanding ischemia due to above. Follow-up troponin, start aspirin and Lipitor.  History of chronic diastolic CHF. Stable.  Hypertension. Controlled without medication.  Generalized weakness. PT evaluation.  All the records are reviewed and case discussed with ED provider. Management plans discussed with the patient, His wife and they are in agreement.  CODE STATUS: Full code  TOTAL TIME TAKING CARE OF THIS PATIENT: 55 minutes.    Demetrios Loll M.D on 01/05/2016 at 7:15 PM  Between 7am to 6pm - Pager - [phone]  After 6pm go to [email or web address] - Proofreader  [facility]  Office  [phone]  CC: Primary care physician; Elsie Stain, MD   Note: This  dictation was prepared with Dragon dictation along with smaller phrase technology. Any transcriptional errors that result from this process are unintentional.

## 2016-01-05 NOTE — Assessment & Plan Note (Signed)
Concern for CHF vs PNA. D/w pt.  Hypoxia resolved with O2 at 2L via [place].  H/o TAVR and CHF with higher risk of PNA given h/o scoliosis.   EMS called.  Needs ER eval.  Patient agrees.   EMS in route at this point.  Patient still on O2.  Case and plan d/w pt.  App help of all involved.  >25 minutes spent in face to face time with patient, >50% spent in counselling or coordination of care.

## 2016-01-05 NOTE — Progress Notes (Signed)
Pre visit review using our clinic review tool, if applicable. No additional management support is needed unless otherwise documented below in the visit note. 

## 2016-01-05 NOTE — Patient Instructions (Signed)
To ER

## 2016-01-05 NOTE — ED Provider Notes (Signed)
[facility] Emergency Department Provider Note  ____________________________________________   First MD Initiated Contact with Patient 01/05/16 1557     (approximate)  I have reviewed the triage vital signs and the nursing notes.   HISTORY  Chief Complaint Shortness of Breath and Cough    HPI Douglas Dresser Cadenas Sr. is a 82 y.o. male who presents to the emergency department by EMS to be evaluated for her severe shortness of breath.  This has been worsening over the last several days.  Has apparently have some chroniclung disease but over the last few days he has been increasingly short of breath with severe coughing.  Nothing is making it better or worse.  He is not on oxygen at baseline when EMS arrived he had an oxygen saturation in the low 80s and was placed on 2 L of oxygen which has brought his SPO2 to the mid upper 90s.  He is ill appearing and thin.  He does have occasional chest pain related to the shortness of breath.  Overall he describes his symptoms as severe.  He has also had several episodes of vomiting recently but has no abdominal pain.   Past Medical History:  Diagnosis Date  . Aortic valve stenosis 09/10/2012  . Arthritis   . Back pain   . Benign prostatic hypertrophy 10/18/00  . Cancer ([place])    skin cancers  . CHF (congestive heart failure) ([place]) 04/21/03  . Chronic diastolic congestive heart failure ([place])   . Colon polyps 06/05/2002   path could not be found   . Diverticulosis of colon 06/05/2002  . Hyperlipidemia 09/05/95  . Hypertension 05/19/03  . 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 Scoliosis   . Thinning of skin    bruises easy.    Patient Active Problem List   Diagnosis Date Noted  . Hypoxia 01/05/2016  . Atypical chest pain 12/01/2015  . Cervical lymphadenopathy 04/20/2015  . Nausea without vomiting  11/11/2014  . Fatigue 06/19/2014  . CCF (congestive cardiac failure) ([place]) 04/03/2014  . BPH (benign prostatic hyperplasia) 02/10/2014  . Dyslipidemia 02/10/2014  . GERD (gastroesophageal reflux disease) 02/10/2014  . Severe aortic valve stenosis 02/03/2014  . 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 Aortic stenosis, severe 12/19/2013  . Chronic diastolic congestive heart failure ([place])   . Back pain   . Edema 11/20/2013  . Loss of weight 06/26/2013  . Cornea scar 04/09/2013  . Medicare annual wellness visit, initial 03/06/2013  . Chronic glaucoma 12/17/2012  . Shortness of breath 10/31/2012  . Aortic valve stenosis 09/10/2012  . Pseudoaphakia 06/28/2012  . Living will, counseling/discussion 12/28/2011  . 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 Degeneration macular 03/06/2011  . Cataract, nuclear sclerotic senile 03/06/2011  . Constipation 01/02/2007  . Essential hypertension 05/19/2003  . CA IN SITU, SKIN NOS 05/19/1999  . Arthropathy of pelvic region and thigh 05/18/1997  . HEMORRHOIDS, INTERNAL, WITH BLEEDING 03/20/1994  . DIVERTICULOSIS, COLON 02/17/1994  . FX OPEN MLT PELVIS W/PELVIC CIRCULAT DISRUPT 03/20/1986  . SCOLIOSIS, THORACIC SPINE 03/20/1944    Past Surgical History:  Procedure Laterality Date  . CARDIAC CATHETERIZATION  10/27/2013  . COLONOSCOPY W/ BIOPSIES  05/2002  . EYE SURGERY     bilateral cataracts  . FLEXIBLE SIGMOIDOSCOPY  07/2005  . INTRAOPERATIVE TRANSESOPHAGEAL ECHOCARDIOGRAM N/A 02/03/2014   Procedure: INTRAOPERATIVE TRANSESOPHAGEAL ECHOCARDIOGRAM;  Surgeon: Legrand Como  Burt Knack, MD;  Location: [place];  Service: Open Heart Surgery;  Laterality: N/A;  . KNEE ARTHROSCOPY  06/1997   right  . TEE WITHOUT CARDIOVERSION    . TONSILLECTOMY    . TRANSCATHETER AORTIC VALVE REPLACEMENT, TRANSFEMORAL N/A 02/03/2014   Procedure: TRANSCATHETER AORTIC VALVE REPLACEMENT, TRANSFEMORAL;  Surgeon: Sherren Mocha, MD;  Location: [place];  Service: Open  Heart Surgery;  Laterality: N/A;    Prior to Admission medications   Medication Sig Start Date End Date Taking? Authorizing Provider  aspirin 81 MG EC tablet Take 81 mg by mouth daily.     Yes Historical Provider, MD  atorvastatin (LIPITOR) 20 MG tablet Take 1 tablet (20 mg total) by mouth at bedtime. 05/07/15  Yes Tonia Ghent, MD  finasteride (PROSCAR) 5 MG tablet Take 1 tablet (5 mg total) by mouth daily. 10/07/15  Yes Tonia Ghent, MD  gabapentin (NEURONTIN) 100 MG capsule Take 1 capsule (100 mg total) by mouth 3 (three) times daily. For burning pain 10/07/15  Yes Tonia Ghent, MD  Oxycodone HCl 10 MG TABS Take 1 tablet (10 mg total) by mouth 3 (three) times daily as needed. 10/07/15  Yes Tonia Ghent, MD  TRAVATAN Z 0.004 % SOLN ophthalmic solution Place 1 drop into the right eye at bedtime.  08/01/12  Yes Historical Provider, MD    Allergies Review of patient's allergies indicates no known allergies.  Family History  Problem Relation Age of Onset  . Stroke Father   . Diabetes Brother   . Hip fracture Sister     after a fall  . Diabetes Brother   . Coronary artery disease Brother     carotid stenosis  . Hypertension Other   . Colon cancer Neg Hx   . Prostate cancer Neg Hx     Social History Social History  Substance Use Topics  . Smoking status: Former Smoker    Packs/day: 1.00    Years: 20.00    Types: Cigarettes    Quit date: 01/11/1971  . Smokeless tobacco: Former Systems developer     Comment: quit 40 years ago  . Alcohol use No    Review of Systems Constitutional: No fever/chills Eyes: No visual changes. ENT: No sore throat. Cardiovascular: +chest pain. Respiratory: +shortness of breath. Gastrointestinal: No abdominal pain.  No nausea, no vomiting.  No diarrhea.  No constipation. Genitourinary: Negative for dysuria. Musculoskeletal: Negative for back pain. Skin: Negative for rash. Neurological: Negative for headaches, focal weakness or numbness.  10-point  ROS otherwise negative.  ____________________________________________   PHYSICAL EXAM:  VITAL SIGNS: ED Triage Vitals  Enc Vitals Group     BP 01/05/16 1549 (!) 142/113     Pulse Rate 01/05/16 1549 85     Resp 01/05/16 1549 16     Temp 01/05/16 1549 98.6 F (37 C)     Temp Source 01/05/16 1549 Oral     SpO2 01/05/16 1549 97 %     Weight 01/05/16 1550 145 lb (65.8 kg)     Height 01/05/16 1550 5\' 10"  (1.778 m)     Head Circumference --      Peak Flow --      Pain Score --      Pain Loc --      Pain Edu? --      Excl. in [place]? --     Constitutional: Alert and oriented. Appears chronically  Eyes: Conjunctivae are normal. PERRL. EOMI. Head: Atraumatic. Nose: No congestion/rhinnorhea. Mouth/Throat: Mucous  membranes are moist.  Oropharynx non-erythematous. Neck: No stridor.  No meningeal signs.   Cardiovascular: Normal rate, regular rhythm. Good peripheral circulation. Grossly normal heart sounds. Respiratory: Increased respiratory effort.  No retractions. Coarse breath sounds in bases.  No wheezing. Gastrointestinal: Soft and nontender. No distention.  Musculoskeletal: No lower extremity tenderness nor edema. No gross deformities of extremities. Neurologic:  Normal speech and language. No gross focal neurologic deficits are appreciated.  Skin:  Skin is warm, dry and intact. No rash noted. Psychiatric: Mood and affect are normal. Speech and behavior are normal.  ____________________________________________   LABS (all labs ordered are listed, but only abnormal results are displayed)  Labs Reviewed  BRAIN NATRIURETIC PEPTIDE - Abnormal; Notable for the following:       Result Value   B Natriuretic Peptide 575.0 (*)    All other components within normal limits  COMPREHENSIVE METABOLIC PANEL - Abnormal; Notable for the following:    Glucose, Bld 123 (*)    BUN 41 (*)    Calcium 8.6 (*)    Albumin 3.4 (*)    Total Bilirubin 1.7 (*)    All other components within normal  limits  CBC WITH DIFFERENTIAL/PLATELET - Abnormal; Notable for the following:    RBC 3.84 (*)    Hemoglobin 11.9 (*)    HCT 35.2 (*)    Platelets 128 (*)    Neutro Abs 7.3 (*)    Lymphs Abs 0.4 (*)    All other components within normal limits  TROPONIN I - Abnormal; Notable for the following:    Troponin I 0.06 (*)    All other components within normal limits  CULTURE, BLOOD (ROUTINE X 2)  CULTURE, BLOOD (ROUTINE X 2)  LACTIC ACID, PLASMA   ____________________________________________  EKG  ED ECG REPORT I, Rana Adorno, the attending physician, personally viewed and interpreted this ECG.  Date: 01/05/2016 EKG Time: 15:49 Rate: 82 Rhythm: normal sinus rhythm QRS Axis: normal Intervals: normal ST/T Wave abnormalities: normal Conduction Disturbances: none Narrative Interpretation: unremarkable  ____________________________________________  RADIOLOGY   Dg Chest 2 View  Result Date: 01/05/2016 CLINICAL DATA:  Cough and worsening shortness of breath earlier today. EXAM: CHEST  2 VIEW COMPARISON:  02/04/2014; 02/03/2014 FINDINGS: Grossly unchanged enlarged cardiac silhouette and mediastinal contours with atherosclerotic plaque within thoracic aorta. Post aortic valve replacement. Pulmonary vasculature appears less distinct than present examination with cephalization of flow. Interval development of a small potentially partially loculated left-sided effusion with associated worsening left basilar opacities. Pleural calcifications are again seen bilaterally. No pneumothorax. No acute osseus abnormalities. Moderate scoliotic curvature of the thoracolumbar spine. Mild-to-moderate degenerative change of the bilateral glenohumeral joints. IMPRESSION: 1. Findings worrisome for pulmonary edema with associated potentially partially loculated left-sided effusion and worsening left basilar opacities, atelectasis versus infiltrate. A follow-up chest radiograph in 3 to 4 weeks after treatment  is recommended to ensure resolution. 2. Re- demonstrated bilateral pleural calcifications compatible with prior asbestos exposure. Electronically Signed   By: Sandi Mariscal M.D.   On: 01/05/2016 16:40   Ct Chest Wo Contrast  Result Date: 01/05/2016 CLINICAL DATA:  Cough and shortness of breath for 2 days, respiratory distress, pulmonary edema and/or pneumonia on chest radiography, hypoxemia EXAM: CT CHEST WITHOUT CONTRAST TECHNIQUE: Multidetector CT imaging of the chest was performed following the standard protocol without IV contrast. COMPARISON:  Chest radiographs 01/05/2016 FINDINGS: Cardiovascular: Atherosclerotic calcifications at the aorta, coronary arteries, and proximal great vessels. Post TAVR. Minimal pericardial effusion. Aorta normal caliber. Mediastinum/Nodes: No  definite thoracic adenopathy, hilar assessment limited by lack of IV contrast. Esophagus unremarkable. Lungs/Pleura: Extensive calcified pleural plaques bilaterally consistent with asbestos exposure. BILATERAL pleural effusions, moderate RIGHT and small LEFT. Peribronchial thickening. Central pulmonary infiltrates question edema versus infection. Additional focal opacity RIGHT upper lobe 18 x 13 mm image 46 may represent more focal consolidation or edema though followup until resolution required to exclude underlying neoplasm. Several additional subtle areas of more focal opacity are seen particularly in the RIGHT upper lobe notably image 54. No pneumothorax. Compressive atelectasis of both lower lobes. Upper Abdomen: Large cyst LEFT lobe liver 6.5 x 5.2 cm image 145. Small cyst RIGHT lobe liver 7 mm diameter image 132. Musculoskeletal: Osseous demineralization. Significant rotatory dextroconvex scoliosis thoracic spine. Scattered degenerative changes thoracic spine. IMPRESSION: Extensive atherosclerotic disease including aortic atherosclerosis and coronary arterial calcification. Calcified pleural plaques bilaterally consistent with  asbestos exposure. Post TAVR. BILATERAL pulmonary infiltrates greatest in upper lobes which could represent edema or infection, with more focal areas of opacity particularly in the RIGHT upper lobe as described above; followup CT imaging recommended in 3 months to confirm resolution of these focal opacities and exclude tumor. BILATERAL pleural effusions RIGHT greater than LEFT. Hepatic cysts. Electronically Signed   By: Lavonia Dana M.D.   On: 01/05/2016 17:37    ____________________________________________   PROCEDURES  Procedure(s) performed:   Procedures   Critical Care performed: No ____________________________________________   INITIAL IMPRESSION / ASSESSMENT AND PLAN / ED COURSE  Pertinent labs & imaging results that were available during my care of the patient were reviewed by me and considered in my medical decision making (see chart for details).  given the patient's hypoxemia, cough, shortness of breath, I suspect that he has pneumonia.  I will evaluate with radiographs and standard lab workup.  He is ill-appearing but not in acute respiratory distress now that he is on oxygen.   Clinical Course  Value Comment By Time   Abnormal chest x-ray with pulmonary edema versus infiltrates.  I suspect infiltrates even though the patient does not have a leukocytosis given that he has no other signs of volume overload and in fact if anything he appears somewhat dry.  I will evaluate with a noncontrast chest CT. Hinda Kehr, MD 10/18 1700  Troponin I: (!!) 0.06 The patient has a slightly elevated troponin which I suspect represents demand ischemia given the multiple pulmonary opacities/infiltrates, the question of possible pulmonary edema (although this is not consistent with his physical exam), etc.  I will admit him to the hospital for further management.  He has no leukocytosis but was hypoxemic in the low 80s upon arrival by EMS and has required oxygen therapy in the emergency  department.  I believe he has acute infection in the setting of chronic lung disease secondary to the prior asbestos exposure. Hinda Kehr, MD 10/18 1758   He does not have a leukocytosis and does not meet septic criteria.  I am sending blood cultures and a lactic acid.  He will need admission for further evaluation, probable pulmonology consult, etc. Hinda Kehr, MD 10/18 1822    ____________________________________________  FINAL CLINICAL IMPRESSION(S) / ED DIAGNOSES  Final diagnoses:  Bilateral pulmonary infiltrates on chest x-ray  Community acquired pneumonia, unspecified laterality  Acute respiratory failure with hypoxemia ([place])  Demand ischemia ([place])     MEDICATIONS GIVEN DURING THIS VISIT:  Medications  azithromycin (ZITHROMAX) 500 mg in dextrose 5 % 250 mL IVPB (500 mg Intravenous New Bag/Given 01/05/16 1745)  cefTRIAXone (ROCEPHIN) IVPB 1 g (1 g Intravenous Given 01/05/16 1851)     NEW OUTPATIENT MEDICATIONS STARTED DURING THIS VISIT:  New Prescriptions   No medications on file    Modified Medications   No medications on file    Discontinued Medications   No medications on file     Note:  This document was prepared using Dragon voice recognition software and may include unintentional dictation errors.    Hinda Kehr, MD 01/05/16 [id number]

## 2016-01-05 NOTE — Progress Notes (Signed)
Breathing changes.  Sx started about 1-2 days ago.  More SOB, cough with sputum.  No fevers known.  No vomiting.  No diarrhea.  If he tries to move quickly, he'll get SOB.  No BLE edema.  Weight is up from prev, unclear if from fluid or inc in PO intake at baseline.    Meds, vitals, and allergies reviewed.   ROS: Per HPI unless specifically indicated in ROS section   Pulse ox usually ~88%RA here at clinic today, usually was upper 90s prev (at last OV was 98%).   sats up to ~95% or higher with 2L 02.   nad chronically ill appearing.  Mmm Neck supple, no LA rrr No focal dec in BS but chronic scoliosis noted abd soft Ext w/o edema.

## 2016-01-05 NOTE — ED Triage Notes (Addendum)
Pt via ems from PCP office with cough and sob x 2 days. Pt states he had "terrible" coughing spell today that made him call ems. Pt alert & oriented with NAD noted. No coughing during triage.   EMS reports O2 sats in 80s when they arrived. Pt placed on 2L O2

## 2016-01-06 LAB — EXPECTORATED SPUTUM ASSESSMENT W REFEX TO RESP CULTURE

## 2016-01-06 LAB — TROPONIN I: TROPONIN I: 0.06 ng/mL — AB (ref <0.03)

## 2016-01-06 LAB — EXPECTORATED SPUTUM ASSESSMENT W GRAM STAIN, RFLX TO RESP C

## 2016-01-06 LAB — STREP PNEUMONIAE URINARY ANTIGEN: Strep Pneumo Urinary Antigen: NEGATIVE

## 2016-01-06 MED ORDER — AZITHROMYCIN 500 MG PO TABS
500.0000 mg | ORAL_TABLET | ORAL | Status: DC
  Administered 2016-01-06 – 2016-01-11 (×6): 500 mg via ORAL
  Filled 2016-01-06 (×6): qty 1

## 2016-01-06 MED ORDER — ACETAMINOPHEN 325 MG PO TABS: 650.0000 mg | ORAL_TABLET | Freq: Four times a day (QID) | ORAL | Status: DC | PRN

## 2016-01-06 MED ORDER — OXYCODONE HCL 5 MG PO TABS: 10.0000 mg | ORAL_TABLET | Freq: Three times a day (TID) | ORAL | Status: DC | PRN

## 2016-01-06 NOTE — Progress Notes (Signed)
Slept well overnight; non-productive cough at this time; No acute distress noted; RR even/unlabored. Barbaraann Faster, RN 6:58 AM 01/06/2016

## 2016-01-06 NOTE — Progress Notes (Signed)
[facility] [place] NAME: Douglas Parker    MR#:  [id number]  DATE OF BIRTH:  Nov 26, 1927  SUBJECTIVE:  CHIEF COMPLAINT:   Chief Complaint  Patient presents with  . Shortness of Breath  . Cough    REVIEW OF SYSTEMS:   Review of Systems  Constitutional: Negative for chills and fever.  HENT: Negative for hearing loss.   Eyes: Negative for blurred vision, double vision and photophobia.  Respiratory: Positive for cough and shortness of breath. Negative for hemoptysis.   Cardiovascular: Negative for palpitations, orthopnea and leg swelling.  Gastrointestinal: Negative for abdominal pain, diarrhea and vomiting.  Genitourinary: Negative for dysuria and urgency.  Musculoskeletal: Negative for myalgias and neck pain.  Skin: Negative for rash.  Neurological: Negative for dizziness, focal weakness, seizures, weakness and headaches.  Psychiatric/Behavioral: Negative for memory loss. The patient does not have insomnia.     DRUG ALLERGIES:  No Known Allergies  VITALS:  Blood pressure (!) 147/71, pulse 78, temperature 98.7 F (37.1 C), temperature source Oral, resp. rate 20, height 5\' 8"  (1.727 m), weight 64.1 kg (141 lb 4.8 oz), SpO2 94 %.  PHYSICAL EXAMINATION:  GENERAL:  82 y.o.-year-old patient lying in the bed with no acute distress.  EYES: Pupils equal, round, reactive to light and accommodation. No scleral icterus. Extraocular muscles intact.  HEENT: Head atraumatic, normocephalic. Oropharynx and nasopharynx clear.  NECK:  Supple, no jugular venous distention. No thyroid enlargement, no tenderness.  LUNGS: decreased breath sounds bilaterally,no wheezing.Marland Kitchen  CARDIOVASCULAR: S1, S2 normal. No murmurs, rubs, or gallops.  ABDOMEN: Soft, nontender, nondistended. Bowel sounds present. No organomegaly or mass.  EXTREMITIES: No pedal edema, cyanosis, or clubbing.  NEUROLOGIC: Cranial nerves II through XII are intact. Muscle strength 5/5  in all extremities. Sensation intact. Gait not checked.  PSYCHIATRIC: The patient is alert and oriented x 3.  SKIN: No obvious rash, lesion, or ulcer.    LABORATORY PANEL:   CBC  Recent Labs Lab 01/05/16 1555  WBC 8.4  HGB 11.9*  HCT 35.2*  PLT 128*   ------------------------------------------------------------------------------------------------------------------  Chemistries   Recent Labs Lab 01/05/16 1555  NA 142  K 4.1  CL 107  CO2 29  GLUCOSE 123*  BUN 41*  CREATININE 1.03  CALCIUM 8.6*  AST 28  ALT 41  ALKPHOS 57  BILITOT 1.7*   ------------------------------------------------------------------------------------------------------------------  Cardiac Enzymes  Recent Labs Lab 01/05/16 2335  TROPONINI 0.06*   ------------------------------------------------------------------------------------------------------------------  RADIOLOGY:  Dg Chest 2 View  Result Date: 01/05/2016 CLINICAL DATA:  Cough and worsening shortness of breath earlier today. EXAM: CHEST  2 VIEW COMPARISON:  02/04/2014; 02/03/2014 FINDINGS: Grossly unchanged enlarged cardiac silhouette and mediastinal contours with atherosclerotic plaque within thoracic aorta. Post aortic valve replacement. Pulmonary vasculature appears less distinct than present examination with cephalization of flow. Interval development of a small potentially partially loculated left-sided effusion with associated worsening left basilar opacities. Pleural calcifications are again seen bilaterally. No pneumothorax. No acute osseus abnormalities. Moderate scoliotic curvature of the thoracolumbar spine. Mild-to-moderate degenerative change of the bilateral glenohumeral joints. IMPRESSION: 1. Findings worrisome for pulmonary edema with associated potentially partially loculated left-sided effusion and worsening left basilar opacities, atelectasis versus infiltrate. A follow-up chest radiograph in 3 to 4 weeks after treatment  is recommended to ensure resolution. 2. Re- demonstrated bilateral pleural calcifications compatible with prior asbestos exposure. Electronically Signed   By: Sandi Mariscal M.D.   On: 01/05/2016 16:40   Ct Chest Wo Contrast  Result Date: 01/05/2016 CLINICAL DATA:  Cough and shortness of breath for 2 days, respiratory distress, pulmonary edema and/or pneumonia on chest radiography, hypoxemia EXAM: CT CHEST WITHOUT CONTRAST TECHNIQUE: Multidetector CT imaging of the chest was performed following the standard protocol without IV contrast. COMPARISON:  Chest radiographs 01/05/2016 FINDINGS: Cardiovascular: Atherosclerotic calcifications at the aorta, coronary arteries, and proximal great vessels. Post TAVR. Minimal pericardial effusion. Aorta normal caliber. Mediastinum/Nodes: No definite thoracic adenopathy, hilar assessment limited by lack of IV contrast. Esophagus unremarkable. Lungs/Pleura: Extensive calcified pleural plaques bilaterally consistent with asbestos exposure. BILATERAL pleural effusions, moderate RIGHT and small LEFT. Peribronchial thickening. Central pulmonary infiltrates question edema versus infection. Additional focal opacity RIGHT upper lobe 18 x 13 mm image 46 may represent more focal consolidation or edema though followup until resolution required to exclude underlying neoplasm. Several additional subtle areas of more focal opacity are seen particularly in the RIGHT upper lobe notably image 54. No pneumothorax. Compressive atelectasis of both lower lobes. Upper Abdomen: Large cyst LEFT lobe liver 6.5 x 5.2 cm image 145. Small cyst RIGHT lobe liver 7 mm diameter image 132. Musculoskeletal: Osseous demineralization. Significant rotatory dextroconvex scoliosis thoracic spine. Scattered degenerative changes thoracic spine. IMPRESSION: Extensive atherosclerotic disease including aortic atherosclerosis and coronary arterial calcification. Calcified pleural plaques bilaterally consistent with  asbestos exposure. Post TAVR. BILATERAL pulmonary infiltrates greatest in upper lobes which could represent edema or infection, with more focal areas of opacity particularly in the RIGHT upper lobe as described above; followup CT imaging recommended in 3 months to confirm resolution of these focal opacities and exclude tumor. BILATERAL pleural effusions RIGHT greater than LEFT. Hepatic cysts. Electronically Signed   By: Lavonia Dana M.D.   On: 01/05/2016 17:37    EKG:   Orders placed or performed during the hospital encounter of 01/05/16  . ED EKG  . ED EKG    ASSESSMENT AND PLAN:   #1 community-acquired pneumonia on the  right lung: Continue IV Rocephin, Zithromax, nebulizers. #2 acute respiratory failure secondary to come bilateral pleural effusion,  Right sided pneumonia: Patient has pleural plaques with asbestos exposure.continue o2. #3. History of BPH continue Finasteride History of chronic diastolic heart failure: The cardiogram done in April of this year showed EF 55-60%. No signs of fluid overload. Number hyperlipidemia continue statins Neuropathy continue Neurontin Deconditioning: Physical therapy consult. D/w nurse  All the records are reviewed and case discussed with Care Management/Social Workerr. Management plans discussed with the patient, family and they are in agreement.  CODE STATUS: Full code  TOTAL TIME TAKING CARE OF THIS PATIENT: 25minutes.   POSSIBLE D/C IN 1-2 DAYS, DEPENDING ON CLINICAL CONDITION.   Epifanio Lesches M.D on 01/06/2016 at 9:58 AM  Between 7am to 6pm - Pager - [phone]  After 6pm go to [email or web address] - password [email or web address]  Office  [phone]  CC: Primary care physician; Elsie Stain, MD   Note: This dictation was prepared with Dragon dictation along with smaller phrase technology. Any transcriptional errors that result from this process are unintentional.

## 2016-01-06 NOTE — Evaluation (Signed)
Physical Therapy Evaluation Patient Details Name: Douglas Surrency Gomm Sr. MRN: [id number] DOB: March 05, 1928 Today's Date: 01/06/2016   History of Present Illness  Pt admitted for pneumonia. Pt with complaints of cough and SOB symptoms. Pt with history of HNT, aortic valve stenosis, and prior asbestos exposure.  Clinical Impression  Pt is a pleasant 82 year old male who was admitted for pneumonia. Pt performs bed mobility with mod indep, transfers with cga, and ambulation with cga and RW. At baseline, pt does not use AD, however feels weak and SOB this date, recommend using RW at this time. All mobility performed on room air, unable to achieve accurate pulse ox reading, however pt fatigues quickly with increased endurance, 2L of O2 returned back on nose. Pt demonstrates deficits with strength/endurance/mobility. Would benefit from skilled PT to address above deficits and promote optimal return to PLOF. Recommend transition to [place] upon discharge from acute hospitalization.       Follow Up Recommendations Home health PT    Equipment Recommendations  None recommended by PT    Recommendations for Other Services       Precautions / Restrictions Precautions Precautions: Fall Restrictions Weight Bearing Restrictions: No      Mobility  Bed Mobility Overal bed mobility: Modified Independent             General bed mobility comments: safe technique performed with use of bed rails. Once seated at EOB, able to sit with supervision  Transfers Overall transfer level: Needs assistance Equipment used: Rolling walker (2 wheeled) Transfers: Sit to/from Stand Sit to Stand: Min guard         General transfer comment: Pt cued for pushing from seated surface and standing with kyphotic posture and apparent scoliosis.   Ambulation/Gait Ambulation/Gait assistance: Min guard Ambulation Distance (Feet): 80 Feet Assistive device: Rolling walker (2 wheeled) Gait Pattern/deviations: Step-through  pattern     General Gait Details: ambulated using reciprocal gait pattern and slow gait speed. Pt fatigues with increased ambulation and requests to return back to room. All mobility performed while on room air with pulse ox unable to read. 2L of O2 applied to ease SOB symptoms.   Stairs            Wheelchair Mobility    Modified Rankin (Stroke Patients Only)       Balance Overall balance assessment: Needs assistance Sitting-balance support: Feet supported Sitting balance-Leahy Scale: Normal     Standing balance support: Bilateral upper extremity supported Standing balance-Leahy Scale: Good                               Pertinent Vitals/Pain Pain Assessment: No/denies pain    Home Living Family/patient expects to be discharged to:: Private residence Living Arrangements: Spouse/significant other Available Help at Discharge: Family;Available 24 hours/day Type of Home: House Home Access: Ramped entrance     Home Layout: One level Home Equipment: Walker - 2 wheels;Cane - single point      Prior Function Level of Independence: Independent         Comments: limited household ambulator, occasionally used SPC for mobility, still driving     Hand Dominance        Extremity/Trunk Assessment   Upper Extremity Assessment: Overall WFL for tasks assessed           Lower Extremity Assessment: Generalized weakness (grossly B LE 4/5)         Communication   Communication:  No difficulties  Cognition Arousal/Alertness: Awake/alert Behavior During Therapy: WFL for tasks assessed/performed Overall Cognitive Status: Within Functional Limits for tasks assessed                      General Comments      Exercises     Assessment/Plan    PT Assessment Patient needs continued PT services  PT Problem List Decreased strength;Decreased activity tolerance;Decreased balance;Decreased mobility;Cardiopulmonary status limiting activity           PT Treatment Interventions DME instruction;Gait training;Therapeutic exercise    PT Goals (Current goals can be found in the Care Plan section)  Acute Rehab PT Goals Patient Stated Goal: to get stronger PT Goal Formulation: With patient Time For Goal Achievement: 01/20/16 Potential to Achieve Goals: Good    Frequency Min 2X/week   Barriers to discharge        Co-evaluation               End of Session Equipment Utilized During Treatment: Gait belt Activity Tolerance: Patient limited by fatigue Patient left: in chair;with chair alarm set Nurse Communication: Mobility status         Time: [id number] PT Time Calculation (min) (ACUTE ONLY): 23 min   Charges:   PT Evaluation $PT Eval Moderate Complexity: 1 Procedure     PT G Codes:        Michelene Keniston 01-30-2016, 4:54 PM  Greggory Stallion, PT, DPT [phone]

## 2016-01-06 NOTE — Progress Notes (Signed)
Dr. Jannifer Franklin notified of elevated troponin 0.06 (2nd draw); Barbaraann Faster, RN 12:35 AM 01/06/2016

## 2016-01-07 ENCOUNTER — Inpatient Hospital Stay: Payer: Medicare Other

## 2016-01-07 ENCOUNTER — Telehealth: Payer: Self-pay

## 2016-01-07 LAB — HIV ANTIBODY (ROUTINE TESTING W REFLEX): HIV Screen 4th Generation wRfx: NONREACTIVE

## 2016-01-07 MED ORDER — IPRATROPIUM-ALBUTEROL 0.5-2.5 (3) MG/3ML IN SOLN
3.0000 mL | Freq: Three times a day (TID) | RESPIRATORY_TRACT | Status: DC
  Administered 2016-01-07 – 2016-01-12 (×13): 3 mL via RESPIRATORY_TRACT
  Filled 2016-01-07 (×15): qty 3

## 2016-01-07 MED ORDER — FUROSEMIDE 8 MG/ML PO SOLN: 20.0000 mg | Freq: Once | ORAL | Status: DC

## 2016-01-07 MED ORDER — FUROSEMIDE 20 MG PO TABS
20.0000 mg | ORAL_TABLET | Freq: Once | ORAL | Status: AC
  Administered 2016-01-07: 20 mg via ORAL
  Filled 2016-01-07: qty 1

## 2016-01-07 NOTE — Progress Notes (Signed)
SATURATION QUALIFICATIONS: (This note is used to comply with regulatory documentation for home oxygen)  Patient Saturations on Room Air at Rest = 93%  Patient Saturations on Room Air while Ambulating = 88%  Patient Saturations on 4 Liters of oxygen while Ambulating = 90%  RN had to use oxygen saturation probe on ear lobe. False reading on fingers and toes. Patient not able to maintain oxygen saturation above 90% without oxygen while ambulating.   Deri Fuelling, RN

## 2016-01-07 NOTE — Progress Notes (Signed)
Patient oxygen saturation 90-92% on 2L O2. Patient was sating 97% on 2L O2 yesterday. MD notified. Ordered Chest x ray, lasix, and 5L O2 nasal cannula.  Deri Fuelling, RN

## 2016-01-07 NOTE — Progress Notes (Signed)
Date: 01/07/2016,   MRN# [id number] Douglas Parker Samek Sr. 28-Sep-1927 Code Status:     Code Status Orders        Start     Ordered   01/05/16 2240  Full code  Continuous     01/05/16 2239    Code Status History    Date Active Date Inactive Code Status Order ID Comments User Context   02/03/2014  2:18 PM 02/06/2014  5:53 PM Full Code [id number]  Sherren Mocha, MD Inpatient    Referring MD: @ATDPROV @     Hosp day:@LENGTHOFSTAYDAYS @     CC: pneumonia, ILD  HPI: pt seen, full note to follow  PMHX:   Past Medical History:  Diagnosis Date  . Aortic valve stenosis 09/10/2012  . Arthritis   . Back pain   . Benign prostatic hypertrophy 10/18/00  . Cancer ([place])    skin cancers  . CHF (congestive heart failure) ([name]) 04/21/03  . Chronic diastolic congestive heart failure ([place])   . Colon polyps 06/05/2002   path could not be found   . Diverticulosis of colon 06/05/2002  . Hyperlipidemia 09/05/95  . Hypertension 05/19/03  . 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 Scoliosis   . Thinning of skin    bruises easy.   Surgical Hx:  Past Surgical History:  Procedure Laterality Date  . CARDIAC CATHETERIZATION  10/27/2013  . COLONOSCOPY W/ BIOPSIES  05/2002  . EYE SURGERY     bilateral cataracts  . FLEXIBLE SIGMOIDOSCOPY  07/2005  . INTRAOPERATIVE TRANSESOPHAGEAL ECHOCARDIOGRAM N/A 02/03/2014   Procedure: INTRAOPERATIVE TRANSESOPHAGEAL ECHOCARDIOGRAM;  Surgeon: Sherren Mocha, MD;  [facility];  Service: Open Heart Surgery;  Laterality: N/A;  . KNEE ARTHROSCOPY  06/1997   right  . TEE WITHOUT CARDIOVERSION    . TONSILLECTOMY    . TRANSCATHETER AORTIC VALVE REPLACEMENT, TRANSFEMORAL N/A 02/03/2014   Procedure: TRANSCATHETER AORTIC VALVE REPLACEMENT, TRANSFEMORAL;  Surgeon: Sherren Mocha, MD;  Location: [facility];  Service: Open Heart Surgery;  Laterality: N/A;   Family Hx:   Family History  Problem Relation Age of Onset  . Stroke Father   . Diabetes Brother   . Hip fracture Sister     after a fall  . Diabetes Brother   . Coronary artery disease Brother     carotid stenosis  . Hypertension Other   . Colon cancer Neg Hx   . Prostate cancer Neg Hx    Social Hx:   Social History  Substance Use Topics  . Smoking status: Former Smoker    Packs/day: 1.00    Years: 20.00    Types: Cigarettes    Quit date: 01/11/1971  . Smokeless tobacco: Former Systems developer     Comment: quit 40 years ago  . Alcohol use No   Medication:    Home Medication:    Current Medication: @CURMEDTAB @   Allergies:  Review of patient's allergies indicates no known allergies.  Review of Systems: Gen:  Denies  fever, sweats, chills HEENT: Denies blurred vision, double vision, ear pain, eye pain, hearing loss, nose bleeds, sore throat Cvc:  No dizziness, chest pain or heaviness Resp: 02 on, sob, wheezing at times, cough, no hemoptysis mentioned Gi: Denies swallowing difficulty, stomach pain, nausea or vomiting, diarrhea, constipation, bowel incontinence Gu:  Denies bladder incontinence, burning urine Ext:   No Joint pain, stiffness or swelling Skin: No skin rash, easy bruising  or bleeding or hives Endoc:  No polyuria, polydipsia , polyphagia or weight change Psych: No depression, insomnia or hallucinations  Other:  All other systems negative  Physical Examination:   VS: BP (!) 147/65 (BP Location: Left Arm)   Pulse 77   Temp 98.1 F (36.7 C) (Oral)   Resp 18   Ht 5\' 8"  (1.727 m)   Wt 141 lb 4.8 oz (64.1 kg)   SpO2 98%   BMI 21.48 kg/m   General Appearance: No distress, [place] 02 on, frial   Neuro: without focal findings, mental status, speech normal, alert and oriented, cranial nerves 2-12 intact, reflexes normal and symmetric, sensation grossly normal  HEENT: PERRLA, EOM intact, no ptosis, no other lesions noticed: Pulmonary:.No wheezing, No rales  COURSE.    Cardiovascular:  Normal S1,S2. Systolic murmur along the LSB   Abdomen:Benign, Soft, non-tender, No masses, hepatosplenomegaly, No lymphadenopathy Endoc: No evident thyromegaly, no signs of acromegaly or Cushing features Skin:   warm, no rashes, no ecchymosis  Extremities: normal, no cyanosis, clubbing, no edema, warm with normal capillary refill. Other findings:   Labs results:   Recent Labs     01/05/16  1555  HGB  11.9*  HCT  35.2*  MCV  91.6  WBC  8.4  BUN  41*  CREATININE  1.03  GLUCOSE  123*  CALCIUM  8.6*  ,      Rad results:   Dg Chest 2 View  Result Date: 01/07/2016 CLINICAL DATA:  Hypoxia and shortness of breath. EXAM: CHEST  2 VIEW COMPARISON:  01/05/2016 CT and chest radiograph.  Prior studies FINDINGS: Cardiomegaly, aortic valve replacement and bilateral pleural effusions again noted. Slightly decreased pulmonary edema noted. Slightly increased bibasilar atelectasis noted. There is no evidence of pneumothorax. IMPRESSION: Slightly increased bibasilar atelectasis. Slightly improved interstitial edema. Unchanged cardiomegaly and bilateral pleural effusions. Electronically Signed   By: Margarette Canada M.D.   On: 01/07/2016 13:03   CT CHEST WITHOUT CONTRAST  TECHNIQUE: Multidetector CT imaging of the chest was performed following the standard protocol without IV contrast.  COMPARISON:  Chest radiographs 01/05/2016  FINDINGS: Cardiovascular: Atherosclerotic calcifications at the aorta, coronary arteries, and proximal great vessels. Post TAVR. Minimal pericardial effusion. Aorta normal caliber.  Mediastinum/Nodes: No definite thoracic adenopathy, hilar assessment limited by lack of IV contrast. Esophagus unremarkable.  Lungs/Pleura: Extensive calcified pleural plaques bilaterally consistent with asbestos exposure. BILATERAL pleural effusions, moderate RIGHT and small LEFT. Peribronchial thickening. Central pulmonary infiltrates question edema versus  infection. Additional focal opacity RIGHT upper lobe 18 x 13 mm image 46 may represent more focal consolidation or edema though followup until resolution required to exclude underlying neoplasm. Several additional subtle areas of more focal opacity are seen particularly in the RIGHT upper lobe notably image 54. No pneumothorax. Compressive atelectasis of both lower lobes.  Upper Abdomen: Large cyst LEFT lobe liver 6.5 x 5.2 cm image 145. Small cyst RIGHT lobe liver 7 mm diameter image 132.  Musculoskeletal: Osseous demineralization. Significant rotatory dextroconvex scoliosis thoracic spine. Scattered degenerative changes thoracic spine.  IMPRESSION: Extensive atherosclerotic disease including aortic atherosclerosis and coronary arterial calcification.  Calcified pleural plaques bilaterally consistent with asbestos exposure.  Post TAVR.  BILATERAL pulmonary infiltrates greatest in upper lobes which could represent edema or infection, with more focal areas of opacity particularly in the RIGHT upper lobe as described above; followup CT imaging recommended in 3 months to confirm resolution of these focal opacities and exclude tumor.  BILATERAL pleural effusions RIGHT greater than  LEFT.  Hepatic cysts.   Electronically Signed   By: Lavonia Dana M.D.   On: 01/05/2016 17:37  PHYSICIAN INTERPRETATION:  Left Ventricle: Left ventricular ejection fraction, by visual estimation,  is  50 to 55%.  Right Ventricle: Normal right ventricular size, wall thickness, and  systolic  function. The right ventricular size is normal. Global RV systolic  function  is normal.  Left Atrium: The left atrium is mildly dilated.  Right Atrium: The right atrium is mildly dilated.  Pericardium: There is no evidence of pericardial effusion.  Mitral Valve: The mitral valve is normal in structure. Mild mitral valve  regurgitation is seen.  Tricuspid Valve: The tricuspid valve is normal.  Trivial tricuspid  regurgitation is visualized.  Aortic Valve: The aortic valve is tricuspid. Severe aortic stenosis is  present. Trivial aortic valve regurgitation is seen.  Pulmonic Valve: The pulmonic valve is not well seen. Mild pulmonic valve  regurgitation.  Shunts: Agitated saline contrast was given intravenously to evaluate for  intracardiac shunting. Saline contrast bubble study was negative, with no  evidence of any intracardiac shunt.     Assessment and Plan: -Community acquired pneumonia -Asbestosis  -Bilateral pleural effusions ?  chf -Severe aortic stenosis  Continue present antibiotic coverage Check echo Wean o2 as tolerated Physical therapy      I have personally obtained a history, examined the patient, evaluated laboratory and imaging results, formulated the assessment and plan and placed orders.  The Patient requires high complexity decision making for assessment and support, frequent evaluation and titration of therapies, application of advanced monitoring technologies and extensive interpretation of multiple databases.   Jahna Liebert,M.D. Pulmonary & Critical care Medicine [facility]

## 2016-01-07 NOTE — Progress Notes (Signed)
Physical Therapy Treatment Patient Details Name: Douglas Crunk Lueth Sr. MRN: [id number] DOB: 1927-04-09 Today's Date: 01/07/2016    History of Present Illness Pt admitted for pneumonia. Pt with complaints of cough and SOB symptoms. Pt with history of HNT, aortic valve stenosis, and prior asbestos exposure.    PT Comments    Pt is making good progress towards goals with increased ambulation distance this session. All mobility performed on 2L of O2 without limited SOB symptoms noted. Unable to obtain accurate pulse ox reading. Good endurance with there-ex this date.  Follow Up Recommendations  Home health PT     Equipment Recommendations  None recommended by PT    Recommendations for Other Services       Precautions / Restrictions Precautions Precautions: Fall Restrictions Weight Bearing Restrictions: No    Mobility  Bed Mobility Overal bed mobility: Modified Independent             General bed mobility comments: safe technique performed with use of bed rails. Once seated at EOB, able to sit with supervision  Transfers Overall transfer level: Needs assistance Equipment used: Rolling walker (2 wheeled) Transfers: Sit to/from Stand Sit to Stand: Min guard         General transfer comment: safe technique performed with transfer training. Pt able to push from seated surface and stand with RW. Kyphtoic posture noted  Ambulation/Gait Ambulation/Gait assistance: Min guard Ambulation Distance (Feet): 130 Feet Assistive device: Rolling walker (2 wheeled) Gait Pattern/deviations: Step-through pattern     General Gait Details: ambulated using reciprocal gait pattern and slow speed. Pt fatigues quickly and requests to return back to room. All mobility performed while on 2L of O2 with no SOB symptoms noted. Unable to get accurate pulse ox reading on fingers this date.   Stairs            Wheelchair Mobility    Modified Rankin (Stroke Patients Only)        Balance                                    Cognition Arousal/Alertness: Awake/alert Behavior During Therapy: WFL for tasks assessed/performed Overall Cognitive Status: Within Functional Limits for tasks assessed                      Exercises Other Exercises Other Exercises: supine ther-ex performed on B LE including ankle pumps, quad sets, SLRs, hip ab/add, and SAQ. All ther-ex performed x 10 reps with cga and cues for correct technique. Slight SOB symptoms noted with increased endurance    General Comments        Pertinent Vitals/Pain Pain Assessment: No/denies pain    Home Living                      Prior Function            PT Goals (current goals can now be found in the care plan section) Acute Rehab PT Goals Patient Stated Goal: to get stronger PT Goal Formulation: With patient Time For Goal Achievement: 01/20/16 Potential to Achieve Goals: Good Progress towards PT goals: Progressing toward goals    Frequency    Min 2X/week      PT Plan Current plan remains appropriate    Co-evaluation             End of Session Equipment Utilized During Treatment: Gait belt  Activity Tolerance: Patient limited by fatigue Patient left: in bed;with bed alarm set (refused to sit in recliner this date)     Time: [id number] PT Time Calculation (min) (ACUTE ONLY): 23 min  Charges:  $Gait Training: 8-22 mins $Therapeutic Exercise: 8-22 mins                    G Codes:      Manjit Bufano Jan 10, 2016, 4:09 PM  Greggory Stallion, PT, DPT [phone]

## 2016-01-07 NOTE — Progress Notes (Addendum)
Patient on 5L O2 with 91% oxygen saturation. MD made aware. RN helped patient practice some cough and deep breathing exercises.   Deri Fuelling, RN

## 2016-01-07 NOTE — Care Management Note (Signed)
Case Management Note  Patient Details  Name: Douglas Uzzle Dombeck Sr. MRN: [id number] Date of Birth: 21-May-1927  Subjective/Objective:                  Met with patient to discuss discharge planning. He is from home where he was independent with mobility and lives with wife. Both drive. Both smoke. His PCP is with [facility]- Dr. Damita Dunnings. He has a cane available but no walker. O2 is acute.    Action/Plan:  O2 evaluation requested from RN and from [place] [facility]. Home health list left with patient.   Expected Discharge Date:                  Expected Discharge Plan:     In-House Referral:     Discharge planning Services  CM Consult  Post Acute Care Choice:  Home Health, Durable Medical Equipment Choice offered to:  Patient  DME Arranged:  Oxygen DME Agency:  [place]:    [place] Agency:     Status of Service:  In process, will continue to follow  If discussed at Long Length of Stay Meetings, dates discussed:    Additional Comments:  Marshell Garfinkel, RN 01/07/2016, 11:29 AM

## 2016-01-07 NOTE — Telephone Encounter (Signed)
Douglas Parker(do not see DPR signed) left v/m wanting to know if Dr Damita Dunnings can call or visit pt and check on pts progress while in [facility].

## 2016-01-07 NOTE — Progress Notes (Signed)
Patient oxygen saturation 98% on 2LO2 with ear lobe probe.  Deri Fuelling, RN

## 2016-01-07 NOTE — Progress Notes (Signed)
[place] at [place] NAME: Douglas Parker    MR#:  [id number]  DATE OF BIRTH:  [birth year]  SUBJECTIVE:seen at bedside.he says he feels little better.less cough. 02 sats on regular dinamap 91%on 5 litres but with earlobe o2 sats 97% on 2litres.pt not in distress.   CHIEF COMPLAINT:   Chief Complaint  Patient presents with  . Shortness of Breath  . Cough    REVIEW OF SYSTEMS:   Review of Systems  Constitutional: Negative for chills and fever.  HENT: Negative for hearing loss.   Eyes: Negative for blurred vision, double vision and photophobia.  Respiratory: Positive for cough and shortness of breath. Negative for hemoptysis.   Cardiovascular: Negative for palpitations, orthopnea and leg swelling.  Gastrointestinal: Negative for abdominal pain, diarrhea and vomiting.  Genitourinary: Negative for dysuria and urgency.  Musculoskeletal: Negative for myalgias and neck pain.  Skin: Negative for rash.  Neurological: Negative for dizziness, focal weakness, seizures, weakness and headaches.  Psychiatric/Behavioral: Negative for memory loss. The patient does not have insomnia.     DRUG ALLERGIES:  No Known Allergies  VITALS:  Blood pressure (!) 147/65, pulse 77, temperature 98.1 F (36.7 C), temperature source Oral, resp. rate 18, height 5\' 8"  (1.727 m), weight 64.1 kg (141 lb 4.8 oz), SpO2 98 %.  PHYSICAL EXAMINATION:  GENERAL:  82 y.o.-year-old patient lying in the bed with no acute distress.  EYES: Pupils equal, round, reactive to light and accommodation. No scleral icterus. Extraocular muscles intact.  HEENT: Head atraumatic, normocephalic. Oropharynx and nasopharynx clear.  NECK:  Supple, no jugular venous distention. No thyroid enlargement, no tenderness.  LUNGS: decreased breath sounds bilaterally,no wheezing.Marland Kitchen  CARDIOVASCULAR: S1, S2 normal. No murmurs, rubs, or gallops.  ABDOMEN: Soft, nontender, nondistended. Bowel sounds  present. No organomegaly or mass.  EXTREMITIES: No pedal edema, cyanosis, or clubbing.  NEUROLOGIC: Cranial nerves II through XII are intact. Muscle strength 5/5 in all extremities. Sensation intact. Gait not checked.  PSYCHIATRIC: The patient is alert and oriented x 3.  SKIN: No obvious rash, lesion, or ulcer.    LABORATORY PANEL:   CBC  Recent Labs Lab 01/05/16 1555  WBC 8.4  HGB 11.9*  HCT 35.2*  PLT 128*   ------------------------------------------------------------------------------------------------------------------  Chemistries   Recent Labs Lab 01/05/16 1555  NA 142  K 4.1  CL 107  CO2 29  GLUCOSE 123*  BUN 41*  CREATININE 1.03  CALCIUM 8.6*  AST 28  ALT 41  ALKPHOS 57  BILITOT 1.7*   ------------------------------------------------------------------------------------------------------------------  Cardiac Enzymes  Recent Labs Lab 01/05/16 2335  TROPONINI 0.06*   ------------------------------------------------------------------------------------------------------------------  RADIOLOGY:  Dg Chest 2 View  Result Date: 01/05/2016 CLINICAL DATA:  Cough and worsening shortness of breath earlier today. EXAM: CHEST  2 VIEW COMPARISON:  02/04/2014; 02/03/2014 FINDINGS: Grossly unchanged enlarged cardiac silhouette and mediastinal contours with atherosclerotic plaque within thoracic aorta. Post aortic valve replacement. Pulmonary vasculature appears less distinct than present examination with cephalization of flow. Interval development of a small potentially partially loculated left-sided effusion with associated worsening left basilar opacities. Pleural calcifications are again seen bilaterally. No pneumothorax. No acute osseus abnormalities. Moderate scoliotic curvature of the thoracolumbar spine. Mild-to-moderate degenerative change of the bilateral glenohumeral joints. IMPRESSION: 1. Findings worrisome for pulmonary edema with associated potentially  partially loculated left-sided effusion and worsening left basilar opacities, atelectasis versus infiltrate. A follow-up chest radiograph in 3 to 4 weeks after treatment is recommended to ensure resolution. 2. Re- demonstrated  bilateral pleural calcifications compatible with prior asbestos exposure. Electronically Signed   By: Sandi Mariscal M.D.   On: 01/05/2016 16:40   Ct Chest Wo Contrast  Result Date: 01/05/2016 CLINICAL DATA:  Cough and shortness of breath for 2 days, respiratory distress, pulmonary edema and/or pneumonia on chest radiography, hypoxemia EXAM: CT CHEST WITHOUT CONTRAST TECHNIQUE: Multidetector CT imaging of the chest was performed following the standard protocol without IV contrast. COMPARISON:  Chest radiographs 01/05/2016 FINDINGS: Cardiovascular: Atherosclerotic calcifications at the aorta, coronary arteries, and proximal great vessels. Post TAVR. Minimal pericardial effusion. Aorta normal caliber. Mediastinum/Nodes: No definite thoracic adenopathy, hilar assessment limited by lack of IV contrast. Esophagus unremarkable. Lungs/Pleura: Extensive calcified pleural plaques bilaterally consistent with asbestos exposure. BILATERAL pleural effusions, moderate RIGHT and small LEFT. Peribronchial thickening. Central pulmonary infiltrates question edema versus infection. Additional focal opacity RIGHT upper lobe 18 x 13 mm image 46 may represent more focal consolidation or edema though followup until resolution required to exclude underlying neoplasm. Several additional subtle areas of more focal opacity are seen particularly in the RIGHT upper lobe notably image 54. No pneumothorax. Compressive atelectasis of both lower lobes. Upper Abdomen: Large cyst LEFT lobe liver 6.5 x 5.2 cm image 145. Small cyst RIGHT lobe liver 7 mm diameter image 132. Musculoskeletal: Osseous demineralization. Significant rotatory dextroconvex scoliosis thoracic spine. Scattered degenerative changes thoracic spine.  IMPRESSION: Extensive atherosclerotic disease including aortic atherosclerosis and coronary arterial calcification. Calcified pleural plaques bilaterally consistent with asbestos exposure. Post TAVR. BILATERAL pulmonary infiltrates greatest in upper lobes which could represent edema or infection, with more focal areas of opacity particularly in the RIGHT upper lobe as described above; followup CT imaging recommended in 3 months to confirm resolution of these focal opacities and exclude tumor. BILATERAL pleural effusions RIGHT greater than LEFT. Hepatic cysts. Electronically Signed   By: Lavonia Dana M.D.   On: 01/05/2016 17:37    EKG:   Orders placed or performed during the hospital encounter of 01/05/16  . ED EKG  . ED EKG    ASSESSMENT AND PLAN:   #1 community-acquired pneumonia on the  right lung: Continue IV Rocephin, Zithromax, nebulizers.Clinically improving but not back to baseline. Still requiring oxygen 2 L. #2 acute respiratory failure secondary to come bilateral pleural effusion,  Right sided pneumonia: Patient has pleural plaques with asbestos exposure.continue o2. #3. History of BPH continue Finasteride History of chronic diastolic heart failure: The cardiogram done in April of this year showed EF 55-60%. No signs of fluid overload. Number hyperlipidemia continue statins Neuropathy continue Neurontin Deconditioning: Physical therapy onsulted and recommended home health physical therapy  D/w nurse  All the records are reviewed and case discussed with Care Management/Social Workerr. Management plans discussed with the patient, family and they are in agreement.  CODE STATUS: Full code  TOTAL TIME TAKING CARE OF THIS PATIENT: 76minutes.   POSSIBLE D/C IN 1-2 DAYS, DEPENDING ON CLINICAL CONDITION.   Epifanio Lesches M.D on 01/07/2016 at 10:22 AM  Between 7am to 6pm - Pager - [phone]  After 6pm go to [email or web address] - password [email or web address] [facility]   Office  [phone]  CC: Primary care physician; Elsie Stain, MD   Note: This dictation was prepared with Dragon dictation along with smaller phrase technology. Any transcriptional errors that result from this process are unintentional.

## 2016-01-07 NOTE — Progress Notes (Signed)
RN assessed patient's need for O2. Patient not able to stay above 90% O2 saturation without oxygen. Patient currently 90-92% O2 saturation with 3L O2. Patient may need O2 at home.   Deri Fuelling, RN

## 2016-01-08 ENCOUNTER — Inpatient Hospital Stay
Admit: 2016-01-08 | Discharge: 2016-01-08 | Disposition: A | Discharge status: Home / Self Care | Payer: Medicare Other | Attending: Internal Medicine | Admitting: Internal Medicine

## 2016-01-08 ENCOUNTER — Encounter: Payer: Self-pay | Admitting: Radiology

## 2016-01-08 ENCOUNTER — Inpatient Hospital Stay: Payer: Medicare Other

## 2016-01-08 LAB — ECHOCARDIOGRAM COMPLETE
Height: 68 in
Weight: 2260.8 oz

## 2016-01-08 MED ORDER — IOPAMIDOL (ISOVUE-300) INJECTION 61%
75.0000 mL | Freq: Once | INTRAVENOUS | Status: AC | PRN
  Administered 2016-01-08: 75 mL via INTRAVENOUS

## 2016-01-08 MED ORDER — METHYLPREDNISOLONE SODIUM SUCC 125 MG IJ SOLR
60.0000 mg | INTRAMUSCULAR | Status: DC
  Administered 2016-01-08 – 2016-01-10 (×3): 60 mg via INTRAVENOUS
  Filled 2016-01-08 (×3): qty 2

## 2016-01-08 NOTE — Progress Notes (Signed)
[place] at [place] NAME: Douglas Parker    MR#:  [id number]  DATE OF BIRTH:  [birth year]  SUBJECTIVE; feels more short of breath. And has some cough also. Oxygen saturation 91% on 4 L.   CHIEF COMPLAINT:   Chief Complaint  Patient presents with  . Shortness of Breath  . Cough    REVIEW OF SYSTEMS:   Review of Systems  Constitutional: Negative for chills and fever.  HENT: Negative for hearing loss.   Eyes: Negative for blurred vision, double vision and photophobia.  Respiratory: Positive for cough and shortness of breath. Negative for hemoptysis.   Cardiovascular: Negative for palpitations, orthopnea and leg swelling.  Gastrointestinal: Negative for abdominal pain, diarrhea and vomiting.  Genitourinary: Negative for dysuria and urgency.  Musculoskeletal: Negative for myalgias and neck pain.  Skin: Negative for rash.  Neurological: Negative for dizziness, focal weakness, seizures, weakness and headaches.  Psychiatric/Behavioral: Negative for memory loss. The patient does not have insomnia.     DRUG ALLERGIES:  No Known Allergies  VITALS:  Blood pressure (!) 128/48, pulse 76, temperature 98.6 F (37 C), temperature source Oral, resp. rate 18, height 5\' 8"  (1.727 m), weight 64.1 kg (141 lb 4.8 oz), SpO2 91 %.  PHYSICAL EXAMINATION:  GENERAL:  82 y.o.-year-old patient lying in the bed with no acute distress.  EYES: Pupils equal, round, reactive to light and accommodation. No scleral icterus. Extraocular muscles intact.  HEENT: Head atraumatic, normocephalic. Oropharynx and nasopharynx clear.  NECK:  Supple, no jugular venous distention. No thyroid enlargement, no tenderness.  LUNGS: decreased breath sounds bilaterally,no wheezing.Marland Kitchen  CARDIOVASCULAR: S1, S2 normal. No murmurs, rubs, or gallops.  ABDOMEN: Soft, nontender, nondistended. Bowel sounds present. No organomegaly or mass.  EXTREMITIES: No pedal edema, cyanosis, or  clubbing.  NEUROLOGIC: Cranial nerves II through XII are intact. Muscle strength 5/5 in all extremities. Sensation intact. Gait not checked.  PSYCHIATRIC: The patient is alert and oriented x 3.  SKIN: No obvious rash, lesion, or ulcer.    LABORATORY PANEL:   CBC  Recent Labs Lab 01/05/16 1555  WBC 8.4  HGB 11.9*  HCT 35.2*  PLT 128*   ------------------------------------------------------------------------------------------------------------------  Chemistries   Recent Labs Lab 01/05/16 1555  NA 142  K 4.1  CL 107  CO2 29  GLUCOSE 123*  BUN 41*  CREATININE 1.03  CALCIUM 8.6*  AST 28  ALT 41  ALKPHOS 57  BILITOT 1.7*   ------------------------------------------------------------------------------------------------------------------  Cardiac Enzymes  Recent Labs Lab 01/05/16 2335  TROPONINI 0.06*   ------------------------------------------------------------------------------------------------------------------  RADIOLOGY:  Dg Chest 2 View  Result Date: 01/07/2016 CLINICAL DATA:  Hypoxia and shortness of breath. EXAM: CHEST  2 VIEW COMPARISON:  01/05/2016 CT and chest radiograph.  Prior studies FINDINGS: Cardiomegaly, aortic valve replacement and bilateral pleural effusions again noted. Slightly decreased pulmonary edema noted. Slightly increased bibasilar atelectasis noted. There is no evidence of pneumothorax. IMPRESSION: Slightly increased bibasilar atelectasis. Slightly improved interstitial edema. Unchanged cardiomegaly and bilateral pleural effusions. Electronically Signed   By: Margarette Canada M.D.   On: 01/07/2016 13:03    EKG:   Orders placed or performed during the hospital encounter of 01/05/16  . ED EKG  . ED EKG    ASSESSMENT AND PLAN:   #1 community-acquired pneumonia on the  right lung: he  feels worse today. Diminished lung sounds bilaterally. CT chest today, added IV steroids, continue nebulizers, antibiotics and see how he does .  #2  acute  respiratory failure secondary to come bilateral pleural effusion,  Right sided pneumonia: Patient has pleural plaques with asbestos exposure.continue o2.; Check echocardiogram as recommended by pulmonary.for eval of possible pleural effusion.. #3. History of BPH continue Finasteride History of chronic diastolic heart failure: echo cardiogram done in April of this year showed EF 55-60%. No signs of fluid overload. 4.r hyperlipidemia ;continue statins Neuropathy continue Neurontin Deconditioning: Physical therapy onsulted and recommended home health physical therapy  D/w nurse  All the records are reviewed and case discussed with Care Management/Social Workerr. Management plans discussed with the patient, family and they are in agreement.  CODE STATUS: Full code  TOTAL TIME TAKING CARE OF THIS PATIENT: 67minutes.   POSSIBLE D/C IN 1-2 DAYS, DEPENDING ON CLINICAL CONDITION.   Epifanio Lesches M.D on 01/08/2016 at 11:48 AM  Between 7am to 6pm - Pager - [phone]  After 6pm go to [email or web address] - password [email or web address] [facility]  Office  [phone]  CC: Primary care physician; Elsie Stain, MD   Note: This dictation was prepared with Dragon dictation along with smaller phrase technology. Any transcriptional errors that result from this process are unintentional.

## 2016-01-08 NOTE — Progress Notes (Signed)
*  PRELIMINARY RESULTS* Echocardiogram 2D Echocardiogram has been performed.  Douglas Parker 01/08/2016, 1:57 PM

## 2016-01-09 LAB — CBC
HEMATOCRIT: 31.8 % — AB (ref 40.0–52.0)
Hemoglobin: 11.1 g/dL — ABNORMAL LOW (ref 13.0–18.0)
MCH: 30.8 pg (ref 26.0–34.0)
MCHC: 35 g/dL (ref 32.0–36.0)
MCV: 88 fL (ref 80.0–100.0)
PLATELETS: 167 10*3/uL (ref 150–440)
RBC: 3.62 MIL/uL — ABNORMAL LOW (ref 4.40–5.90)
RDW: 13.1 % (ref 11.5–14.5)
WBC: 4.9 10*3/uL (ref 3.8–10.6)

## 2016-01-09 NOTE — Care Management Important Message (Signed)
Important Message  Patient Details  Name: Douglas MARCHI Sr. MRN: [id number] Date of Birth: 1927/09/24   Medicare Important Message Given:  Yes    Carles Collet, RN 01/09/2016, 9:36 AM

## 2016-01-09 NOTE — Telephone Encounter (Signed)
Late entry. I called the patient on 01/07/2016. I am able to follow along through the electronic records. He is not back to his normal baseline, but is slowly improving. He is grateful for help from the inpatient team. I will await update and discharge summary from the inpatient team. I appreciate help all involved. He thanked me for the call.

## 2016-01-09 NOTE — Progress Notes (Signed)
[place] at [place] NAME: Douglas Parker    MR#:  [id number]  DATE OF BIRTH:  1927-12-07  SUBJECTIVE; patient says that he feels better. Had a good night. Oxygen is down to 2 L. Having some cough with phlegm. Patient's wife, son and upset about their niece getting the to know that  information  was given information about the pt  by one of the ER nurses and they wanted to place the patient as xxx.   CHIEF COMPLAINT:   Chief Complaint  Patient presents with  . Shortness of Breath  . Cough    REVIEW OF SYSTEMS:   Review of Systems  Constitutional: Negative for chills and fever.  HENT: Negative for hearing loss.   Eyes: Negative for blurred vision, double vision and photophobia.  Respiratory: Positive for cough and shortness of breath. Negative for hemoptysis.   Cardiovascular: Negative for palpitations, orthopnea and leg swelling.  Gastrointestinal: Negative for abdominal pain, diarrhea and vomiting.  Genitourinary: Negative for dysuria and urgency.  Musculoskeletal: Negative for myalgias and neck pain.  Skin: Negative for rash.  Neurological: Negative for dizziness, focal weakness, seizures, weakness and headaches.  Psychiatric/Behavioral: Negative for memory loss. The patient does not have insomnia.     DRUG ALLERGIES:  No Known Allergies  VITALS:  Blood pressure (!) 144/64, pulse 71, temperature 98.7 F (37.1 C), temperature source Oral, resp. rate 14, height 5\' 8"  (1.727 m), weight 64.1 kg (141 lb 4.8 oz), SpO2 96 %.  PHYSICAL EXAMINATION:  GENERAL:  82 y.o.-year-old patient lying in the bed with no acute distress.  EYES: Pupils equal, round, reactive to light and accommodation. No scleral icterus. Extraocular muscles intact.  HEENT: Head atraumatic, normocephalic. Oropharynx and nasopharynx clear.  NECK:  Supple, no jugular venous distention. No thyroid enlargement, no tenderness.  LUNGS: decreased breath sounds  bilaterally,no wheezing.Marland Kitchen  CARDIOVASCULAR: S1, S2 normal. No murmurs, rubs, or gallops.  ABDOMEN: Soft, nontender, nondistended. Bowel sounds present. No organomegaly or mass.  EXTREMITIES: No pedal edema, cyanosis, or clubbing.  NEUROLOGIC: Cranial nerves II through XII are intact. Muscle strength 5/5 in all extremities. Sensation intact. Gait not checked.  PSYCHIATRIC: The patient is alert and oriented x 3.  SKIN: No obvious rash, lesion, or ulcer.    LABORATORY PANEL:   CBC  Recent Labs Lab 01/09/16 0516  WBC 4.9  HGB 11.1*  HCT 31.8*  PLT 167   ------------------------------------------------------------------------------------------------------------------  Chemistries   Recent Labs Lab 01/05/16 1555  NA 142  K 4.1  CL 107  CO2 29  GLUCOSE 123*  BUN 41*  CREATININE 1.03  CALCIUM 8.6*  AST 28  ALT 41  ALKPHOS 57  BILITOT 1.7*   ------------------------------------------------------------------------------------------------------------------  Cardiac Enzymes  Recent Labs Lab 01/05/16 2335  TROPONINI 0.06*   ------------------------------------------------------------------------------------------------------------------  RADIOLOGY:  Dg Chest 2 View  Result Date: 01/07/2016 CLINICAL DATA:  Hypoxia and shortness of breath. EXAM: CHEST  2 VIEW COMPARISON:  01/05/2016 CT and chest radiograph.  Prior studies FINDINGS: Cardiomegaly, aortic valve replacement and bilateral pleural effusions again noted. Slightly decreased pulmonary edema noted. Slightly increased bibasilar atelectasis noted. There is no evidence of pneumothorax. IMPRESSION: Slightly increased bibasilar atelectasis. Slightly improved interstitial edema. Unchanged cardiomegaly and bilateral pleural effusions. Electronically Signed   By: Margarette Canada M.D.   On: 01/07/2016 13:03   Ct Chest W Contrast  Result Date: 01/08/2016 CLINICAL DATA:  Continued shortness of breath and coughing. EXAM: CT CHEST  WITH CONTRAST  TECHNIQUE: Multidetector CT imaging of the chest was performed during intravenous contrast administration. CONTRAST:  93mL ISOVUE-300 IOPAMIDOL (ISOVUE-300) INJECTION 61% COMPARISON:  Chest CT 01/05/2016 FINDINGS: Cardiovascular: Prior transcatheter aortic valve replacement. Normal caliber of the thoracic aorta without dissection. The great vessels are patent. Main and central pulmonary arteries are patent without a large pulmonary embolism. Aortic atherosclerotic calcifications. Coronary artery calcifications. Mediastinum/Nodes: There is no significant chest lymphadenopathy. No significant pericardial fluid. Lungs/Pleura: Bilateral pleural effusions. Left pleural effusion is small for size and the right pleural effusion is moderate in size. Right pleural effusion has slightly enlarged compared to the recent comparison examination. Again noted are diffuse pleural plaques with calcifications. Pleural calcifications are suggestive for prior asbestos exposure. The trachea and mainstem bronchi are patent. There is increased compressive atelectasis in the right lower lobe compared to the recent comparison examination. Again noted are areas of airspace disease in the upper lobes. Airspace disease in the right upper lobe is more confluent and has progressed. Airspace disease in the left upper lobe has minimally changed. There is increased volume loss in the left lower lobe compared to the recent comparison exam. Upper Abdomen: Again noted is a large cyst in the left hepatic lobe, measuring up to 6.9 cm. Evidence for additional hypodense cysts in the liver. Main portal venous system is patent. No acute abnormality in the upper abdomen. Abdominal aorta is heavily calcified. Musculoskeletal: Again noted is severe dextroscoliosis in the lower thoracic spine with degenerative changes in the upper lumbar spine. IMPRESSION: Bilateral pleural effusions with enlargement of the mild right pleural effusion. Increased  airspace disease in the right upper lung is most likely related to pneumonia. Airspace disease in left upper lung has not significantly changed. There is increased volume loss in both lower lobes. Again noted are diffuse pleural plaques with calcifications. Findings compatible with prior asbestos exposure. Electronically Signed   By: Markus Daft M.D.   On: 01/08/2016 14:26    EKG:   Orders placed or performed during the hospital encounter of 01/05/16  . ED EKG  . ED EKG    ASSESSMENT AND PLAN:   #1 community-acquired pneumonia on the  right lung: Bilateral pleural effusions more on left side by echocardiogram. IV antibiotics, oxygen, steroids, nebulizers, incentive spirometry, appreciate pulmonary following the patient  #2 acute respiratory failure secondary  bilateral pleural effusion,  More  on the left side:  for possible thoracocentesis to help with the shortness of breath. Continue oxygen.   #3. History of BPH continue Finasteride   History of chronic diastolic heart failure: echo cardiogram done in April of this year showed EF 55-60%. No signs of fluid overload. Echocardiogram showed severe pulmonary hypertension with right-sided elevated pressures. , history of asbestosis exposure. Patient may need to go home with oxygen. Pulmonary artery pressure 54.   4. hyperlipidemia ;continue statins  Neuropathy continue Neurontin  Deconditioning: Physical therapy onsulted and recommended home health physical therapy  D/w nurse Change pt to XX to protect PHI.  All the records are reviewed and case discussed with Care Management/Social Workerr. Management plans discussed with the patient, family and they are in agreement.  CODE STATUS: Full code  TOTAL TIME TAKING CARE OF THIS PATIENT: 37minutes.   POSSIBLE D/C IN 1-2 DAYS, DEPENDING ON CLINICAL CONDITION.   Epifanio Lesches M.D on 01/09/2016 at 7:38 AM  Between 7am to 6pm - Pager - [phone]  After 6pm go to [email or web address]  - [facility]  Office  [phone]  CC: Primary care physician; Elsie Stain, MD   Note: This dictation was prepared with Dragon dictation along with smaller phrase technology. Any transcriptional errors that result from this process are unintentional.

## 2016-01-10 ENCOUNTER — Inpatient Hospital Stay: Payer: Medicare Other

## 2016-01-10 ENCOUNTER — Encounter: Payer: Medicare Other | Admitting: Family Medicine

## 2016-01-10 LAB — PROTEIN, BODY FLUID: Total protein, fluid: <3 g/dL

## 2016-01-10 LAB — CULTURE, BLOOD (ROUTINE X 2)
Culture: NO GROWTH
Culture: NO GROWTH

## 2016-01-10 LAB — BODY FLUID CELL COUNT WITH DIFFERENTIAL
EOS FL: 0 %
LYMPHS FL: 70 %
MONOCYTE-MACROPHAGE-SEROUS FLUID: 3 %
NEUTROPHIL FLUID: 27 %
Other Cells, Fluid: 0 %
WBC FLUID: 9 uL

## 2016-01-10 LAB — PROTIME-INR
INR: 1.08
PROTHROMBIN TIME: 14 s (ref 11.4–15.2)

## 2016-01-10 LAB — GLUCOSE, SEROUS FLUID: GLUCOSE FL: 121 mg/dL

## 2016-01-10 LAB — APTT: aPTT: 33 seconds (ref 24–36)

## 2016-01-10 LAB — LACTATE DEHYDROGENASE, PLEURAL OR PERITONEAL FLUID: LD FL: 92 U/L — AB (ref 3–23)

## 2016-01-10 NOTE — Progress Notes (Signed)
[facility] [place] NAME: Douglas Parker    MR#:  [id number]  DATE OF BIRTH:  1927-04-05  SUBJECTIVE:  CHIEF COMPLAINT:   Chief Complaint  Patient presents with  . Shortness of Breath  . Cough   - admitted with pneumonia, has pleural effusions, possible tap today of right pleural effusion - still needing 2L o2 - breathing is improving  REVIEW OF SYSTEMS:  Review of Systems  Constitutional: Negative for chills, fever and malaise/fatigue.  HENT: Negative for ear discharge, ear pain and nosebleeds.   Eyes: Negative for blurred vision and double vision.  Respiratory: Positive for cough, sputum production and shortness of breath. Negative for wheezing.   Cardiovascular: Negative for chest pain, palpitations and leg swelling.  Gastrointestinal: Negative for abdominal pain, constipation, diarrhea, nausea and vomiting.  Genitourinary: Negative for dysuria and urgency.  Musculoskeletal: Negative for back pain, joint pain and myalgias.  Neurological: Positive for weakness. Negative for dizziness, tingling, speech change, focal weakness, seizures and headaches.  Psychiatric/Behavioral: Negative for depression.    DRUG ALLERGIES:  No Known Allergies  VITALS:  Blood pressure (!) 162/75, pulse 72, temperature 97.8 F (36.6 C), temperature source Oral, resp. rate 18, height 5\' 8"  (1.727 m), weight 64.1 kg (141 lb 4.8 oz), SpO2 99 %.  PHYSICAL EXAMINATION:  Physical Exam  GENERAL:  82 y.o.-year-old patient lying in the bed with no acute distress. dishelved appearing. EYES: Pupils equal, round, reactive to light and accommodation. No scleral icterus. Extraocular muscles intact.  HEENT: Head atraumatic, normocephalic. Oropharynx and nasopharynx clear.  NECK:  Supple, no jugular venous distention. No thyroid enlargement, no tenderness.  LUNGS: Normal breath sounds bilaterally, no rales,rhonchi or crepitation. No use of accessory muscles of  respiration. Scattered wheezing posteriorly, decreased/scant breath sounds at bases. CARDIOVASCULAR: S1, S2 normal. No murmurs, rubs, or gallops.  ABDOMEN: Soft, nontender, nondistended. Bowel sounds present. No organomegaly or mass.  EXTREMITIES: No pedal edema, cyanosis, or clubbing.  NEUROLOGIC: Cranial nerves II through XII are intact. Muscle strength 5/5 in all extremities. Sensation intact. Gait not checked.  PSYCHIATRIC: The patient is alert and oriented x 3.  SKIN: No obvious rash, lesion, or ulcer.    LABORATORY PANEL:   CBC  Recent Labs Lab 01/09/16 0516  WBC 4.9  HGB 11.1*  HCT 31.8*  PLT 167   ------------------------------------------------------------------------------------------------------------------  Chemistries   Recent Labs Lab 01/05/16 1555  NA 142  K 4.1  CL 107  CO2 29  GLUCOSE 123*  BUN 41*  CREATININE 1.03  CALCIUM 8.6*  AST 28  ALT 41  ALKPHOS 57  BILITOT 1.7*   ------------------------------------------------------------------------------------------------------------------  Cardiac Enzymes  Recent Labs Lab 01/05/16 2335  TROPONINI 0.06*   ------------------------------------------------------------------------------------------------------------------  RADIOLOGY:  Ct Chest W Contrast  Result Date: 01/08/2016 CLINICAL DATA:  Continued shortness of breath and coughing. EXAM: CT CHEST WITH CONTRAST TECHNIQUE: Multidetector CT imaging of the chest was performed during intravenous contrast administration. CONTRAST:  87mL ISOVUE-300 IOPAMIDOL (ISOVUE-300) INJECTION 61% COMPARISON:  Chest CT 01/05/2016 FINDINGS: Cardiovascular: Prior transcatheter aortic valve replacement. Normal caliber of the thoracic aorta without dissection. The great vessels are patent. Main and central pulmonary arteries are patent without a large pulmonary embolism. Aortic atherosclerotic calcifications. Coronary artery calcifications. Mediastinum/Nodes: There is no  significant chest lymphadenopathy. No significant pericardial fluid. Lungs/Pleura: Bilateral pleural effusions. Left pleural effusion is small for size and the right pleural effusion is moderate in size. Right pleural effusion has slightly enlarged compared to  the recent comparison examination. Again noted are diffuse pleural plaques with calcifications. Pleural calcifications are suggestive for prior asbestos exposure. The trachea and mainstem bronchi are patent. There is increased compressive atelectasis in the right lower lobe compared to the recent comparison examination. Again noted are areas of airspace disease in the upper lobes. Airspace disease in the right upper lobe is more confluent and has progressed. Airspace disease in the left upper lobe has minimally changed. There is increased volume loss in the left lower lobe compared to the recent comparison exam. Upper Abdomen: Again noted is a large cyst in the left hepatic lobe, measuring up to 6.9 cm. Evidence for additional hypodense cysts in the liver. Main portal venous system is patent. No acute abnormality in the upper abdomen. Abdominal aorta is heavily calcified. Musculoskeletal: Again noted is severe dextroscoliosis in the lower thoracic spine with degenerative changes in the upper lumbar spine. IMPRESSION: Bilateral pleural effusions with enlargement of the mild right pleural effusion. Increased airspace disease in the right upper lung is most likely related to pneumonia. Airspace disease in left upper lung has not significantly changed. There is increased volume loss in both lower lobes. Again noted are diffuse pleural plaques with calcifications. Findings compatible with prior asbestos exposure. Electronically Signed   By: Markus Daft M.D.   On: 01/08/2016 14:26    EKG:   Orders placed or performed during the hospital encounter of 01/05/16  . ED EKG  . ED EKG    ASSESSMENT AND PLAN:   82 year old male with past medical history  significant for hypertension, chronic diastolic CHF, aortic valve stenosis, BPH presents to hospital secondary to worsening shortness of breath.  #1 acute hypoxic respiratory failure-secondary to COPD exacerbation and right upper lobe pneumonia. -Remains on steroids-can be changed to oral prednisone later today. -Continue nebulizer and inhalers -On Rocephin and azithromycin. Will treat for 7 days -Blood cultures are negative. -CT of the chest also with some asbestosisis  #2 right pleural effusion-increasing in size. Secondary to diastolic CHF. -Echo with EF of 60%. -Ordered for thoracentesis today. Lasix as needed -Remains on 2 L oxygen. Wean as tolerated  #3 hyperlipidemia-continue statin  #4 neuropathy-continue gabapentin  #5 generalized deconditioning-physical therapy consulted  #6 DVT prophylaxis-on Lovenox   Discussed with son over the phone.    All the records are reviewed and case discussed with Care Management/Social Workerr. Management plans discussed with the patient, family and they are in agreement.  CODE STATUS: Full Code  TOTAL TIME TAKING CARE OF THIS PATIENT: 38 minutes.   POSSIBLE D/C IN 1-2 DAYS, DEPENDING ON CLINICAL CONDITION.   Gladstone Lighter M.D on 01/10/2016 at 9:05 AM  Between 7am to 6pm - Pager - [phone]  After 6pm go to [email or web address] - password [email or web address] [facility]  Office  [phone]  CC: Primary care physician; Elsie Stain, MD

## 2016-01-10 NOTE — Procedures (Signed)
Successful US guided right sided thoracentesis yielding 1.1 L of serous pleural fluid.   Samples sent to lab for analysis. EBL: None No immediate complications.  Ronny Bacon, MD Pager #: [phone]

## 2016-01-10 NOTE — Progress Notes (Signed)
Physical Therapy Treatment Patient Details Name: Douglas PADGET Sr. MRN: [id number] DOB: [birth year] Today's Date: 01/10/2016    History of Present Illness Pt admitted for pneumonia. Pt with complaints of cough and SOB symptoms. Pt with history of HNT, aortic valve stenosis, and prior asbestos exposure.    PT Comments    Pt is making good progress towards goals with improved ambulation distance this date. Still requires 2L of O2 for all mobility, unable to read sats on pulse ox due to poor circulation. No SOB symptoms noted. Pt continues to be motivated to perform therapy. Improved balance with RW.  Follow Up Recommendations  Home health PT     Equipment Recommendations  None recommended by PT    Recommendations for Other Services       Precautions / Restrictions Precautions Precautions: Fall Restrictions Weight Bearing Restrictions: No    Mobility  Bed Mobility Overal bed mobility: Modified Independent             General bed mobility comments: safe technique performed with use of bed rails. Once seated at EOB, able to sit with supervision  Transfers Overall transfer level: Needs assistance Equipment used: Rolling walker (2 wheeled) Transfers: Sit to/from Stand Sit to Stand: Supervision         General transfer comment: Safe technique with multiple attempts this date progressing from needing cga to supervision for transfer. Safe technique with RW used  Ambulation/Gait Ambulation/Gait assistance: Min guard Ambulation Distance (Feet): 150 Feet Assistive device: Rolling walker (2 wheeled) Gait Pattern/deviations: Step-through pattern     General Gait Details: ambulated using reciprocal gait pattern with symmetrical stepping. IMproved endurance this date with no SOB symptoms noted. Safe technique during turns keeping body close to RW to improve balance.   Stairs            Wheelchair Mobility    Modified Rankin (Stroke Patients Only)        Balance                                    Cognition Arousal/Alertness: Awake/alert Behavior During Therapy: WFL for tasks assessed/performed Overall Cognitive Status: Within Functional Limits for tasks assessed                      Exercises Other Exercises Other Exercises: Pt ambulated to bathroom, needing supervision for standing without AD. Pt then able to ambulate to sink for handwashing with cga, reaching outside BOS for paper towels and drying hands. Safe technique performed with no LOB    General Comments        Pertinent Vitals/Pain Pain Assessment: No/denies pain    Home Living                      Prior Function            PT Goals (current goals can now be found in the care plan section) Acute Rehab PT Goals Patient Stated Goal: to get stronger PT Goal Formulation: With patient Time For Goal Achievement: 01/20/16 Potential to Achieve Goals: Good Progress towards PT goals: Progressing toward goals    Frequency    Min 2X/week      PT Plan Current plan remains appropriate    Co-evaluation             End of Session Equipment Utilized During Treatment: Gait belt;Oxygen Activity Tolerance: Patient tolerated  treatment well Patient left:  (sitting on side of bed with RN to give meds)     Time: [id number] PT Time Calculation (min) (ACUTE ONLY): 27 min  Charges:  $Gait Training: 8-22 mins $Therapeutic Activity: 8-22 mins                    G Codes:      Macy Polio 01-17-16, 10:54 AM  Greggory Stallion, PT, DPT [phone]

## 2016-01-10 NOTE — Progress Notes (Signed)
Patient went to thoracentesis today, consent was signed. Wife called with concerns and was updated. Wife clarified that the patient is no longer a smoker and has not smoked in in 30 years Wife was greatfull for update. Wife called again later on during shift with more concerns stating that she prefers her husband go to a rehab facility rather than having home health.

## 2016-01-11 ENCOUNTER — Inpatient Hospital Stay: Payer: Medicare Other

## 2016-01-11 LAB — MISC LABCORP TEST (SEND OUT): LABCORP TEST CODE: 19588

## 2016-01-11 LAB — BASIC METABOLIC PANEL
Anion gap: 4 — ABNORMAL LOW (ref 5–15)
BUN: 31 mg/dL — AB (ref 6–20)
CO2: 32 mmol/L (ref 22–32)
CREATININE: 0.9 mg/dL (ref 0.61–1.24)
Calcium: 8.3 mg/dL — ABNORMAL LOW (ref 8.9–10.3)
Chloride: 104 mmol/L (ref 101–111)
GFR calc Af Amer: >60 mL/min (ref >60)
GLUCOSE: 132 mg/dL — AB (ref 65–99)
Potassium: 4.4 mmol/L (ref 3.5–5.1)
SODIUM: 140 mmol/L (ref 135–145)

## 2016-01-11 LAB — CBC
HCT: 32.9 % — ABNORMAL LOW (ref 40.0–52.0)
Hemoglobin: 11.4 g/dL — ABNORMAL LOW (ref 13.0–18.0)
MCH: 31.1 pg (ref 26.0–34.0)
MCHC: 34.6 g/dL (ref 32.0–36.0)
MCV: 90 fL (ref 80.0–100.0)
PLATELETS: 203 10*3/uL (ref 150–440)
RBC: 3.66 MIL/uL — ABNORMAL LOW (ref 4.40–5.90)
RDW: 13 % (ref 11.5–14.5)
WBC: 6 10*3/uL (ref 3.8–10.6)

## 2016-01-11 LAB — PATHOLOGIST SMEAR REVIEW

## 2016-01-11 MED ORDER — AMLODIPINE BESYLATE 5 MG PO TABS
5.0000 mg | ORAL_TABLET | Freq: Every day | ORAL | Status: DC
  Administered 2016-01-11 – 2016-01-12 (×2): 5 mg via ORAL
  Filled 2016-01-11 (×2): qty 1

## 2016-01-11 MED ORDER — FUROSEMIDE 20 MG PO TABS
20.0000 mg | ORAL_TABLET | Freq: Every day | ORAL | Status: DC
  Administered 2016-01-11 – 2016-01-12 (×2): 20 mg via ORAL
  Filled 2016-01-11 (×2): qty 1

## 2016-01-11 MED ORDER — PREDNISONE 50 MG PO TABS
50.0000 mg | ORAL_TABLET | Freq: Every day | ORAL | Status: DC
  Administered 2016-01-11 – 2016-01-12 (×2): 50 mg via ORAL
  Filled 2016-01-11 (×2): qty 1

## 2016-01-11 MED ORDER — HYDRALAZINE HCL 20 MG/ML IJ SOLN
10.0000 mg | Freq: Four times a day (QID) | INTRAMUSCULAR | Status: DC | PRN
  Administered 2016-01-11: 10 mg via INTRAVENOUS
  Filled 2016-01-11: qty 1

## 2016-01-11 NOTE — Progress Notes (Signed)
Physical Therapy Treatment Patient Details Name: Douglas HARER Sr. MRN: [id number] DOB: 03-17-1928 Today's Date: 01/11/2016    History of Present Illness Pt admitted for pneumonia. Pt with complaints of cough and SOB symptoms. Pt with history of HNT, aortic valve stenosis, and prior asbestos exposure.    PT Comments    Pt is making good progress towards goals with improved gait training this date. Pt able to ambulate without O2 with safe technique. Slight SOB symptoms that resolve with short seated rest break. Unable to attain accurate pulse ox reading on ear lobe or finger. Safe technique using RW. Pt is close to baseline level. Good endurance with there-ex able to perform without rest breaks required.  Follow Up Recommendations  Home health PT     Equipment Recommendations  None recommended by PT    Recommendations for Other Services       Precautions / Restrictions Precautions Precautions: Fall Restrictions Weight Bearing Restrictions: No    Mobility  Bed Mobility Overal bed mobility: Independent             General bed mobility comments: safe technique performed  Transfers Overall transfer level: Modified independent Equipment used: Rolling walker (2 wheeled) Transfers: Sit to/from Stand Sit to Stand: Supervision         General transfer comment: safe technique performed with RW and able to push from seated surface  Ambulation/Gait Ambulation/Gait assistance: Min guard Ambulation Distance (Feet): 190 Feet Assistive device: Rolling walker (2 wheeled) Gait Pattern/deviations: Step-through pattern     General Gait Details: ambulated with reciprocal gait pattern and slow gait speed. Pt fatigues with increased distance with slight SOB symptoms noted. Unable to get pulse ox reading on finger or ear lobe. Seated rest break on bed for 30 seconds to 1 min   Stairs            Wheelchair Mobility    Modified Rankin (Stroke Patients Only)        Balance                                    Cognition Arousal/Alertness: Awake/alert Behavior During Therapy: WFL for tasks assessed/performed Overall Cognitive Status: Within Functional Limits for tasks assessed                      Exercises Other Exercises Other Exercises: Supine ther-ex performed on B LE including SLRs, hip ab/add, and heel slides. ALl ther-ex performed x 12 reps with cga with cues for sequencing.    General Comments        Pertinent Vitals/Pain Pain Assessment: No/denies pain    Home Living                      Prior Function            PT Goals (current goals can now be found in the care plan section) Acute Rehab PT Goals Patient Stated Goal: to get stronger PT Goal Formulation: With patient Time For Goal Achievement: 01/20/16 Potential to Achieve Goals: Good Progress towards PT goals: Progressing toward goals    Frequency    Min 2X/week      PT Plan Current plan remains appropriate    Co-evaluation             End of Session Equipment Utilized During Treatment: Gait belt Activity Tolerance: Patient tolerated treatment well Patient left: in  bed;with bed alarm set     Time: 1037-1100 PT Time Calculation (min) (ACUTE ONLY): 23 min  Charges:  $Gait Training: 8-22 mins $Therapeutic Exercise: 8-22 mins                    G Codes:      Shondell Poulson 10-Feb-2016, 1:40 PM  Greggory Stallion, PT, DPT [phone]

## 2016-01-11 NOTE — Progress Notes (Signed)
Patients BP was 194/70 this Am. Called MD and new orders for hydralazine and Norvasc received. Patients BP was 128/52 on re-assessment after receiving the new medications. Removed patients oxygen and patient is sating at 95% on room air.

## 2016-01-11 NOTE — Progress Notes (Addendum)
[facility] [place] NAME: Douglas Parker    MR#:  [id number]  DATE OF BIRTH:  24-Feb-1928  SUBJECTIVE:  CHIEF COMPLAINT:   Chief Complaint  Patient presents with  . Shortness of Breath  . Cough   - Weaned off oxygen. Pleural effusion tap done and  almost 1 L fluid taken out yesterday. -Physically feels very weak and wants to go to rehabilitation  REVIEW OF SYSTEMS:  Review of Systems  Constitutional: Negative for chills, fever and malaise/fatigue.  HENT: Negative for ear discharge, ear pain and nosebleeds.   Eyes: Negative for blurred vision and double vision.  Respiratory: Positive for cough, sputum production and shortness of breath. Negative for wheezing.   Cardiovascular: Negative for chest pain, palpitations and leg swelling.  Gastrointestinal: Negative for abdominal pain, constipation, diarrhea, nausea and vomiting.  Genitourinary: Negative for dysuria and urgency.  Musculoskeletal: Negative for back pain, joint pain and myalgias.  Neurological: Positive for weakness. Negative for dizziness, tingling, speech change, focal weakness, seizures and headaches.  Psychiatric/Behavioral: Negative for depression.    DRUG ALLERGIES:  No Known Allergies  VITALS:  Blood pressure (!) 128/52, pulse 77, temperature 98 F (36.7 C), temperature source Oral, resp. rate 20, height 5\' 8"  (1.727 m), weight 64.1 kg (141 lb 4.8 oz), SpO2 94 %.  PHYSICAL EXAMINATION:  Physical Exam  GENERAL:  82 y.o.-year-old patient lying in the bed with no acute distress. dishelved appearing. EYES: Pupils equal, round, reactive to light and accommodation. No scleral icterus. Extraocular muscles intact.  HEENT: Head atraumatic, normocephalic. Oropharynx and nasopharynx clear.  NECK:  Supple, no jugular venous distention. No thyroid enlargement, no tenderness.  LUNGS: Normal breath sounds bilaterally, no rales,rhonchi or crepitation. No use of accessory  muscles of respiration. No wheezing today, decreased/scant breath sounds at bases. CARDIOVASCULAR: S1, S2 normal. No murmurs, rubs, or gallops.  ABDOMEN: Soft, nontender, nondistended. Bowel sounds present. No organomegaly or mass.  EXTREMITIES: No pedal edema, cyanosis, or clubbing.  NEUROLOGIC: Cranial nerves II through XII are intact. Muscle strength 5/5 in all extremities. Sensation intact. Gait not checked.  PSYCHIATRIC: The patient is alert and oriented x 3.  SKIN: No obvious rash, lesion, or ulcer.    LABORATORY PANEL:   CBC  Recent Labs Lab 01/11/16 0259  WBC 6.0  HGB 11.4*  HCT 32.9*  PLT 203   ------------------------------------------------------------------------------------------------------------------  Chemistries   Recent Labs Lab 01/05/16 1555 01/11/16 0259  NA 142 140  K 4.1 4.4  CL 107 104  CO2 29 32  GLUCOSE 123* 132*  BUN 41* 31*  CREATININE 1.03 0.90  CALCIUM 8.6* 8.3*  AST 28  --   ALT 41  --   ALKPHOS 57  --   BILITOT 1.7*  --    ------------------------------------------------------------------------------------------------------------------  Cardiac Enzymes  Recent Labs Lab 01/05/16 2335  TROPONINI 0.06*   ------------------------------------------------------------------------------------------------------------------  RADIOLOGY:  Dg Chest 1 View  Result Date: 01/10/2016 CLINICAL DATA:  Post right-sided thoracentesis EXAM: CHEST 1 VIEW COMPARISON:  Chest radiograph - 01/07/2016; chest CT-01/08/2016 FINDINGS: Grossly unchanged cardiac silhouette and mediastinal contours. Post aortic valve replacement. Interval reduction/resolution of right-sided pleural effusion post thoracentesis. No pneumothorax. Slight increase in small to moderate size left-sided effusion. Improved aeration the right lung base. Worsening left basilar heterogeneous/ consolidative opacities, likely atelectasis. Bilateral pleural calcifications, unchanged. No  pneumothorax. No definite evidence of edema. No acute osseus abnormalities. Degenerative change of the bilateral glenohumeral joints, left greater than right, incompletely evaluated.  IMPRESSION: 1. Interval reduction/resolution of right-sided effusion post thoracentesis. No pneumothorax. 2. Slight increase in small to moderate size left-sided effusion with worsening left basilar opacities, likely atelectasis. 3. Sequela of prior asbestos exposure. Electronically Signed   By: Sandi Mariscal M.D.   On: 01/10/2016 15:23   US Thoracentesis Asp Pleural Space W/img Guide  Result Date: 01/10/2016 INDICATION: Symptomatic right sided pleural effusion - please perform ultrasound-guided thoracentesis for diagnostic and therapeutic purposes EXAM: US THORACENTESIS ASP PLEURAL SPACE W/IMG GUIDE COMPARISON:  Chest radiograph - 01/07/2016; chest CT - 01/08/2016 MEDICATIONS: None. COMPLICATIONS: None immediate. TECHNIQUE: Informed written consent was obtained from the patient after a discussion of the risks, benefits and alternatives to treatment. A timeout was performed prior to the initiation of the procedure. Initial ultrasound scanning demonstrates a large anechoic right-sided pleural effusion. The lower chest was prepped and draped in the usual sterile fashion. 1% lidocaine was used for local anesthesia. An ultrasound image was saved for documentation purposes. An 8 Fr Safe-T-Centesis catheter was introduced. The thoracentesis was performed. The catheter was removed and a dressing was applied. The patient tolerated the procedure well without immediate post procedural complication. The patient was escorted to have an upright chest radiograph. FINDINGS: A total of approximately 1.1 liters of serous fluid was removed. Requested samples were sent to the laboratory. IMPRESSION: Successful ultrasound-guided right sided thoracentesis yielding 1.1 liters of pleural fluid. Electronically Signed   By: Sandi Mariscal M.D.   On: 01/10/2016  15:24    EKG:   Orders placed or performed during the hospital encounter of 01/05/16  . ED EKG  . ED EKG    ASSESSMENT AND PLAN:   82 year old male with past medical history significant for hypertension, chronic diastolic CHF, aortic valve stenosis, BPH presents to hospital secondary to worsening shortness of breath.  #1 acute hypoxic respiratory failure-secondary to COPD exacerbation and right upper lobe pneumonia. -on steroids-changed to oral prednisone now. -Continue nebulizer and inhalers -On Rocephin and azithromycin. Will treat for 7 days -Blood cultures are negative. -CT of the chest also with some asbestosisis - f/u CXR today  #2 right pleural effusion-increased in size. Secondary to diastolic CHF. -Echo with EF of 60%. -Ordered for thoracentesis today. Lasix added -weaned off o2 now, not on home o2  #3 hyperlipidemia-continue statin  #4 hypertension-blood pressure is elevated. Added Norvasc. Also on Lasix and IV hydralazine as needed  #5 generalized short-term rehabilitation at discharge  #6 DVT prophylaxis-on Lovenox   Physical therapy recommended home health, however patient feels weak and wants to go to rehabilitation    All the records are reviewed and case discussed with Care Management/Social Workerr. Management plans discussed with the patient, family and they are in agreement.  CODE STATUS: Full Code  TOTAL TIME TAKING CARE OF THIS PATIENT: 38 minutes.   POSSIBLE D/C TOMORROW, DEPENDING ON CLINICAL CONDITION.   Gladstone Lighter M.D on 01/11/2016 at 12:35 PM  Between 7am to 6pm - Pager - [phone]  After 6pm go to [email or web address] - password [email or web address] [facility]  Office  [phone]  CC: Primary care physician; Elsie Stain, MD

## 2016-01-11 NOTE — Clinical Social Work Note (Signed)
Clinical Social Work Assessment  Patient Details  Name: Douglas BOLANDER Sr. MRN: [id number] Date of Birth: 1927/10/02  Date of referral:  01/11/16               Reason for consult:  Discharge Planning, Facility Placement                Permission sought to share information with:  Chartered certified accountant granted to share information::  Yes, Verbal Permission Granted  Name::      [place]::   [place] [address]   Relationship::     Contact Information:     Housing/Transportation Living arrangements for the past 2 months:  [place] of Information:  Patient Patient Interpreter Needed:  None Criminal Activity/Legal Involvement Pertinent to Current Situation/Hospitalization:  No - Comment as needed Significant Relationships:  Adult Children, Spouse Lives with:  Spouse Do you feel safe going back to the place where you live?  Yes Need for family participation in patient care:  Yes (Comment)  Care giving concerns:  Patient lives in [place] with his spouse, [place].    Social Worker assessment / plan:  Clinical social work received verbal  consult from MD that patient is requesting to go to rehab. PT is recommending home health. Social work Theatre manager met with patient at bedside and described social work department roles. Patient was sitting up in bed and was alert and oriented. Per patient, he lives at home with his wife [place] in [place]. Patient has two sons, one is deceased.  PT is recommending home health but patient is wanting to go to [facility] for rehab. Social work Theatre manager described to patient that with his current insurance of [employer] and the progress he has made with PT , his insurance will only pay for a limited about of days to go to [facility]. Patient reported that "going for a few days would not benefit me much" but is still wanting to go. Patient said he is unsure where he would go if he went home. Patient  said he would talk to his son [place] or brother to see if he can stay with either one of them temporarily to receive home health. Patient reported that his son [place] will be by later to see patient. Patient does not have HPOA at this time. [facility] has extended a bed offer and patient has accepted bed offer. RN case manager aware of above.      Employment status:  Retired Nurse, adult PT Recommendations:  Home with [place] / Referral to community resources:  [place]  Patient/Family's Response to care:  Patient is wanting to go to [facility] for rehab although PT is recommending home health.   Patient/Family's Understanding of and Emotional Response to Diagnosis, Current Treatment, and Prognosis:  Patient was pleasant and thanked social work Theatre manager for coming by.   Emotional Assessment Appearance:  Appears stated age Attitude/Demeanor/Rapport:    Affect (typically observed):  Accepting, Adaptable, Appropriate Orientation:  Oriented to Self, Oriented to Place, Oriented to  Time, Oriented to Situation Alcohol / Substance use:  Not Applicable Psych involvement (Current and /or in the community):  No (Comment)  Discharge Needs  Concerns to be addressed:  Discharge Planning Concerns, Basic Needs Readmission within the last 30 days:  No Current discharge risk:  None Barriers to Discharge:  Continued Medical Work up   [employer] Work  01/11/2016, 11:11 AM

## 2016-01-11 NOTE — Clinical Social Work Placement (Signed)
   CLINICAL SOCIAL WORK PLACEMENT  NOTE  Date:  01/11/2016  Patient Details  Name: Douglas Mazziotti XXXGerringer Sr. MRN: [id number] Date of Birth: [birth year]  Clinical Social Work is seeking post-discharge placement for this patient at the [place] level of care (*CSW will initial, date and re-position this form in  chart as items are completed):  Yes   Patient/family provided with [place] Work Department's list of facilities offering this level of care within the geographic area requested by the patient (or if unable, by the patient's family).  Yes   Patient/family informed of their freedom to choose among providers that offer the needed level of care, that participate in Medicare, Medicaid or managed care program needed by the patient, have an available bed and are willing to accept the patient.  Yes   Patient/family informed of [facility]'s ownership interest in [facility] and [facility], as well as of the fact that they are under no obligation to receive care at these facilities.  PASRR submitted to EDS on       PASRR number received on       Existing PASRR number confirmed on 01/11/16     FL2 transmitted to all facilities in geographic area requested by pt/family on 01/11/16     FL2 transmitted to all facilities within larger geographic area on       Patient informed that his/her managed care company has contracts with or will negotiate with certain facilities, including the following:        Yes   Patient/family informed of bed offers received.  Patient chooses bed at  [facility])     Physician recommends and patient chooses bed at      Patient to be transferred to   on  .  Patient to be transferred to facility by       Patient family notified on   of transfer.  Name of family member notified:        PHYSICIAN       Additional Comment:    _______________________________________________ Danie Chandler,  [place] Work 01/11/2016, 11:47 AM

## 2016-01-11 NOTE — Progress Notes (Signed)
Clinical Education officer, museum (CSW) received a call back from patient's wife Fraser Din. CSW explained to wife that PT is recommending home health and that [facility] patient from the hospital however his insurance [employer] will likely stop paying for SNF in 1-2 days at [facility]. Wife verbalized her understanding and reported that she would speak with patient about what he wants to do. Wife asked if patient could stay at the hospital. CSW explained that once patient is medically stable, which will likely be tomorrow per MD patient will have to discharge home or to [facility]. CSW explained that a hospital is for acute needs and patients do not usually stay long term for rehabilitation. Wife verbalized her understanding. CSW will continue to follow and assist as needed.   [employer], LCSW [phone]

## 2016-01-11 NOTE — NC FL2 (Signed)
[place] LEVEL OF CARE SCREENING TOOL     IDENTIFICATION  Patient Name: [place]. Birthdate: [birth year] Sex: male Admission Date (Current Location): 01/05/2016  [place] and [place] Number:  Engineering geologist and Address:  [facility], [address], [place], [place] [address]      Provider Number: [id number]  Attending Physician Name and Address:  Gladstone Lighter, MD  Relative Name and Phone Number:       Current Level of Care: Hospital Recommended Level of Care: [place] Prior Approval Number:    Date Approved/Denied:   PASRR Number:  ([id number])  Discharge Plan: SNF    Current Diagnoses: Patient Active Problem List   Diagnosis Date Noted  . Hypoxia 01/05/2016  . Pneumonia 01/05/2016  . Atypical chest pain 12/01/2015  . Cervical lymphadenopathy 04/20/2015  . Nausea without vomiting 11/11/2014  . Fatigue 06/19/2014  . CCF (congestive cardiac failure) ([place]) 04/03/2014  . BPH (benign prostatic hyperplasia) 02/10/2014  . Dyslipidemia 02/10/2014  . GERD (gastroesophageal reflux disease) 02/10/2014  . Severe aortic valve stenosis 02/03/2014  . 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 Aortic stenosis, severe 12/19/2013  . Chronic diastolic congestive heart failure ([place])   . Back pain   . Edema 11/20/2013  . Loss of weight 06/26/2013  . Cornea scar 04/09/2013  . Medicare annual wellness visit, initial 03/06/2013  . Chronic glaucoma 12/17/2012  . Shortness of breath 10/31/2012  . Aortic valve stenosis 09/10/2012  . Pseudoaphakia 06/28/2012  . Living will, counseling/discussion 12/28/2011  . 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 Degeneration macular 03/06/2011  . Cataract, nuclear sclerotic senile 03/06/2011  . Constipation 01/02/2007  . Essential hypertension 05/19/2003  . CA IN SITU, SKIN NOS 05/19/1999  . Arthropathy of pelvic region and thigh 05/18/1997  .  HEMORRHOIDS, INTERNAL, WITH BLEEDING 03/20/1994  . DIVERTICULOSIS, COLON 02/17/1994  . FX OPEN MLT PELVIS W/PELVIC CIRCULAT DISRUPT 03/20/1986  . SCOLIOSIS, THORACIC SPINE 03/20/1944    Orientation RESPIRATION BLADDER Height & Weight     Self, Time, Situation, Place  O2 (Nasal Cannula 2L/min) Continent Weight: 141 lb 4.8 oz (64.1 kg) Height:  5\' 8"  (172.7 cm)  BEHAVIORAL SYMPTOMS/MOOD NEUROLOGICAL BOWEL NUTRITION STATUS   (None.)  (None.) Continent Diet (Diet: Heart)  AMBULATORY STATUS COMMUNICATION OF NEEDS Skin   Extensive Assist Verbally Normal                       Personal Care Assistance Level of Assistance  Bathing, Feeding, Dressing Bathing Assistance: Limited assistance Feeding assistance: Independent Dressing Assistance: Limited assistance     Functional Limitations Info  Sight, Hearing, Speech Sight Info: Adequate Hearing Info: Adequate Speech Info: Adequate    SPECIAL CARE FACTORS FREQUENCY  PT (By licensed PT), OT (By licensed OT)     PT Frequency:  (5) OT Frequency:  (5)            Contractures      Additional Factors Info  Code Status, Allergies Code Status Info:  (Full Code) Allergies Info:  (No Known Allergies)           Current Medications (01/11/2016):  This is the current hospital active medication list Current Facility-Administered Medications  Medication Dose Route Frequency Provider Last Rate Last Dose  . acetaminophen (TYLENOL) tablet 650 mg  650 mg Oral Q6H PRN Epifanio Lesches, MD      . amLODipine (NORVASC) tablet 5 mg  5 mg  Oral Daily Gladstone Lighter, MD   5 mg at 01/11/16 0840  . atorvastatin (LIPITOR) tablet 20 mg  20 mg Oral QHS Demetrios Loll, MD   20 mg at 01/10/16 2118  . azithromycin (ZITHROMAX) tablet 500 mg  500 mg Oral Q24H Epifanio Lesches, MD   500 mg at 01/10/16 1657  . cefTRIAXone (ROCEPHIN) IVPB 1 g  1 g Intravenous Q24H Demetrios Loll, MD   1 g at 01/10/16 1657  . enoxaparin (LOVENOX) injection 40 mg  40 mg  Subcutaneous Q24H Demetrios Loll, MD   40 mg at 01/10/16 2117  . finasteride (PROSCAR) tablet 5 mg  5 mg Oral Daily Demetrios Loll, MD   5 mg at 01/11/16 0840  . gabapentin (NEURONTIN) capsule 100 mg  100 mg Oral TID Demetrios Loll, MD   100 mg at 01/11/16 0840  . guaiFENesin-dextromethorphan (ROBITUSSIN DM) 100-10 MG/5ML syrup 5 mL  5 mL Oral Q4H PRN Demetrios Loll, MD      . hydrALAZINE (APRESOLINE) injection 10 mg  10 mg Intravenous Q6H PRN Gladstone Lighter, MD   10 mg at 01/11/16 0841  . ipratropium-albuterol (DUONEB) 0.5-2.5 (3) MG/3ML nebulizer solution 3 mL  3 mL Nebulization TID Demetrios Loll, MD   3 mL at 01/11/16 0800  . latanoprost (XALATAN) 0.005 % ophthalmic solution 1 drop  1 drop Both Eyes QHS Demetrios Loll, MD   1 drop at 01/10/16 2118  . methylPREDNISolone sodium succinate (SOLU-MEDROL) 125 mg/2 mL injection 60 mg  60 mg Intravenous Q24H Epifanio Lesches, MD   60 mg at 01/10/16 1521  . oxyCODONE (Oxy IR/ROXICODONE) immediate release tablet 10 mg  10 mg Oral TID PRN Epifanio Lesches, MD         Discharge Medications: Please see discharge summary for a list of discharge medications.  Relevant Imaging Results:  Relevant Lab Results:   Additional Information  (SSN: [id number])  Danie Chandler, Student-Social Work

## 2016-01-12 ENCOUNTER — Telehealth: Payer: Self-pay

## 2016-01-12 LAB — BASIC METABOLIC PANEL
Anion gap: 7 (ref 5–15)
BUN: 31 mg/dL — ABNORMAL HIGH (ref 6–20)
CO2: 29 mmol/L (ref 22–32)
Calcium: 8.3 mg/dL — ABNORMAL LOW (ref 8.9–10.3)
Chloride: 104 mmol/L (ref 101–111)
Creatinine, Ser: 0.9 mg/dL (ref 0.61–1.24)
GFR calc Af Amer: >60 mL/min (ref >60)
GFR calc non Af Amer: >60 mL/min (ref >60)
Glucose, Bld: 114 mg/dL — ABNORMAL HIGH (ref 65–99)
Potassium: 4.1 mmol/L (ref 3.5–5.1)
Sodium: 140 mmol/L (ref 135–145)

## 2016-01-12 MED ORDER — AMLODIPINE BESYLATE 5 MG PO TABS: 5.0000 mg | ORAL_TABLET | Freq: Every day | ORAL | 2 refills | Status: DC

## 2016-01-12 MED ORDER — PREDNISONE 10 MG (21) PO TBPK: 10.0000 mg | ORAL_TABLET | Freq: Every day | ORAL | 0 refills | Status: DC

## 2016-01-12 MED ORDER — OXYCODONE HCL 10 MG PO TABS: 10.0000 mg | ORAL_TABLET | Freq: Three times a day (TID) | ORAL | 0 refills | Status: DC | PRN

## 2016-01-12 MED ORDER — FUROSEMIDE 20 MG PO TABS: 20.0000 mg | ORAL_TABLET | Freq: Every day | ORAL | 2 refills | Status: DC

## 2016-01-12 MED ORDER — AMLODIPINE BESYLATE 10 MG PO TABS: 10.0000 mg | ORAL_TABLET | Freq: Every day | ORAL | 0 refills | Status: DC

## 2016-01-12 MED ORDER — GUAIFENESIN-DM 100-10 MG/5ML PO SYRP: 5.0000 mL | ORAL_SOLUTION | ORAL | 0 refills | Status: DC | PRN

## 2016-01-12 MED ORDER — IPRATROPIUM-ALBUTEROL 0.5-2.5 (3) MG/3ML IN SOLN
3.0000 mL | Freq: Three times a day (TID) | RESPIRATORY_TRACT | 0 refills | Status: DC
Start: 2016-01-12 — End: 2016-02-08

## 2016-01-12 NOTE — Care Management Important Message (Signed)
Important Message  Patient Details  Name: [place] Sr. MRN: [id number] Date of Birth: Apr 22, 1927   Medicare Important Message Given:  Yes    Jolly Mango, RN 01/12/2016, 10:48 AM

## 2016-01-12 NOTE — Telephone Encounter (Signed)
[name] Comins left v/m that rehab was taken care of at [facility] and pt is going to rehab; nothing further needed. FYI to Dr Damita Dunnings.

## 2016-01-12 NOTE — Discharge Instructions (Signed)
1. Follow up Chest X ray in 5 days for pleural effusions

## 2016-01-12 NOTE — Clinical Social Work Placement (Signed)
   CLINICAL SOCIAL WORK PLACEMENT  NOTE  Date:  01/12/2016  Patient Details  Name: Douglas Ennen XXXGerringer Sr. MRN: [id number] Date of Birth: [birth year]  Clinical Social Work is seeking post-discharge placement for this patient at the [place] level of care (*CSW will initial, date and re-position this form in  chart as items are completed):  Yes   Patient/family provided with [place] Work Department's list of facilities offering this level of care within the geographic area requested by the patient (or if unable, by the patient's family).  Yes   Patient/family informed of their freedom to choose among providers that offer the needed level of care, that participate in Medicare, Medicaid or managed care program needed by the patient, have an available bed and are willing to accept the patient.  Yes   Patient/family informed of [facility]'s ownership interest in [facility] and [facility], as well as of the fact that they are under no obligation to receive care at these facilities.  PASRR submitted to EDS on       PASRR number received on       Existing PASRR number confirmed on 01/11/16     FL2 transmitted to all facilities in geographic area requested by pt/family on 01/11/16     FL2 transmitted to all facilities within larger geographic area on       Patient informed that his/her managed care company has contracts with or will negotiate with certain facilities, including the following:        Yes   Patient/family informed of bed offers received.  Patient chooses bed at  [facility])     Physician recommends and patient chooses bed at      Patient to be transferred to  [facility] ) on 01/12/16.  Patient to be transferred to facility by  [facility] )     Patient family notified on 01/12/16 of transfer.  Name of family member notified:   (Patient's wife Fraser Din is aware of D/C today. )     PHYSICIAN        Additional Comment:    _______________________________________________ Leea Rambeau, Veronia Beets, LCSW 01/12/2016, 11:09 AM

## 2016-01-12 NOTE — Telephone Encounter (Signed)
[name] left v/m; pt is being discharged from hospital today and [name] wants pt to go to rehab facility. [name] has spoken with Education officer, museum at hospital and was told pt would not be eligible for more than 2 -3 days at rehab facility; [name] thinks since pt is so weak  That he needs at least 2 weeks in rehab before returning home. [name] request that Dr Damita Dunnings have pt admitted to rehab. [name] request cb.

## 2016-01-12 NOTE — Progress Notes (Signed)
Patient is being discharged to [facility] via EMS. Patient aware. Report called. No c/o pain and RA. Iv removed with cath intact. Waiting for EMS at this time.

## 2016-01-12 NOTE — Discharge Summary (Addendum)
[facility] NAME: Douglas Parker    MR#:  [id number]  DATE OF BIRTH:  07/26/1927  DATE OF ADMISSION:  01/05/2016   ADMITTING PHYSICIAN: Demetrios Loll, MD  DATE OF DISCHARGE: 01/12/16  PRIMARY CARE PHYSICIAN: Elsie Stain, MD   ADMISSION DIAGNOSIS:   Demand ischemia ([place]) [I24.8] Bilateral pulmonary infiltrates on chest x-ray [R91.8] Acute respiratory failure with hypoxemia ([place]) [J96.01] Community acquired pneumonia, unspecified laterality [J18.9]  DISCHARGE DIAGNOSIS:   Active Problems:   Pneumonia   SECONDARY DIAGNOSIS:   Past Medical History:  Diagnosis Date  . Aortic valve stenosis 09/10/2012  . Arthritis   . Back pain   . Benign prostatic hypertrophy 10/18/00  . Cancer ([place])    skin cancers  . CHF (congestive heart failure) ([place]) 04/21/03  . Chronic diastolic congestive heart failure ([place])   . Colon polyps 06/05/2002   path could not be found   . Diverticulosis of colon 06/05/2002  . Hyperlipidemia 09/05/95  . Hypertension 05/19/03  . 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 Scoliosis   . Thinning of skin    bruises easy.    HOSPITAL COURSE:   82 year old male with past medical history significant for hypertension, chronic diastolic CHF, aortic valve stenosis, BPH presents to hospital secondary to worsening shortness of breath.  #1 acute hypoxic respiratory failure-secondary to COPD exacerbation and right upper lobe pneumonia. -on steroids-changed to oral prednisone taper. -Continue nebulizer  -Finished 7 days of Rocephin and azithromycin today - Also ECHO with severe pulmonary hypertension- off o2 now - Follow up with Pulmonology as outpatient -Blood cultures are negative. -CT of the chest also with some asbestosisis  #2 right pleural effusion-increased in size. Secondary to diastolic  CHF. Also has severe pulmonary hypertension -Echo with EF of 60%. -s/p right sided thoracentesis ans almost 1.1L of serous fluid drained. Still has some left pleural effusion and trivial pericardial effusion. Lasix added - outpatient pulmonary follow up for pulm hypertension -off o2 now, not on home o2  #3 hyperlipidemia-continue statin  #4 hypertension- blood pressure is elevated. Added Norvasc. Increased the dose as well. Also on Lasix  #5 generalized weakness- short-term rehabilitation at discharge  Anticipate discharge to rehab today  DISCHARGE CONDITIONS:   Guarded  CONSULTS OBTAINED:   Treatment Team:  Gladstone Lighter, MD  DRUG ALLERGIES:   No Known Allergies DISCHARGE MEDICATIONS:     Medication List    TAKE these medications   amLODipine 5 MG tablet Commonly known as:  NORVASC Take 1 tablet (5 mg total) by mouth daily. Start taking on:  01/13/2016   aspirin 81 MG EC tablet Take 81 mg by mouth daily.   atorvastatin 20 MG tablet Commonly known as:  LIPITOR Take 1 tablet (20 mg total) by mouth at bedtime.   finasteride 5 MG tablet Commonly known as:  PROSCAR Take 1 tablet (5 mg total) by mouth daily.   furosemide 20 MG tablet Commonly known as:  LASIX Take 1 tablet (20 mg total) by mouth daily. Start taking on:  01/13/2016   gabapentin 100 MG capsule Commonly known as:  NEURONTIN Take 1 capsule (100 mg total) by mouth 3 (three) times daily. For burning pain   guaiFENesin-dextromethorphan 100-10 MG/5ML syrup Commonly known as:  ROBITUSSIN DM Take 5 mLs by mouth every 4 (four) hours as needed for cough.  ipratropium-albuterol 0.5-2.5 (3) MG/3ML Soln Commonly known as:  DUONEB Take 3 mLs by nebulization 3 (three) times daily.   Oxycodone HCl 10 MG Tabs Take 1 tablet (10 mg total) by mouth 3 (three) times daily as needed.   predniSONE 10 MG (21) Tbpk tablet Commonly known as:  STERAPRED UNI-PAK 21 TAB Take 1 tablet (10 mg total) by mouth  daily. 6 tabs PO x 1 day 5 tabs PO x 1 day 4 tabs PO x 1 day 3 tabs PO x 1 day 2 tabs PO x 1 day 1 tab PO x 1 day and stop   TRAVATAN Z 0.004 % Soln ophthalmic solution Generic drug:  Travoprost (BAK Free) Place 1 drop into the right eye at bedtime.        DISCHARGE INSTRUCTIONS:   1. PCP f/u in 1 week 2. Pulmonary follow up in 1 week 3. Follow up CXR in 5 days  DIET:   Cardiac diet  ACTIVITY:   Activity as tolerated  OXYGEN:   Home Oxygen: No.  Oxygen Delivery: room air  DISCHARGE LOCATION:   nursing home   If you experience worsening of your admission symptoms, develop shortness of breath, life threatening emergency, suicidal or homicidal thoughts you must seek medical attention immediately by calling 911 or calling your MD immediately  if symptoms less severe.  You Must read complete instructions/literature along with all the possible adverse reactions/side effects for all the Medicines you take and that have been prescribed to you. Take any new Medicines after you have completely understood and accpet all the possible adverse reactions/side effects.   Please note  You were cared for by a hospitalist during your hospital stay. If you have any questions about your discharge medications or the care you received while you were in the hospital after you are discharged, you can call the unit and asked to speak with the hospitalist on call if the hospitalist that took care of you is not available. Once you are discharged, your primary care physician will handle any further medical issues. Please note that NO REFILLS for any discharge medications will be authorized once you are discharged, as it is imperative that you return to your primary care physician (or establish a relationship with a primary care physician if you do not have one) for your aftercare needs so that they can reassess your need for medications and monitor your lab values.    On the day of Discharge:  VITAL  SIGNS:   Blood pressure (!) 165/62, pulse 75, temperature 98.5 F (36.9 C), temperature source Oral, resp. rate 18, height 5\' 8"  (1.727 m), weight 64.1 kg (141 lb 4.8 oz), SpO2 97 %.  PHYSICAL EXAMINATION:    GENERAL:  82 y.o.-year-old patient lying in the bed with no acute distress. dishelved appearing. EYES: Pupils equal, round, reactive to light and accommodation. No scleral icterus. Extraocular muscles intact.  HEENT: Head atraumatic, normocephalic. Oropharynx and nasopharynx clear.  NECK:  Supple, no jugular venous distention. No thyroid enlargement, no tenderness.  LUNGS: Normal breath sounds bilaterally, no rales,rhonchi or crepitation. No use of accessory muscles of respiration. No wheezing today, decreased/scant breath sounds at bases. CARDIOVASCULAR: S1, S2 normal. No murmurs, rubs, or gallops.  ABDOMEN: Soft, nontender, nondistended. Bowel sounds present. No organomegaly or mass.  EXTREMITIES: No pedal edema, cyanosis, or clubbing.  NEUROLOGIC: Cranial nerves II through XII are intact. Muscle strength 5/5 in all extremities. Sensation intact. Gait not checked.  PSYCHIATRIC: The patient is alert and  oriented x 3.  SKIN: No obvious rash, lesion, or ulcer.    DATA REVIEW:   CBC  Recent Labs Lab 01/11/16 0259  WBC 6.0  HGB 11.4*  HCT 32.9*  PLT 203    Chemistries   Recent Labs Lab 01/05/16 1555  01/12/16 0332  NA 142  < > 140  K 4.1  < > 4.1  CL 107  < > 104  CO2 29  < > 29  GLUCOSE 123*  < > 114*  BUN 41*  < > 31*  CREATININE 1.03  < > 0.90  CALCIUM 8.6*  < > 8.3*  AST 28  --   --   ALT 41  --   --   ALKPHOS 57  --   --   BILITOT 1.7*  --   --   < > = values in this interval not displayed.   Microbiology Results  Results for orders placed or performed during the hospital encounter of 01/05/16  Blood culture (routine x 2)     Status: None   Collection Time: 01/05/16  5:35 PM  Result Value Ref Range Status   Specimen Description BLOOD LEFT WRIST   Final   Special Requests BOTTLES DRAWN AEROBIC AND ANAEROBIC AER 5CC 5CC  Final   Culture NO GROWTH 5 DAYS  Final   Report Status 01/10/2016 FINAL  Final  Blood culture (routine x 2)     Status: None   Collection Time: 01/05/16  5:35 PM  Result Value Ref Range Status   Specimen Description BLOOD RIGHT AC  Final   Special Requests BOTTLES DRAWN AEROBIC AND ANAEROBIC AER4CC ANA 3CC  Final   Culture NO GROWTH 5 DAYS  Final   Report Status 01/10/2016 FINAL  Final  Culture, sputum-assessment     Status: None   Collection Time: 01/06/16  9:45 AM  Result Value Ref Range Status   Specimen Description EXPECTORATED SPUTUM  Final   Special Requests NONE  Final   Sputum evaluation   Final    Sputum specimen not acceptable for testing.  Please recollect.   SPOKE WITH MELANIE WOMBLE AT [id number] 01/06/16 SDR    Report Status 01/06/2016 FINAL  Final  Body fluid culture     Status: None (Preliminary result)   Collection Time: 01/10/16  2:28 PM  Result Value Ref Range Status   Specimen Description FLUID PLEURAL  Final   Special Requests NONE  Final   Gram Stain NO WBC SEEN NO ORGANISMS SEEN   Final   Culture   Final    NO GROWTH 2 DAYS Performed at [facility]    Report Status PENDING  Incomplete    RADIOLOGY:  Dg Chest 2 View  Result Date: 01/11/2016 CLINICAL DATA:  Pneumonia. EXAM: CHEST  2 VIEW COMPARISON:  01/10/2016 FINDINGS: Prior aortic valve repair. Cardiomegaly. Moderate left pleural effusion, slightly decreased since prior study with left lower lobe atelectasis or infiltrate. Small right pleural effusions suspected. Calcified pleural plaques noted bilaterally. IMPRESSION: Moderate left pleural effusion and small right pleural effusion. The left pleural effusion has decreased since prior study. Left lower lobe atelectasis or infiltrate. Cardiomegaly. Calcified pleural plaques. Electronically Signed   By: Rolm Baptise M.D.   On: 01/11/2016 15:09     Management plans discussed  with the patient, family and they are in agreement.  CODE STATUS:     Code Status Orders        Start     Ordered  01/05/16 2240  Full code  Continuous     01/05/16 2239    Code Status History    Date Active Date Inactive Code Status Order ID Comments User Context   02/03/2014  2:18 PM 02/06/2014  5:53 PM Full Code [id number]  Sherren Mocha, MD Inpatient      TOTAL TIME TAKING CARE OF THIS PATIENT: 38 minutes.    Gladstone Lighter M.D on 01/12/2016 at 10:36 AM  Between 7am to 6pm - Pager - [phone]  After 6pm go to [email or web address] - password [email or web address] [facility]  [facility]  Office  [phone]  CC: Primary care physician; Elsie Stain, MD   Note: This dictation was prepared with Dragon dictation along with smaller phrase technology. Any transcriptional errors that result from this process are unintentional.

## 2016-01-12 NOTE — Telephone Encounter (Signed)
Thanks

## 2016-01-12 NOTE — Telephone Encounter (Signed)
Unfortunately, I have no ability to get the patient admitted to rehab. It really depends on his current status and how much progress he makes in the near future.

## 2016-01-12 NOTE — Progress Notes (Signed)
Patient is medically stable for D/C to [facility] today. Per [facility] admissions coordinator at [facility] patient will have a private room 1205. RN will call report and arrange EMS for transport. Clinical Education officer, museum (CSW) sent D/C orders to [place] via [place]. Patient is aware of above. Patient's wife Fraser Din is aware of above. Please reconsult if future social work needs arise. CSW signing off.   [employer], LCSW [phone]

## 2016-01-13 ENCOUNTER — Non-Acute Institutional Stay (SKILLED_NURSING_FACILITY): Payer: Medicare Other | Admitting: Internal Medicine

## 2016-01-13 ENCOUNTER — Encounter: Payer: Self-pay | Admitting: Internal Medicine

## 2016-01-13 DIAGNOSIS — K5901 Slow transit constipation: Secondary | ICD-10-CM

## 2016-01-13 DIAGNOSIS — N400 Enlarged prostate without lower urinary tract symptoms: Secondary | ICD-10-CM

## 2016-01-13 DIAGNOSIS — J189 Pneumonia, unspecified organism: Secondary | ICD-10-CM

## 2016-01-13 DIAGNOSIS — J441 Chronic obstructive pulmonary disease with (acute) exacerbation: Secondary | ICD-10-CM | POA: Diagnosis not present

## 2016-01-13 DIAGNOSIS — M792 Neuralgia and neuritis, unspecified: Secondary | ICD-10-CM

## 2016-01-13 DIAGNOSIS — E440 Moderate protein-calorie malnutrition: Secondary | ICD-10-CM | POA: Diagnosis not present

## 2016-01-13 DIAGNOSIS — I5032 Chronic diastolic (congestive) heart failure: Secondary | ICD-10-CM

## 2016-01-13 DIAGNOSIS — D638 Anemia in other chronic diseases classified elsewhere: Secondary | ICD-10-CM | POA: Diagnosis not present

## 2016-01-13 DIAGNOSIS — J181 Lobar pneumonia, unspecified organism: Secondary | ICD-10-CM | POA: Diagnosis not present

## 2016-01-13 DIAGNOSIS — L89111 Pressure ulcer of right upper back, stage 1: Secondary | ICD-10-CM | POA: Diagnosis not present

## 2016-01-13 DIAGNOSIS — R5381 Other malaise: Secondary | ICD-10-CM

## 2016-01-13 DIAGNOSIS — G894 Chronic pain syndrome: Secondary | ICD-10-CM

## 2016-01-13 DIAGNOSIS — I10 Essential (primary) hypertension: Secondary | ICD-10-CM | POA: Diagnosis not present

## 2016-01-13 NOTE — Progress Notes (Signed)
LOCATION: [place]  PCP: Elsie Stain, MD   Code Status: Full Code  Goals of care: Advanced Directive information Advanced Directives 01/05/2016  Does patient have an advance directive? No  Type of Advance Directive -  Does patient want to make changes to advanced directive? -  Copy of advanced directive(s) in chart? -  Would patient like information on creating an advanced directive? No - patient declined information       Extended Emergency Contact Information Primary Emergency Contact: Tokar,Pat Address: [address] [place]          [place], [place] [address] [place] Phone: [phone] Mobile Phone: [phone] Relation: Spouse Secondary Emergency Contact: Wayna Chalet [place] Phone: [phone] Relation: Son   No Known Allergies  Chief Complaint  Patient presents with  . New Admit To SNF    New Admission Visit     HPI:  Patient is a 82 y.o. male seen today for short term rehabilitation post hospital admission from 01/05/16-01/12/16 with acute respiratory failure from COPD exacerbation and Right upper lobe pneumonia. he was started on antibiotics, oxygen and iv steroid.he was later switched to po prednisone. he had right sided pleural effusion thought to be from CHF. he underwent right sided thoracocentesis and 1.1 litre fluid was removed. he was also diuresed. he has medical history of HTN, HLD, diastolic CHF among others.   Review of Systems:  Constitutional: Negative for fever, chills, diaphoresis. Feels weak and tired. HENT: Negative for headache, congestion, nasal discharge, sore throat, difficulty swallowing.   Eyes: Negative for blurred vision, double vision and discharge. Wears glasses. Respiratory: Negative for shortness of breath and wheezing.  positive for cough.  Cardiovascular: Negative for chest pain, palpitations, leg swelling.  Gastrointestinal: Negative for heartburn, nausea, vomiting,  abdominal pain. Last bowel movement was 4 days ago. Passing gas. He has hx of chronic constipation Genitourinary: Negative for dysuria and flank pain.  Musculoskeletal: Negative for back pain, fall in the facility.  Skin: Negative for itching, rash.  Neurological: Negative for dizziness. Psychiatric/Behavioral: Negative for depression   Past Medical History:  Diagnosis Date  . Aortic valve stenosis 09/10/2012  . Arthritis   . Back pain   . Benign prostatic hypertrophy 10/18/00  . Cancer ([place])    skin cancers  . CHF (congestive heart failure) ([place]) 04/21/03  . Chronic diastolic congestive heart failure ([place])   . Colon polyps 06/05/2002   path could not be found   . Diverticulosis of colon 06/05/2002  . Hyperlipidemia 09/05/95  . Hypertension 05/19/03  . 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 Scoliosis   . Thinning of skin    bruises easy.   Past Surgical History:  Procedure Laterality Date  . CARDIAC CATHETERIZATION  10/27/2013  . COLONOSCOPY W/ BIOPSIES  05/2002  . EYE SURGERY     bilateral cataracts  . FLEXIBLE SIGMOIDOSCOPY  07/2005  . INTRAOPERATIVE TRANSESOPHAGEAL ECHOCARDIOGRAM N/A 02/03/2014   Procedure: INTRAOPERATIVE TRANSESOPHAGEAL ECHOCARDIOGRAM;  Surgeon: Sherren Mocha, MD;  Location: [facility];  Service: Open Heart Surgery;  Laterality: N/A;  . KNEE ARTHROSCOPY  06/1997   right  . TEE WITHOUT CARDIOVERSION    . TONSILLECTOMY    . TRANSCATHETER AORTIC VALVE REPLACEMENT, TRANSFEMORAL N/A 02/03/2014   Procedure: TRANSCATHETER AORTIC VALVE REPLACEMENT, TRANSFEMORAL;  Surgeon: Sherren Mocha, MD;  Location: [facility];  Service: Open Heart  Surgery;  Laterality: N/A;   Social History:   reports that he quit smoking about 45 years ago. His smoking use included Cigarettes. He has a 20.00 pack-year smoking history. He has quit using smokeless tobacco. He reports that he  does not drink alcohol or use drugs.  Family History  Problem Relation Age of Onset  . Stroke Father   . Diabetes Brother   . Hip fracture Sister     after a fall  . Diabetes Brother   . Coronary artery disease Brother     carotid stenosis  . Hypertension Other   . Colon cancer Neg Hx   . Prostate cancer Neg Hx     Medications:   Medication List       Accurate as of 01/13/16 12:57 PM. Always use your most recent med list.          amLODipine 5 MG tablet Commonly known as:  NORVASC Take 1 tablet (5 mg total) by mouth daily.   aspirin 81 MG EC tablet Take 81 mg by mouth daily.   finasteride 5 MG tablet Commonly known as:  PROSCAR Take 1 tablet (5 mg total) by mouth daily.   furosemide 20 MG tablet Commonly known as:  LASIX Take 1 tablet (20 mg total) by mouth daily.   gabapentin 100 MG capsule Commonly known as:  NEURONTIN Take 1 capsule (100 mg total) by mouth 3 (three) times daily. For burning pain   guaiFENesin-dextromethorphan 100-10 MG/5ML syrup Commonly known as:  ROBITUSSIN DM Take 5 mLs by mouth every 4 (four) hours as needed for cough.   ipratropium-albuterol 0.5-2.5 (3) MG/3ML Soln Commonly known as:  DUONEB Take 3 mLs by nebulization 3 (three) times daily.   Oxycodone HCl 10 MG Tabs Take 1 tablet (10 mg total) by mouth 3 (three) times daily as needed.   predniSONE 10 MG (21) Tbpk tablet Commonly known as:  STERAPRED UNI-PAK 21 TAB Take 1 tablet (10 mg total) by mouth daily. 6 tabs PO x 1 day 5 tabs PO x 1 day 4 tabs PO x 1 day 3 tabs PO x 1 day 2 tabs PO x 1 day 1 tab PO x 1 day and stop   TRAVATAN Z 0.004 % Soln ophthalmic solution Generic drug:  Travoprost (BAK Free) Place 1 drop into the right eye at bedtime.       Immunizations: Immunization History  Administered Date(s) Administered  . Influenza Split 01/04/2011, 12/28/2011  . Influenza Whole 12/18/2004, 12/19/2006, 12/16/2007, 12/31/2008, 12/29/2009  . Influenza,inj,Quad PF,36+  Mos 12/25/2012, 12/10/2013, 12/04/2014, 12/13/2015  . Influenza-Unspecified 02/06/2014  . PPD Test 02/06/2014  . Pneumococcal Conjugate-13 10/07/2015  . Pneumococcal Polysaccharide-23 06/08/1997  . Pneumococcal-Unspecified 02/06/2014  . Td 03/20/1993, 10/31/2000, 08/31/2014     Physical Exam:  Vitals:   01/13/16 1251  BP: (!) 108/57  Pulse: 77  Resp: 20  Temp: (!) 96.8 F (36 C)  TempSrc: Oral  SpO2: 100%  Weight: 141 lb 4.8 oz (64.1 kg)  Height: 5\' 8"  (1.727 m)   Body mass index is 21.48 kg/m.  General- elderly frail male, thin built, in no acute distress Head- normocephalic, atraumatic Nose- no nasal discharge Throat- moist mucus membrane Eyes- PERRLA, EOMI, no pallor, no icterus Neck- no cervical lymphadenopathy Cardiovascular- normal s1,s2, no murmur, trace leg edema Respiratory- bilateral clear to auscultation, no wheeze, no rhonchi, no crackles, no use of accessory muscles Abdomen- bowel sounds present, soft, non tender Musculoskeletal- able to move all 4 extremities, severe scoliosis  present Neurological-  alert and oriented to person, place and time Skin- warm and dry, senile purpura Psychiatry- normal mood and affect    Labs reviewed: Basic Metabolic Panel:  Recent Labs  01/05/16 1555 01/11/16 0259 01/12/16 0332  NA 142 140 140  K 4.1 4.4 4.1  CL 107 104 104  CO2 29 32 29  GLUCOSE 123* 132* 114*  BUN 41* 31* 31*  CREATININE 1.03 0.90 0.90  CALCIUM 8.6* 8.3* 8.3*   Liver Function Tests:  Recent Labs  06/09/15 1436 11/19/15 1321 01/05/16 1555  AST 14 13 28   ALT 14 12 41  ALKPHOS 53 57 57  BILITOT 0.5 0.8 1.7*  PROT 6.2 6.3 6.6  ALBUMIN 3.7 4.0 3.4*   No results for input(s): LIPASE, AMYLASE in the last 8760 hours. No results for input(s): AMMONIA in the last 8760 hours. CBC:  Recent Labs  06/09/15 1436 11/19/15 1321 01/05/16 1555 01/09/16 0516 01/11/16 0259  WBC 5.0 5.3 8.4 4.9 6.0  NEUTROABS 3.2 3.9 7.3*  --   --   HGB  11.1* 11.9* 11.9* 11.1* 11.4*  HCT 32.8* 35.3* 35.2* 31.8* 32.9*  MCV 89.2 90.0 91.6 88.0 90.0  PLT 146.0* 148.0* 128* 167 203   Cardiac Enzymes:  Recent Labs  01/05/16 1555 01/05/16 2335  TROPONINI 0.06* 0.06*   BNP: Invalid input(s): POCBNP CBG: No results for input(s): GLUCAP in the last 8760 hours.  Radiological Exams: Dg Chest 1 View  Result Date: 01/10/2016 CLINICAL DATA:  Post right-sided thoracentesis EXAM: CHEST 1 VIEW COMPARISON:  Chest radiograph - 01/07/2016; chest CT-01/08/2016 FINDINGS: Grossly unchanged cardiac silhouette and mediastinal contours. Post aortic valve replacement. Interval reduction/resolution of right-sided pleural effusion post thoracentesis. No pneumothorax. Slight increase in small to moderate size left-sided effusion. Improved aeration the right lung base. Worsening left basilar heterogeneous/ consolidative opacities, likely atelectasis. Bilateral pleural calcifications, unchanged. No pneumothorax. No definite evidence of edema. No acute osseus abnormalities. Degenerative change of the bilateral glenohumeral joints, left greater than right, incompletely evaluated. IMPRESSION: 1. Interval reduction/resolution of right-sided effusion post thoracentesis. No pneumothorax. 2. Slight increase in small to moderate size left-sided effusion with worsening left basilar opacities, likely atelectasis. 3. Sequela of prior asbestos exposure. Electronically Signed   By: Sandi Mariscal M.D.   On: 01/10/2016 15:23   Dg Chest 2 View  Result Date: 01/11/2016 CLINICAL DATA:  Pneumonia. EXAM: CHEST  2 VIEW COMPARISON:  01/10/2016 FINDINGS: Prior aortic valve repair. Cardiomegaly. Moderate left pleural effusion, slightly decreased since prior study with left lower lobe atelectasis or infiltrate. Small right pleural effusions suspected. Calcified pleural plaques noted bilaterally. IMPRESSION: Moderate left pleural effusion and small right pleural effusion. The left pleural effusion  has decreased since prior study. Left lower lobe atelectasis or infiltrate. Cardiomegaly. Calcified pleural plaques. Electronically Signed   By: Rolm Baptise M.D.   On: 01/11/2016 15:09   Dg Chest 2 View  Result Date: 01/07/2016 CLINICAL DATA:  Hypoxia and shortness of breath. EXAM: CHEST  2 VIEW COMPARISON:  01/05/2016 CT and chest radiograph.  Prior studies FINDINGS: Cardiomegaly, aortic valve replacement and bilateral pleural effusions again noted. Slightly decreased pulmonary edema noted. Slightly increased bibasilar atelectasis noted. There is no evidence of pneumothorax. IMPRESSION: Slightly increased bibasilar atelectasis. Slightly improved interstitial edema. Unchanged cardiomegaly and bilateral pleural effusions. Electronically Signed   By: Margarette Canada M.D.   On: 01/07/2016 13:03   Dg Chest 2 View  Result Date: 01/05/2016 CLINICAL DATA:  Cough and worsening shortness of breath earlier  today. EXAM: CHEST  2 VIEW COMPARISON:  02/04/2014; 02/03/2014 FINDINGS: Grossly unchanged enlarged cardiac silhouette and mediastinal contours with atherosclerotic plaque within thoracic aorta. Post aortic valve replacement. Pulmonary vasculature appears less distinct than present examination with cephalization of flow. Interval development of a small potentially partially loculated left-sided effusion with associated worsening left basilar opacities. Pleural calcifications are again seen bilaterally. No pneumothorax. No acute osseus abnormalities. Moderate scoliotic curvature of the thoracolumbar spine. Mild-to-moderate degenerative change of the bilateral glenohumeral joints. IMPRESSION: 1. Findings worrisome for pulmonary edema with associated potentially partially loculated left-sided effusion and worsening left basilar opacities, atelectasis versus infiltrate. A follow-up chest radiograph in 3 to 4 weeks after treatment is recommended to ensure resolution. 2. Re- demonstrated bilateral pleural calcifications  compatible with prior asbestos exposure. Electronically Signed   By: Sandi Mariscal M.D.   On: 01/05/2016 16:40   Ct Chest Wo Contrast  Result Date: 01/05/2016 CLINICAL DATA:  Cough and shortness of breath for 2 days, respiratory distress, pulmonary edema and/or pneumonia on chest radiography, hypoxemia EXAM: CT CHEST WITHOUT CONTRAST TECHNIQUE: Multidetector CT imaging of the chest was performed following the standard protocol without IV contrast. COMPARISON:  Chest radiographs 01/05/2016 FINDINGS: Cardiovascular: Atherosclerotic calcifications at the aorta, coronary arteries, and proximal great vessels. Post TAVR. Minimal pericardial effusion. Aorta normal caliber. Mediastinum/Nodes: No definite thoracic adenopathy, hilar assessment limited by lack of IV contrast. Esophagus unremarkable. Lungs/Pleura: Extensive calcified pleural plaques bilaterally consistent with asbestos exposure. BILATERAL pleural effusions, moderate RIGHT and small LEFT. Peribronchial thickening. Central pulmonary infiltrates question edema versus infection. Additional focal opacity RIGHT upper lobe 18 x 13 mm image 46 may represent more focal consolidation or edema though followup until resolution required to exclude underlying neoplasm. Several additional subtle areas of more focal opacity are seen particularly in the RIGHT upper lobe notably image 54. No pneumothorax. Compressive atelectasis of both lower lobes. Upper Abdomen: Large cyst LEFT lobe liver 6.5 x 5.2 cm image 145. Small cyst RIGHT lobe liver 7 mm diameter image 132. Musculoskeletal: Osseous demineralization. Significant rotatory dextroconvex scoliosis thoracic spine. Scattered degenerative changes thoracic spine. IMPRESSION: Extensive atherosclerotic disease including aortic atherosclerosis and coronary arterial calcification. Calcified pleural plaques bilaterally consistent with asbestos exposure. Post TAVR. BILATERAL pulmonary infiltrates greatest in upper lobes which could  represent edema or infection, with more focal areas of opacity particularly in the RIGHT upper lobe as described above; followup CT imaging recommended in 3 months to confirm resolution of these focal opacities and exclude tumor. BILATERAL pleural effusions RIGHT greater than LEFT. Hepatic cysts. Electronically Signed   By: Lavonia Dana M.D.   On: 01/05/2016 17:37   Ct Chest W Contrast  Result Date: 01/08/2016 CLINICAL DATA:  Continued shortness of breath and coughing. EXAM: CT CHEST WITH CONTRAST TECHNIQUE: Multidetector CT imaging of the chest was performed during intravenous contrast administration. CONTRAST:  34mL ISOVUE-300 IOPAMIDOL (ISOVUE-300) INJECTION 61% COMPARISON:  Chest CT 01/05/2016 FINDINGS: Cardiovascular: Prior transcatheter aortic valve replacement. Normal caliber of the thoracic aorta without dissection. The great vessels are patent. Main and central pulmonary arteries are patent without a large pulmonary embolism. Aortic atherosclerotic calcifications. Coronary artery calcifications. Mediastinum/Nodes: There is no significant chest lymphadenopathy. No significant pericardial fluid. Lungs/Pleura: Bilateral pleural effusions. Left pleural effusion is small for size and the right pleural effusion is moderate in size. Right pleural effusion has slightly enlarged compared to the recent comparison examination. Again noted are diffuse pleural plaques with calcifications. Pleural calcifications are suggestive for prior asbestos exposure. The trachea and  mainstem bronchi are patent. There is increased compressive atelectasis in the right lower lobe compared to the recent comparison examination. Again noted are areas of airspace disease in the upper lobes. Airspace disease in the right upper lobe is more confluent and has progressed. Airspace disease in the left upper lobe has minimally changed. There is increased volume loss in the left lower lobe compared to the recent comparison exam. Upper  Abdomen: Again noted is a large cyst in the left hepatic lobe, measuring up to 6.9 cm. Evidence for additional hypodense cysts in the liver. Main portal venous system is patent. No acute abnormality in the upper abdomen. Abdominal aorta is heavily calcified. Musculoskeletal: Again noted is severe dextroscoliosis in the lower thoracic spine with degenerative changes in the upper lumbar spine. IMPRESSION: Bilateral pleural effusions with enlargement of the mild right pleural effusion. Increased airspace disease in the right upper lung is most likely related to pneumonia. Airspace disease in left upper lung has not significantly changed. There is increased volume loss in both lower lobes. Again noted are diffuse pleural plaques with calcifications. Findings compatible with prior asbestos exposure. Electronically Signed   By: Markus Daft M.D.   On: 01/08/2016 14:26   US Thoracentesis Asp Pleural Space W/img Guide  Result Date: 01/10/2016 INDICATION: Symptomatic right sided pleural effusion - please perform ultrasound-guided thoracentesis for diagnostic and therapeutic purposes EXAM: US THORACENTESIS ASP PLEURAL SPACE W/IMG GUIDE COMPARISON:  Chest radiograph - 01/07/2016; chest CT - 01/08/2016 MEDICATIONS: None. COMPLICATIONS: None immediate. TECHNIQUE: Informed written consent was obtained from the patient after a discussion of the risks, benefits and alternatives to treatment. A timeout was performed prior to the initiation of the procedure. Initial ultrasound scanning demonstrates a large anechoic right-sided pleural effusion. The lower chest was prepped and draped in the usual sterile fashion. 1% lidocaine was used for local anesthesia. An ultrasound image was saved for documentation purposes. An 8 Fr Safe-T-Centesis catheter was introduced. The thoracentesis was performed. The catheter was removed and a dressing was applied. The patient tolerated the procedure well without immediate post procedural  complication. The patient was escorted to have an upright chest radiograph. FINDINGS: A total of approximately 1.1 liters of serous fluid was removed. Requested samples were sent to the laboratory. IMPRESSION: Successful ultrasound-guided right sided thoracentesis yielding 1.1 liters of pleural fluid. Electronically Signed   By: Sandi Mariscal M.D.   On: 01/10/2016 15:24    Assessment/Plan  Physical deconditioning With weakness. Will have patient work with PT/OT as tolerated to regain strength and restore function.  Fall precautions are in place.  Copd exacerbation Breathing stable at present, off oxygen. Continue and complete prednisone taper from 60 mg daily to 10 mg daily and stop. Continue duoneb tid and robitussin dm 5 ml q4h prn cough.  Right upper lobe pneumonia Afebrile. Has completed antibiotic. Monitor clinically  Anemia of chronic disease Monitor cbc  Moderate protein calorie malnutrition RD consult, weight monitor  Stage 1 pressure ulcer To his back, provide preventative dressing and wound care  HTN Monitor bp, continue norvasc  Chronic constipation Start senna s 2 tab qhs and monitor  BPH Continue finasteride  Diastolic chf Stable, continue lasix 20 mg daily  Chronic pain Stable, continue oxycodone 10 mg q8h prn and monitor  Neuropathic pain Continue gabapentin 100 mg tid   Goals of care: short term rehabilitation   Labs/tests ordered:  Family/ staff Communication: reviewed care plan with patient and nursing supervisor    Blanchie Serve, MD Internal  [facility] [place], [place] [address] Cell Phone (Monday-Friday 8 am - 5 pm): [phone] On Call: [phone] and follow prompts after 5 pm and on weekends Office Phone: [phone] Office Fax: [phone]

## 2016-01-14 LAB — BODY FLUID CULTURE
Culture: NO GROWTH
Gram Stain: NONE SEEN

## 2016-01-25 ENCOUNTER — Encounter: Payer: Self-pay | Admitting: Family

## 2016-01-25 ENCOUNTER — Non-Acute Institutional Stay (SKILLED_NURSING_FACILITY): Payer: Medicare Other | Admitting: Family

## 2016-01-25 DIAGNOSIS — H401110 Primary open-angle glaucoma, right eye, stage unspecified: Secondary | ICD-10-CM | POA: Diagnosis not present

## 2016-01-25 DIAGNOSIS — I110 Hypertensive heart disease with heart failure: Secondary | ICD-10-CM

## 2016-01-25 DIAGNOSIS — R600 Localized edema: Secondary | ICD-10-CM

## 2016-01-25 DIAGNOSIS — M545 Low back pain, unspecified: Secondary | ICD-10-CM

## 2016-01-25 DIAGNOSIS — I5032 Chronic diastolic (congestive) heart failure: Secondary | ICD-10-CM

## 2016-01-25 DIAGNOSIS — N138 Other obstructive and reflux uropathy: Secondary | ICD-10-CM

## 2016-01-25 DIAGNOSIS — E785 Hyperlipidemia, unspecified: Secondary | ICD-10-CM

## 2016-01-25 DIAGNOSIS — N401 Enlarged prostate with lower urinary tract symptoms: Secondary | ICD-10-CM | POA: Diagnosis not present

## 2016-01-25 NOTE — Progress Notes (Signed)
Location:  [facility] Room Number: 1205 Place of Service:  SNF [phone])  Provider: Marlowe Sax, FNP-C   PCP: Elsie Stain, MD Patient Care Team: Tonia Ghent, MD as PCP - General (Family Medicine) Minna Merritts, MD as Consulting Physician (Cardiology)  Extended Emergency Contact Information Primary Emergency Contact: Gunner,Pat Address: [address]          [place], [place] [address] [name] of [place] Phone: [phone] Mobile Phone: [phone] Relation: Spouse Secondary Emergency Contact: [place] of [place] Phone: [phone] Relation: Son  Code Status: Full Code  Goals of care:  Advanced Directive information Advanced Directives 01/25/2016  Does patient have an advance directive? Yes  Type of Advance Directive -  Does patient want to make changes to advanced directive? -  Copy of advanced directive(s) in chart? -  Would patient like information on creating an advanced directive? -     No Known Allergies  Chief Complaint  Patient presents with  . Discharge Note    Discharge from SNF     HPI:  82 y.o. male  Seen today at [facility] for discharge home. He was here for for short term rehabilitation post hospital admission from 01/05/16-01/12/16 with acute respiratory failure from COPD exacerbation and Right upper lobe pneumonia.He was treated with antibiotics, oxygen and IV steroid. He had right sided pleural effusion thought to be from CHF. He underwent right sided thoracocentesis and 1.1 liter  fluid was removed. He was also started on Furosemide. His steroids was switched to tapered dose prednisone on discharge to Rehab. He has a medical history of HTN, CHF, COPD, BPH, Hyperlipidemia, Aortic valve stenosis, chronic back pain, severe scoliosis, Kyphosis among other conditions. He is seen in his room today sitting up on a recliner watching TV. He denies any acute issues  this visit. He has worked with PT/OT now stable for discharge home.He will be discharged home with Home health PT/OT to continue with ROM, Exercise, Gait stability and muscle strengthening. He does not require any DME states has own walker at home. Home health services will be arranged by facility social worker prior to discharge. Prescription medication will be written x 1 month then patient to follow up with PCP in 1-2 weeks.Facility staff report no new concerns.      Past Medical History:  Diagnosis Date  . Aortic valve stenosis 09/10/2012  . Arthritis   . Back pain   . Benign prostatic hypertrophy 10/18/00  . Cancer ([place])    skin cancers  . CHF (congestive heart failure) ([place]) 04/21/03  . Chronic diastolic congestive heart failure ([place])   . Colon polyps 06/05/2002   path could not be found   . Diverticulosis of colon 06/05/2002  . Hyperlipidemia 09/05/95  . Hypertension 05/19/03  . 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 Scoliosis   . Thinning of skin    bruises easy.    Past Surgical History:  Procedure Laterality Date  . CARDIAC CATHETERIZATION  10/27/2013  . COLONOSCOPY W/ BIOPSIES  05/2002  . EYE SURGERY     bilateral cataracts  . FLEXIBLE SIGMOIDOSCOPY  07/2005  . INTRAOPERATIVE TRANSESOPHAGEAL ECHOCARDIOGRAM N/A 02/03/2014   Procedure: INTRAOPERATIVE TRANSESOPHAGEAL ECHOCARDIOGRAM;  Surgeon: Sherren Mocha, MD;  Location: [facility] OR;  Service: Open Heart Surgery;  Laterality: N/A;  . KNEE ARTHROSCOPY  06/1997   right  . TEE WITHOUT CARDIOVERSION    . TONSILLECTOMY    . TRANSCATHETER AORTIC VALVE REPLACEMENT, TRANSFEMORAL N/A 02/03/2014   Procedure: TRANSCATHETER AORTIC VALVE REPLACEMENT, TRANSFEMORAL;  Surgeon: Sherren Mocha, MD;  Location: [facility];  Service: Open Heart Surgery;  Laterality: N/A;      reports that he quit smoking about 45 years ago. His smoking use  included Cigarettes. He has a 20.00 pack-year smoking history. He has quit using smokeless tobacco. He reports that he does not drink alcohol or use drugs. Social History   Social History  . Marital status: Married    Spouse name: N/A  . Number of children: N/A  . Years of education: N/A   Occupational History  . retired     [employer]   Social History Main Topics  . Smoking status: Former Smoker    Packs/day: 1.00    Years: 20.00    Types: Cigarettes    Quit date: 01/11/1971  . Smokeless tobacco: Former Systems developer     Comment: quit 40 years ago  . Alcohol use No  . Drug use: No  . Sexual activity: Yes   Other Topics Concern  . Not on file   Social History Narrative   Occupation: Retired Secretary/administrator   Married (second marriage), lives with wife   Ellis Savage '51-'54.     Son died 2008/06/29, he had cirrhosis.        No Known Allergies  Pertinent  Health Maintenance Due  Topic Date Due  . INFLUENZA VACCINE  Completed  . PNA vac Low Risk Adult  Completed    Medications:   Medication List       Accurate as of 01/25/16 12:17 PM. Always use your most recent med list.          amLODipine 5 MG tablet Commonly known as:  NORVASC Take 1 tablet (5 mg total) by mouth daily.   aspirin 81 MG EC tablet Take 81 mg by mouth daily.   benzocaine 10 % mucosal gel Commonly known as:  ORAJEL Use as directed 1 application in the mouth or throat every 4 (four) hours as needed for mouth pain.   feeding supplement (PRO-STAT SUGAR FREE 64) Liqd Take 30 mLs by mouth 2 (two) times daily.   finasteride 5 MG tablet Commonly known as:  PROSCAR Take 1 tablet (5 mg total) by mouth daily.   furosemide 20 MG tablet Commonly known as:  LASIX Take 1 tablet (20 mg total) by mouth daily.   gabapentin 100 MG capsule Commonly known as:  NEURONTIN Take 1 capsule (100 mg total) by mouth 3 (three) times daily. For burning pain   guaiFENesin-dextromethorphan 100-10 MG/5ML  syrup Commonly known as:  ROBITUSSIN DM Take 5 mLs by mouth every 4 (four) hours as needed for cough.   ipratropium-albuterol 0.5-2.5 (3) MG/3ML Soln Commonly known as:  DUONEB Take 3 mLs by nebulization 3 (three) times daily.   multivitamin tablet Take 1 tablet by mouth daily.   Oxycodone HCl 10 MG Tabs Take 1 tablet (10 mg total) by mouth 3 (three) times daily as needed.   sennosides-docusate sodium 8.6-50 MG tablet Commonly known as:  SENOKOT-S Take 2 tablets by mouth at bedtime.   TRAVATAN Z 0.004 % Soln ophthalmic solution Generic drug:  Travoprost (BAK Free) Place 1 drop into the right eye at bedtime.       Review of Systems  Constitutional: Negative for activity change, appetite change, chills,  fatigue and fever.  HENT: Negative for congestion, rhinorrhea, sinus pain, sinus pressure and sore throat.   Eyes: Negative.   Respiratory: Negative for cough, chest tightness, shortness of breath and wheezing.   Cardiovascular: Positive for leg swelling. Negative for chest pain and palpitations.  Gastrointestinal: Negative for abdominal distention, abdominal pain, constipation, diarrhea, nausea and vomiting.  Endocrine: Negative.   Genitourinary: Negative for dysuria, frequency and urgency.  Musculoskeletal: Positive for back pain and gait problem.  Skin: Negative.   Neurological: Negative for dizziness, syncope, weakness, light-headedness, numbness and headaches.  Psychiatric/Behavioral: Negative for agitation, confusion, hallucinations and sleep disturbance. The patient is not nervous/anxious.     Vitals:   01/25/16 1123  BP: 112/63  Pulse: 65  Resp: 18  Temp: 97.2 F (36.2 C)  Weight: 145 lb (65.8 kg)  Height: 5\' 8"  (1.727 m)   Body mass index is 22.05 kg/m. Physical Exam  Constitutional: He is oriented to person, place, and time.  Thin elderly in no acute distress.   HENT:  Head: Normocephalic.  Mouth/Throat: Oropharynx is clear and moist. No oropharyngeal  exudate.  Eyes: Conjunctivae and EOM are normal. Pupils are equal, round, and reactive to light. Right eye exhibits no discharge. Left eye exhibits no discharge. No scleral icterus.  Neck: Normal range of motion. No JVD present. No thyromegaly present.  Cardiovascular: Normal rate and intact distal pulses.  Exam reveals no gallop and no friction rub.   Murmur heard. Pulmonary/Chest: Effort normal and breath sounds normal. No respiratory distress. He has no wheezes. He has no rales. He exhibits no tenderness.  Abdominal: Soft. Bowel sounds are normal. He exhibits no distension. There is no tenderness. There is no rebound and no guarding.  Musculoskeletal: Normal range of motion. He exhibits no edema or tenderness.  Unsteady gait. Uses FWW.Severe scoliosis and Kyphosis.    Lymphadenopathy:    He has no cervical adenopathy.  Neurological: He is oriented to person, place, and time.  Skin: Skin is warm and dry. No rash noted. No erythema. No pallor.  Psychiatric: He has a normal mood and affect.    Labs reviewed: Basic Metabolic Panel:  Recent Labs  01/05/16 1555 01/11/16 0259 01/12/16 0332  NA 142 140 140  K 4.1 4.4 4.1  CL 107 104 104  CO2 29 32 29  GLUCOSE 123* 132* 114*  BUN 41* 31* 31*  CREATININE 1.03 0.90 0.90  CALCIUM 8.6* 8.3* 8.3*   Liver Function Tests:  Recent Labs  06/09/15 1436 11/19/15 1321 01/05/16 1555  AST 14 13 28   ALT 14 12 41  ALKPHOS 53 57 57  BILITOT 0.5 0.8 1.7*  PROT 6.2 6.3 6.6  ALBUMIN 3.7 4.0 3.4*   CBC:  Recent Labs  06/09/15 1436 11/19/15 1321 01/05/16 1555 01/09/16 0516 01/11/16 0259  WBC 5.0 5.3 8.4 4.9 6.0  NEUTROABS 3.2 3.9 7.3*  --   --   HGB 11.1* 11.9* 11.9* 11.1* 11.4*  HCT 32.8* 35.3* 35.2* 31.8* 32.9*  MCV 89.2 90.0 91.6 88.0 90.0  PLT 146.0* 148.0* 128* 167 203   Cardiac Enzymes:  Recent Labs  01/05/16 1555 01/05/16 2335  TROPONINI 0.06* 0.06*   Assessment/Plan:   CHF Weight stable. Bilateral +2 edema to  lower extremities though has improved with dieuretic and knee high Ted hose. Lung fields without any wheezes, rales or crackles.continue on Furosemide. BMP in 1-2 weeks with PCP.   HTN B/p stable. Continue on Amlodipine. BMP in 1-2 weeks with PCP.   BPH  Continue on Proscar.  Edema Has improved. 2+ continue on furosemide 20 mg Tablet daily. Bilateral knee high Ted hose on in the morning and off at bedtime. BMP in 1-2 weeks with PCP.    Dyslipidemia  Continue on heart healthy diet.   Glaucoma Continue on Travatan Z eye drops. Follow up with Opthalmologist.    Back pain Continue current pain regimen. Wean off pain medication as tolerated.   PNA  Afebrile. Status post hospital admission from 01/05/16-01/12/16 with acute respiratory failure from COPD exacerbation and Right upper lobe pneumonia.He was treated with antibiotics, oxygen and IV steroid. He had right sided pleural effusion thought to be from CHF. He underwent right sided thoracocentesis and 1.1 liter  fluid was removed. He was also started on Furosemide. His steroids was switched to tapered dose prednisone on discharge to Rehab.He has completed tapered prednisone course. Continue to monitor for SOB or dyspnea. CBC/diff in 1-2 weeks with PCP.     Patient is being discharged with the following home health services:     PT/OT to continue with ROM, Exercise, Gait stability and muscle strengthening.   Patient is being discharged with the following durable medical equipment:   He does not require any DME states has own walker at home.   Patient has been advised to f/u with their PCP in 1-2 weeks to for a transitions of care visit.  Social services at their facility was responsible for arranging this appointment.  Pt was provided with adequate prescriptions of noncontrolled medications to reach the scheduled appointment .  For controlled substances, a limited supply was provided as appropriate for the individual patient.  If the pt  normally receives these medications from a pain clinic or has a contract with another physician, these medications should be received from that clinic or physician only).    Future labs/tests needed:  CBC/diff, BMP in 1-2 weeks with PCP

## 2016-01-28 ENCOUNTER — Encounter: Payer: Medicare Other | Admitting: Internal Medicine

## 2016-01-28 ENCOUNTER — Encounter: Payer: Self-pay | Admitting: Internal Medicine

## 2016-01-28 NOTE — Progress Notes (Signed)
 This encounter was created in error - please disregard.

## 2016-02-08 ENCOUNTER — Ambulatory Visit (INDEPENDENT_AMBULATORY_CARE_PROVIDER_SITE_OTHER)
Admission: RE | Admit: 2016-02-08 | Discharge: 2016-02-08 | Disposition: A | Discharge status: Home / Self Care | Payer: Medicare Other | Source: Ambulatory Visit | Attending: Family Medicine | Admitting: Family Medicine

## 2016-02-08 ENCOUNTER — Encounter: Payer: Self-pay | Admitting: Family Medicine

## 2016-02-08 ENCOUNTER — Ambulatory Visit (INDEPENDENT_AMBULATORY_CARE_PROVIDER_SITE_OTHER): Payer: Medicare Other | Admitting: Family Medicine

## 2016-02-08 VITALS — BP 142/70 | HR 80 | Temp 97.7°F | Wt 148.5 lb

## 2016-02-08 DIAGNOSIS — I509 Heart failure, unspecified: Secondary | ICD-10-CM | POA: Diagnosis not present

## 2016-02-08 DIAGNOSIS — R0602 Shortness of breath: Secondary | ICD-10-CM

## 2016-02-08 LAB — BRAIN NATRIURETIC PEPTIDE: Pro B Natriuretic peptide (BNP): 483 pg/mL — ABNORMAL HIGH (ref 0.0–100.0)

## 2016-02-08 LAB — COMPREHENSIVE METABOLIC PANEL
ALBUMIN: 3.7 g/dL (ref 3.5–5.2)
ALK PHOS: 65 U/L (ref 39–117)
ALT: 14 U/L (ref 0–53)
AST: 11 U/L (ref 0–37)
BUN: 29 mg/dL — AB (ref 6–23)
CALCIUM: 9.1 mg/dL (ref 8.4–10.5)
CO2: 31 mEq/L (ref 19–32)
Chloride: 108 mEq/L (ref 96–112)
Creatinine, Ser: 1.04 mg/dL (ref 0.40–1.50)
GFR: 71.82 mL/min (ref >60.00)
Glucose, Bld: 124 mg/dL — ABNORMAL HIGH (ref 70–99)
POTASSIUM: 4.2 meq/L (ref 3.5–5.1)
Sodium: 145 mEq/L (ref 135–145)
TOTAL PROTEIN: 6.3 g/dL (ref 6.0–8.3)
Total Bilirubin: 1 mg/dL (ref 0.2–1.2)

## 2016-02-08 LAB — CBC WITH DIFFERENTIAL/PLATELET
BASOS PCT: 0.3 % (ref 0.0–3.0)
Basophils Absolute: 0 10*3/uL (ref 0.0–0.1)
EOS PCT: 0.5 % (ref 0.0–5.0)
Eosinophils Absolute: 0 10*3/uL (ref 0.0–0.7)
HEMATOCRIT: 34.5 % — AB (ref 39.0–52.0)
HEMOGLOBIN: 11.6 g/dL — AB (ref 13.0–17.0)
LYMPHS PCT: 10.3 % — AB (ref 12.0–46.0)
Lymphs Abs: 0.9 10*3/uL (ref 0.7–4.0)
MCHC: 33.7 g/dL (ref 30.0–36.0)
MCV: 89.7 fl (ref 78.0–100.0)
Monocytes Absolute: 0.5 10*3/uL (ref 0.1–1.0)
Monocytes Relative: 6.3 % (ref 3.0–12.0)
Neutro Abs: 6.8 10*3/uL (ref 1.4–7.7)
Neutrophils Relative %: 82.6 % — ABNORMAL HIGH (ref 43.0–77.0)
Platelets: 187 10*3/uL (ref 150.0–400.0)
RBC: 3.85 Mil/uL — ABNORMAL LOW (ref 4.22–5.81)
RDW: 14.5 % (ref 11.5–15.5)
WBC: 8.3 10*3/uL (ref 4.0–10.5)

## 2016-02-08 MED ORDER — OXYCODONE HCL 10 MG PO TABS: 10.0000 mg | ORAL_TABLET | Freq: Three times a day (TID) | ORAL | 0 refills | Status: DC | PRN

## 2016-02-08 NOTE — Patient Instructions (Signed)
Go to the lab on the way out.  We'll contact you with your lab and xray report. Take care.  Glad to see you.  Update me as needed.   

## 2016-02-08 NOTE — Progress Notes (Signed)
Pre visit review using our clinic review tool, if applicable. No additional management support is needed unless otherwise documented below in the visit note. 

## 2016-02-08 NOTE — Progress Notes (Signed)
Inpatient follow-up, rehabilitation follow-up. Admitted with respiratory failure with COPD exacerbation and pneumonia. Treated with nebulizer, steroids, antibiotics. Concurrent right-sided pleural effusion that was increased in size, thought to be secondary to diastolic CHF. This was drained as an inpatient. He was weaned off of oxygen. His status improved to the point where he was able to go for short rehabilitation stay. Now back home. SOB improved, still with some likely at baseline due to profound scoliosis.   Living at home, usually by himself. Here today with son at the office visit. Discussed with patient about housing options and also meals. Offered get patient set up on Meals on Wheels, this was declined. I asked the patient what I could do at this point to try to help with his home situation and he said he would consider his options. I think he was to continue living at home. Compliant with medications in the meantime. No adverse effect on current medications.  PMH and SH reviewed  ROS: Per HPI unless specifically indicated in ROS section   Meds, vitals, and allergies reviewed.   Nad, thin appearing elderly male Recheck pulse ox 95% on room air. Normocephalic/atraumatic MMM Neck supple, no LA Lungs with asymmetric breath sounds, this is at his baseline given his significant scoliosis, but no new focal decrease in breath sounds. No increased work of breathing. No wheeze. Abdomen soft Extremities with trace ankle edema B

## 2016-02-09 ENCOUNTER — Emergency Department: Payer: Medicare Other

## 2016-02-09 ENCOUNTER — Encounter: Payer: Self-pay | Admitting: Family Medicine

## 2016-02-09 ENCOUNTER — Inpatient Hospital Stay (HOSPITAL_COMMUNITY)
Admit: 2016-02-09 | Discharge: 2016-02-09 | Disposition: A | Discharge status: Home / Self Care | Payer: Medicare Other | Attending: Internal Medicine | Admitting: Internal Medicine

## 2016-02-09 ENCOUNTER — Inpatient Hospital Stay: Payer: Medicare Other

## 2016-02-09 ENCOUNTER — Inpatient Hospital Stay
Admission: EM | Admit: 2016-02-09 | Discharge: 2016-02-13 | DRG: 064 | Disposition: A | Discharge status: Skilled Nursing Facility | Payer: Medicare Other | Attending: Internal Medicine | Admitting: Internal Medicine

## 2016-02-09 DIAGNOSIS — I639 Cerebral infarction, unspecified: Secondary | ICD-10-CM | POA: Diagnosis present

## 2016-02-09 DIAGNOSIS — K219 Gastro-esophageal reflux disease without esophagitis: Secondary | ICD-10-CM | POA: Diagnosis present

## 2016-02-09 DIAGNOSIS — Z77090 Contact with and (suspected) exposure to asbestos: Secondary | ICD-10-CM | POA: Diagnosis present

## 2016-02-09 DIAGNOSIS — M419 Scoliosis, unspecified: Secondary | ICD-10-CM | POA: Diagnosis present

## 2016-02-09 DIAGNOSIS — Z8249 Family history of ischemic heart disease and other diseases of the circulatory system: Secondary | ICD-10-CM

## 2016-02-09 DIAGNOSIS — I110 Hypertensive heart disease with heart failure: Secondary | ICD-10-CM | POA: Diagnosis present

## 2016-02-09 DIAGNOSIS — Z87891 Personal history of nicotine dependence: Secondary | ICD-10-CM | POA: Diagnosis not present

## 2016-02-09 DIAGNOSIS — R4781 Slurred speech: Secondary | ICD-10-CM | POA: Diagnosis present

## 2016-02-09 DIAGNOSIS — I69351 Hemiplegia and hemiparesis following cerebral infarction affecting right dominant side: Secondary | ICD-10-CM

## 2016-02-09 DIAGNOSIS — Z86010 Personal history of colonic polyps: Secondary | ICD-10-CM | POA: Diagnosis not present

## 2016-02-09 DIAGNOSIS — H409 Unspecified glaucoma: Secondary | ICD-10-CM | POA: Diagnosis present

## 2016-02-09 DIAGNOSIS — N400 Enlarged prostate without lower urinary tract symptoms: Secondary | ICD-10-CM | POA: Diagnosis present

## 2016-02-09 DIAGNOSIS — Z833 Family history of diabetes mellitus: Secondary | ICD-10-CM

## 2016-02-09 DIAGNOSIS — I63512 Cerebral infarction due to unspecified occlusion or stenosis of left middle cerebral artery: Secondary | ICD-10-CM | POA: Diagnosis present

## 2016-02-09 DIAGNOSIS — Z85828 Personal history of other malignant neoplasm of skin: Secondary | ICD-10-CM | POA: Diagnosis not present

## 2016-02-09 DIAGNOSIS — H3530 Unspecified macular degeneration: Secondary | ICD-10-CM | POA: Diagnosis present

## 2016-02-09 DIAGNOSIS — Z952 Presence of prosthetic heart valve: Secondary | ICD-10-CM

## 2016-02-09 DIAGNOSIS — I350 Nonrheumatic aortic (valve) stenosis: Secondary | ICD-10-CM | POA: Diagnosis present

## 2016-02-09 DIAGNOSIS — I5032 Chronic diastolic (congestive) heart failure: Secondary | ICD-10-CM | POA: Diagnosis present

## 2016-02-09 DIAGNOSIS — Z7982 Long term (current) use of aspirin: Secondary | ICD-10-CM | POA: Diagnosis not present

## 2016-02-09 DIAGNOSIS — I272 Pulmonary hypertension, unspecified: Secondary | ICD-10-CM | POA: Diagnosis present

## 2016-02-09 DIAGNOSIS — R29810 Facial weakness: Secondary | ICD-10-CM | POA: Diagnosis present

## 2016-02-09 DIAGNOSIS — E785 Hyperlipidemia, unspecified: Secondary | ICD-10-CM | POA: Diagnosis present

## 2016-02-09 DIAGNOSIS — J690 Pneumonitis due to inhalation of food and vomit: Secondary | ICD-10-CM | POA: Diagnosis present

## 2016-02-09 DIAGNOSIS — Z823 Family history of stroke: Secondary | ICD-10-CM | POA: Diagnosis not present

## 2016-02-09 DIAGNOSIS — R262 Difficulty in walking, not elsewhere classified: Secondary | ICD-10-CM

## 2016-02-09 DIAGNOSIS — J90 Pleural effusion, not elsewhere classified: Secondary | ICD-10-CM | POA: Diagnosis present

## 2016-02-09 DIAGNOSIS — Z79899 Other long term (current) drug therapy: Secondary | ICD-10-CM

## 2016-02-09 LAB — COMPREHENSIVE METABOLIC PANEL WITH GFR
ALT: 16 U/L — ABNORMAL LOW (ref 17–63)
AST: 22 U/L (ref 15–41)
Albumin: 3.7 g/dL (ref 3.5–5.0)
Alkaline Phosphatase: 64 U/L (ref 38–126)
Anion gap: 6 (ref 5–15)
BUN: 29 mg/dL — ABNORMAL HIGH (ref 6–20)
CO2: 27 mmol/L (ref 22–32)
Calcium: 8.7 mg/dL — ABNORMAL LOW (ref 8.9–10.3)
Chloride: 110 mmol/L (ref 101–111)
Creatinine, Ser: 0.98 mg/dL (ref 0.61–1.24)
GFR calc Af Amer: >60 mL/min
GFR calc non Af Amer: >60 mL/min
Glucose, Bld: 101 mg/dL — ABNORMAL HIGH (ref 65–99)
Potassium: 3.8 mmol/L (ref 3.5–5.1)
Sodium: 143 mmol/L (ref 135–145)
Total Bilirubin: 1.2 mg/dL (ref 0.3–1.2)
Total Protein: 6.5 g/dL (ref 6.5–8.1)

## 2016-02-09 LAB — DIFFERENTIAL
Basophils Absolute: 0 K/uL (ref 0–0.1)
Basophils Relative: 0 %
Eosinophils Absolute: 0 K/uL (ref 0–0.7)
Eosinophils Relative: 1 %
Lymphocytes Relative: 10 %
Lymphs Abs: 0.7 K/uL — ABNORMAL LOW (ref 1.0–3.6)
Monocytes Absolute: 0.6 K/uL (ref 0.2–1.0)
Monocytes Relative: 8 %
Neutro Abs: 6.2 K/uL (ref 1.4–6.5)
Neutrophils Relative %: 81 %

## 2016-02-09 LAB — TROPONIN I
Troponin I: 0.05 ng/mL
Troponin I: 0.05 ng/mL (ref <0.03)

## 2016-02-09 LAB — CBC
HCT: 34.5 % — ABNORMAL LOW (ref 40.0–52.0)
Hemoglobin: 12.1 g/dL — ABNORMAL LOW (ref 13.0–18.0)
MCH: 31.1 pg (ref 26.0–34.0)
MCHC: 35 g/dL (ref 32.0–36.0)
MCV: 88.9 fL (ref 80.0–100.0)
PLATELETS: 175 10*3/uL (ref 150–440)
RBC: 3.88 MIL/uL — ABNORMAL LOW (ref 4.40–5.90)
RDW: 14.3 % (ref 11.5–14.5)
WBC: 7.6 10*3/uL (ref 3.8–10.6)

## 2016-02-09 LAB — PROTIME-INR
INR: 1.24
PROTHROMBIN TIME: 15.7 s — AB (ref 11.4–15.2)

## 2016-02-09 LAB — ECHOCARDIOGRAM COMPLETE: Weight: 2347.2 oz

## 2016-02-09 LAB — GLUCOSE, CAPILLARY: Glucose-Capillary: 89 mg/dL (ref 65–99)

## 2016-02-09 LAB — APTT: APTT: 35 s (ref 24–36)

## 2016-02-09 MED ORDER — ASPIRIN EC 325 MG PO TBEC
325.0000 mg | DELAYED_RELEASE_TABLET | Freq: Every day | ORAL | Status: DC
  Administered 2016-02-10 – 2016-02-13 (×4): 325 mg via ORAL
  Filled 2016-02-09 (×4): qty 1

## 2016-02-09 MED ORDER — ASPIRIN 81 MG PO CHEW
324.0000 mg | CHEWABLE_TABLET | Freq: Once | ORAL | Status: AC
  Administered 2016-02-09: 324 mg via ORAL
  Filled 2016-02-09: qty 4

## 2016-02-09 MED ORDER — LEVOFLOXACIN IN D5W 750 MG/150ML IV SOLN
750.0000 mg | Freq: Every day | INTRAVENOUS | Status: DC
  Filled 2016-02-09: qty 150

## 2016-02-09 MED ORDER — STROKE: EARLY STAGES OF RECOVERY BOOK: Freq: Once | Status: DC

## 2016-02-09 MED ORDER — ENOXAPARIN SODIUM 40 MG/0.4ML ~~LOC~~ SOLN
40.0000 mg | SUBCUTANEOUS | Status: DC
  Administered 2016-02-09 – 2016-02-12 (×4): 40 mg via SUBCUTANEOUS
  Filled 2016-02-09 (×4): qty 0.4

## 2016-02-09 NOTE — ED Notes (Signed)
Called code stroke [phone]

## 2016-02-09 NOTE — ED Notes (Signed)
Pt taken straight to CT scan room 1.

## 2016-02-09 NOTE — Assessment & Plan Note (Signed)
With multifactorial restorative failure leading to recent hospitalization status post thoracentesis to drain pleural effusion. At this point his pulse ox is improved. On room air. Not in distress. Discussed with patient about housing options and dietary options. Offered several avenues of support, he will consider. Discussed with patient and son. At this point no change in medications. Recheck routine labs today and also chest x-ray given the recent pleural effusion. At this point still okay for outpatient follow-up. He agrees. >25 minutes spent in face to face time with patient, >50% spent in counselling or coordination of care.

## 2016-02-09 NOTE — ED Notes (Signed)
Hooked pt back up to cardiac monitor and gave warm blanket.

## 2016-02-09 NOTE — Consult Note (Signed)
Reason for Consult:R sided weakness  Referring Physician: Dr. Margaretmary Eddy  CC: R sided weakness  HPI: Douglas Flax Musick Sr. is an 82 y.o. male with history of CHF, hypertension, hyperlipidemia brought  into the ED for right-sided weakness and facial droop with slurry speech. Pt seemed normal night prior   Past Medical History:  Diagnosis Date  . Aortic valve stenosis 09/10/2012  . Arthritis   . Back pain   . Benign prostatic hypertrophy 10/18/00  . Cancer ([place])    skin cancers  . CHF (congestive heart failure) ([place]) 04/21/03  . Chronic diastolic congestive heart failure ([place])   . Colon polyps 06/05/2002   path could not be found   . Diverticulosis of colon 06/05/2002  . Hyperlipidemia 09/05/95  . Hypertension 05/19/03  . 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 Scoliosis   . Thinning of skin    bruises easy.    Past Surgical History:  Procedure Laterality Date  . CARDIAC CATHETERIZATION  10/27/2013  . COLONOSCOPY W/ BIOPSIES  05/2002  . EYE SURGERY     bilateral cataracts  . FLEXIBLE SIGMOIDOSCOPY  07/2005  . INTRAOPERATIVE TRANSESOPHAGEAL ECHOCARDIOGRAM N/A 02/03/2014   Procedure: INTRAOPERATIVE TRANSESOPHAGEAL ECHOCARDIOGRAM;  Surgeon: Sherren Mocha, MD;  Location: [facility] OR;  Service: Open Heart Surgery;  Laterality: N/A;  . KNEE ARTHROSCOPY  06/1997   right  . PLEURAL EFFUSION DRAINAGE     2017  . TEE WITHOUT CARDIOVERSION    . TONSILLECTOMY    . TRANSCATHETER AORTIC VALVE REPLACEMENT, TRANSFEMORAL N/A 02/03/2014   Procedure: TRANSCATHETER AORTIC VALVE REPLACEMENT, TRANSFEMORAL;  Surgeon: Sherren Mocha, MD;  Location: [facility];  Service: Open Heart Surgery;  Laterality: N/A;    Family History  Problem Relation Age of Onset  . Stroke Father   . Diabetes Brother   . Hip fracture Sister     after a fall  . Diabetes Brother   . Coronary artery disease Brother    carotid stenosis  . Hypertension Other   . Colon cancer Neg Hx   . Prostate cancer Neg Hx     Social History:  reports that he quit smoking about 45 years ago. His smoking use included Cigarettes. He has a 20.00 pack-year smoking history. He has quit using smokeless tobacco. He reports that he does not drink alcohol or use drugs.  No Known Allergies  Medications: I have reviewed the patient's current medications.  ROS: History obtained from the patient  General ROS: negative for - chills, fatigue, fever, night sweats, weight gain or weight loss Psychological ROS: negative for - behavioral disorder, hallucinations, memory difficulties, mood swings or suicidal ideation Ophthalmic ROS: negative for - blurry vision, double vision, eye pain or loss of vision ENT ROS: negative for - epistaxis, nasal discharge, oral lesions, sore throat, tinnitus or vertigo Allergy and Immunology ROS: negative for - hives or itchy/watery eyes Hematological and Lymphatic ROS: negative for - bleeding problems, bruising or swollen lymph nodes Endocrine ROS: negative for - galactorrhea, hair pattern changes, polydipsia/polyuria or temperature intolerance Respiratory ROS: negative for - cough, hemoptysis, shortness of breath or wheezing Cardiovascular ROS: negative for - chest pain, dyspnea on exertion, edema or irregular heartbeat Gastrointestinal ROS: negative for - abdominal pain, diarrhea, hematemesis, nausea/vomiting or stool incontinence Genito-Urinary ROS: negative for - dysuria, hematuria, incontinence or urinary frequency/urgency Musculoskeletal ROS: negative for - joint swelling or muscular weakness Neurological ROS: as  noted in HPI Dermatological ROS: negative for rash and skin lesion changes  Physical Examination: Blood pressure (!) 146/92, pulse 76, temperature 98.1 F (36.7 C), temperature source Oral, resp. rate 19, height 5\' 9"  (1.753 m), weight 67.6 kg (149 lb), SpO2 94 %.    Neurological  Examination Mental Status: Confusion. Tells me place but not time and date Cranial Nerves: II: Discs flat bilaterally; Visual fields grossly normal, pupils equal, round, reactive to light and accommodation III,IV, VI: ptosis not present, extra-ocular motions intact bilaterally V,VII: R facial droop  VIII: hearing normal bilaterally IX,X: gag reflex present XI: bilateral shoulder shrug XII: midline tongue extension Motor: Right : Upper extremity   4+/5    Left:     Upper extremity   5/5  Lower extremity   4/5     Lower extremity   5/5 Tone and bulk:normal tone throughout; no atrophy noted Sensory: Pinprick and light touch intact throughout, bilaterally Deep Tendon Reflexes: 1+ and symmetric throughout Plantars: Right: downgoing   Left: downgoing Cerebellar: normal finger-to-nose, normal rapid alternating movements and normal heel-to-shin test Gait: not tested.       Laboratory Studies:   Basic Metabolic Panel:  Recent Labs Lab 02/08/16 1327 02/09/16 1305  NA 145 143  K 4.2 3.8  CL 108 110  CO2 31 27  GLUCOSE 124* 101*  BUN 29* 29*  CREATININE 1.04 0.98  CALCIUM 9.1 8.7*    Liver Function Tests:  Recent Labs Lab 02/08/16 1327 02/09/16 1305  AST 11 22  ALT 14 16*  ALKPHOS 65 64  BILITOT 1.0 1.2  PROT 6.3 6.5  ALBUMIN 3.7 3.7   No results for input(s): LIPASE, AMYLASE in the last 168 hours. No results for input(s): AMMONIA in the last 168 hours.  CBC:  Recent Labs Lab 02/08/16 1327 02/09/16 1305  WBC 8.3 7.6  NEUTROABS 6.8 6.2  HGB 11.6* 12.1*  HCT 34.5* 34.5*  MCV 89.7 88.9  PLT 187.0 175    Cardiac Enzymes:  Recent Labs Lab 02/09/16 1305 02/09/16 1700  TROPONINI 0.05* 0.05*    BNP: Invalid input(s): POCBNP  CBG:  Recent Labs Lab 02/09/16 1310  GLUCAP 89    Microbiology: Results for orders placed or performed during the hospital encounter of 01/05/16  Blood culture (routine x 2)     Status: None   Collection Time: 01/05/16   5:35 PM  Result Value Ref Range Status   Specimen Description BLOOD LEFT WRIST  Final   Special Requests BOTTLES DRAWN AEROBIC AND ANAEROBIC AER 5CC 5CC  Final   Culture NO GROWTH 5 DAYS  Final   Report Status 01/10/2016 FINAL  Final  Blood culture (routine x 2)     Status: None   Collection Time: 01/05/16  5:35 PM  Result Value Ref Range Status   Specimen Description BLOOD RIGHT AC  Final   Special Requests BOTTLES DRAWN AEROBIC AND ANAEROBIC AER4CC ANA 3CC  Final   Culture NO GROWTH 5 DAYS  Final   Report Status 01/10/2016 FINAL  Final  Culture, sputum-assessment     Status: None   Collection Time: 01/06/16  9:45 AM  Result Value Ref Range Status   Specimen Description EXPECTORATED SPUTUM  Final   Special Requests NONE  Final   Sputum evaluation   Final    Sputum specimen not acceptable for testing.  Please recollect.   SPOKE WITH MELANIE [facility] AT 1130 01/06/16 SDR    Report Status 01/06/2016 FINAL  Final  Body  fluid culture     Status: None   Collection Time: 01/10/16  2:28 PM  Result Value Ref Range Status   Specimen Description FLUID PLEURAL  Final   Special Requests NONE  Final   Gram Stain NO WBC SEEN NO ORGANISMS SEEN   Final   Culture   Final    NO GROWTH 3 DAYS Performed at [facility]    Report Status 01/14/2016 FINAL  Final    Coagulation Studies:  Recent Labs  02/09/16 1305  LABPROT 15.7*  INR 1.24    Urinalysis: No results for input(s): COLORURINE, LABSPEC, PHURINE, GLUCOSEU, HGBUR, BILIRUBINUR, KETONESUR, PROTEINUR, UROBILINOGEN, NITRITE, LEUKOCYTESUR in the last 168 hours.  Invalid input(s): APPERANCEUR  Lipid Panel:     Component Value Date/Time   CHOL 126 05/05/2014 1303   TRIG 51.0 05/05/2014 1303   HDL 49.80 05/05/2014 1303   CHOLHDL 3 05/05/2014 1303   VLDL 10.2 05/05/2014 1303   LDLCALC 66 05/05/2014 1303    HgbA1C:  Lab Results  Component Value Date   HGBA1C 5.5 01/30/2014    Urine Drug Screen:  No results found for:  LABOPIA, COCAINSCRNUR, LABBENZ, AMPHETMU, THCU, LABBARB  Alcohol Level: No results for input(s): ETH in the last 168 hours.  Other results:  Dg Chest 2 View  Result Date: 02/09/2016 CLINICAL DATA:  Right-sided weakness and facial droop EXAM: CHEST  2 VIEW COMPARISON:  02/08/2016 FINDINGS: Valvular prosthesis again visualized. Bilateral calcified pleural plaques. Bilateral calcified nodules unchanged. No change in small bilateral pleural effusions. Stable mild cardiomegaly with mild central congestion. No pneumothorax. IMPRESSION: 1. No significant interval change in small bilateral pleural effusions and mild left basilar opacity. 2. Stable mild cardiomegaly.  There is mild central congestion. 3. Calcified pleural plaques and small calcified lung nodules as before. Electronically Signed   By: Donavan Foil M.D.   On: 02/09/2016 20:44   Dg Chest 2 View  Result Date: 02/08/2016 CLINICAL DATA:  Shortness of breath, history of pneumonia EXAM: CHEST  2 VIEW COMPARISON:  01/11/2016, 01/10/2016, 02/04/2014 FINDINGS: There are small bilateral pleural effusions, unchanged on the right and decreased on the left. Overall improved aeration of the left lung base with residual airspace opacity noted. Stable cardiomediastinal silhouette with stent. Bilateral nodular opacities and calcified pleural plaques are grossly unchanged. Atherosclerosis. No pneumothorax. IMPRESSION: 1. Small right-sided pleural effusion, unchanged. Small left-sided pleural effusion, decreased compared to prior. 2. Mild residual left basilar opacity, atelectasis versus infiltrate. 3. Otherwise no significant interval changes. Electronically Signed   By: Donavan Foil M.D.   On: 02/08/2016 22:49   Ct Head Code Stroke W/o Cm  Result Date: 02/09/2016 CLINICAL DATA:  Code stroke. 82 year old male with confusion right side weakness facial droop and slurred speech. Initial encounter. EXAM: CT HEAD WITHOUT CONTRAST TECHNIQUE: Contiguous axial  images were obtained from the base of the skull through the vertex without intravenous contrast. COMPARISON:  None. FINDINGS: Brain: Positive for cytotoxic edema in the left MCA territory at the frontal operculum and involving a small portion of the anterior left insula. No associated hemorrhage or mass effect. Lentiform nuclei and internal capsule appear spared. Posterior insula and operculum are spared. No temporal lobe involvement is evident. No superior left MCA territory involvement. Normal gray-white matter differentiation in the right hemisphere and posterior fossa. No ventriculomegaly. Patent basilar cisterns. No intracranial mass effect. No acute intracranial hemorrhage identified. Vascular: Calcified atherosclerosis at the skull base. Positive for abnormal hyperdensity at the distal left MCA M1 segment  and left MCA bifurcation (series 6, image 43). Skull: No acute osseous abnormality identified. Sinuses/Orbits: Clear. Other: No acute orbit or scalp soft tissue findings. ASPECTS [facility] Stroke Program Early CT Score) - Ganglionic level infarction (caudate, lentiform nuclei, internal capsule, insula, M1-M3 cortex): 5 (minus 2 for the insula and M1 area). - Supraganglionic infarction (M4-M6 cortex): 3 Total score (0-10 with 10 being normal): 8 IMPRESSION: 1. Positive for a left MCA infarct with operculum cytotoxic edema, no associated hemorrhage. Positive hyperdense distal Left M1 suggesting Emergent Large Vessel Occlusion. 2. ASPECTS is 8. 3. Study discussed by telephone with Dr. Meade Maw on 02/09/2016 at 13:13 . Electronically Signed   By: Genevie Ann M.D.   On: 02/09/2016 13:14     Assessment/Plan:  82 y.o. male with history of CHF, hypertension, hyperlipidemia brought  into the ED for right-sided weakness and facial droop with slurry speech. Pt seemed normal night prior  L MCA infarct on the CT Awaiting MRI/MRA ASA  CAP-was started on levoflaxacin Pt/ot Will follow   02/09/2016, 10:12 PM

## 2016-02-09 NOTE — ED Notes (Signed)
CBG 89 

## 2016-02-09 NOTE — Care Management (Signed)
[place] was responsive in conversation but still pretty out of it after having a stroke. He had a friend or family member bedside while I share about the services we offered and inquired about whether or not either one of them needs anything. They both are fine at this point and will let staff and myself know if they need or want anything else.

## 2016-02-09 NOTE — H&P (Signed)
[place] [place] NAME: Douglas Parker    MR#:  [id number]  DATE OF BIRTH:  [birth year]  DATE OF ADMISSION:  02/09/2016  PRIMARY CARE PHYSICIAN: Elsie Stain, MD   REQUESTING/REFERRING PHYSICIAN:Brian Theodosia Paling, MD  CHIEF COMPLAINT:  Trouble speaking and right-sided weakness  HISTORY OF PRESENT ILLNESS:  Douglas Parker  is a 82 y.o. male with a known history of Congestive heart failure, hypertension, hyperlipidemia and multiple other medical problems is brought into the ED for right-sided weakness and facial droop with slurry speech. According to the family members patient's symptoms started at around 4 -6 am patient is brought into the ED CT head has revealed left MCA infarct. Hospitalist team is called to admit the patient. Patient is still having slurry speech and right-sided weakness during my examination. Son at bedside. Not a TPA candidate according to the ED physician as patient came after the TPA window timeframe  PAST MEDICAL HISTORY:   Past Medical History:  Diagnosis Date  . Aortic valve stenosis 09/10/2012  . Arthritis   . Back pain   . Benign prostatic hypertrophy 10/18/00  . Cancer ([place])    skin cancers  . CHF (congestive heart failure) ([place]) 04/21/03  . Chronic diastolic congestive heart failure ([place])   . Colon polyps 06/05/2002   path could not be found   . Diverticulosis of colon 06/05/2002  . Hyperlipidemia 09/05/95  . Hypertension 05/19/03  . 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 Scoliosis   . Thinning of skin    bruises easy.    PAST SURGICAL HISTOIRY:   Past Surgical History:  Procedure Laterality Date  . CARDIAC CATHETERIZATION  10/27/2013  . COLONOSCOPY W/ BIOPSIES  05/2002  . EYE SURGERY     bilateral cataracts  . FLEXIBLE SIGMOIDOSCOPY  07/2005  . INTRAOPERATIVE TRANSESOPHAGEAL  ECHOCARDIOGRAM N/A 02/03/2014   Procedure: INTRAOPERATIVE TRANSESOPHAGEAL ECHOCARDIOGRAM;  Surgeon: Sherren Mocha, MD;  Location: [facility] OR;  Service: Open Heart Surgery;  Laterality: N/A;  . KNEE ARTHROSCOPY  06/1997   right  . PLEURAL EFFUSION DRAINAGE     2017  . TEE WITHOUT CARDIOVERSION    . TONSILLECTOMY    . TRANSCATHETER AORTIC VALVE REPLACEMENT, TRANSFEMORAL N/A 02/03/2014   Procedure: TRANSCATHETER AORTIC VALVE REPLACEMENT, TRANSFEMORAL;  Surgeon: Sherren Mocha, MD;  Location: [place];  Service: Open Heart Surgery;  Laterality: N/A;    SOCIAL HISTORY:   Social History  Substance Use Topics  . Smoking status: Former Smoker    Packs/day: 1.00    Years: 20.00    Types: Cigarettes    Quit date: 01/11/1971  . Smokeless tobacco: Former Systems developer     Comment: quit 40 years ago  . Alcohol use No    FAMILY HISTORY:   Family History  Problem Relation Age of Onset  . Stroke Father   . Diabetes Brother   . Hip fracture Sister     after a fall  . Diabetes Brother   . Coronary artery disease Brother     carotid stenosis  . Hypertension Other   . Colon cancer Neg Hx   . Prostate cancer Neg Hx     DRUG ALLERGIES:  No Known Allergies  REVIEW OF SYSTEMS:  CONSTITUTIONAL: No fever, fatigue or weakness.  EYES: No blurred or double vision.  EARS, NOSE, AND THROAT: No tinnitus or ear pain.  RESPIRATORY: No cough, shortness of breath, wheezing or hemoptysis.  CARDIOVASCULAR: No chest pain, orthopnea, edema.  GASTROINTESTINAL: No nausea, vomiting, diarrhea or abdominal pain.  GENITOURINARY: No dysuria, hematuria.  ENDOCRINE: No polyuria, nocturia,  HEMATOLOGY: No anemia, easy bruising or bleeding SKIN: No rash or lesion. MUSCULOSKELETAL: No joint pain or arthritis.   NEUROLOGIC: Slurry speech and right-sided weakness  PSYCHIATRY: No anxiety or depression.   MEDICATIONS AT HOME:   Prior to Admission medications   Medication Sig Start Date End Date Taking? Authorizing Provider   amLODipine (NORVASC) 5 MG tablet Take 1 tablet (5 mg total) by mouth daily. 01/13/16  Yes Gladstone Lighter, MD  aspirin 81 MG EC tablet Take 81 mg by mouth daily.     Yes Historical Provider, MD  atorvastatin (LIPITOR) 20 MG tablet Take 20 mg by mouth daily. 01/12/16  Yes Historical Provider, MD  finasteride (PROSCAR) 5 MG tablet Take 1 tablet (5 mg total) by mouth daily. 10/07/15  Yes Tonia Ghent, MD  furosemide (LASIX) 20 MG tablet Take 1 tablet (20 mg total) by mouth daily. 01/13/16  Yes Gladstone Lighter, MD  gabapentin (NEURONTIN) 100 MG capsule Take 1 capsule (100 mg total) by mouth 3 (three) times daily. For burning pain 10/07/15  Yes Tonia Ghent, MD      VITAL SIGNS:  Blood pressure (!) 144/68, pulse 75, resp. rate 13, SpO2 94 %.  PHYSICAL EXAMINATION:  GENERAL:  82 y.o.-year-old patient lying in the bed with no acute distress.  EYES: Pupils equal, round, reactive to light and accommodation. No scleral icterus. Extraocular muscles intact.  HEENT: Head atraumatic, normocephalic. Oropharynx and nasopharynx clear.  NECK:  Supple, no jugular venous distention. No thyroid enlargement, no tenderness.  LUNGS: Normal breath sounds bilaterally, no wheezing, rales,rhonchi or crepitation. No use of accessory muscles of respiration.  CARDIOVASCULAR: S1, S2 normal. No murmurs, rubs, or gallops.  ABDOMEN: Soft, nontender, nondistended. Bowel sounds present. No organomegaly or mass.  EXTREMITIES: No pedal edema, cyanosis, or clubbing.  NEUROLOGIC: Cranial nerves II through XII are intact. Muscle strength 4/5 in right upper And lower extremities right facial droop dysarthria but 5 out of 5 motor in left upper and lower extremities . Sensation intact. Gait not checked.  PSYCHIATRIC: The patient is alert and oriented x 3.  SKIN: No obvious rash, lesion, or ulcer.   LABORATORY PANEL:   CBC  Recent Labs Lab 02/09/16 1305  WBC 7.6  HGB 12.1*  HCT 34.5*  PLT 175    ------------------------------------------------------------------------------------------------------------------  Chemistries   Recent Labs Lab 02/09/16 1305  NA 143  K 3.8  CL 110  CO2 27  GLUCOSE 101*  BUN 29*  CREATININE 0.98  CALCIUM 8.7*  AST 22  ALT 16*  ALKPHOS 64  BILITOT 1.2   ------------------------------------------------------------------------------------------------------------------  Cardiac Enzymes  Recent Labs Lab 02/09/16 1305  TROPONINI 0.05*   ------------------------------------------------------------------------------------------------------------------  RADIOLOGY:  Dg Chest 2 View  Result Date: 02/08/2016 CLINICAL DATA:  Shortness of breath, history of pneumonia EXAM: CHEST  2 VIEW COMPARISON:  01/11/2016, 01/10/2016, 02/04/2014 FINDINGS: There are small bilateral pleural effusions, unchanged on the right and decreased on the left. Overall improved aeration of the left lung base with residual airspace opacity noted. Stable cardiomediastinal silhouette with stent. Bilateral nodular opacities and calcified pleural plaques are grossly unchanged. Atherosclerosis. No pneumothorax. IMPRESSION: 1. Small right-sided pleural effusion, unchanged. Small left-sided pleural effusion, decreased compared to prior. 2. Mild residual left basilar opacity, atelectasis versus infiltrate. 3. Otherwise no significant interval  changes. Electronically Signed   By: Donavan Foil M.D.   On: 02/08/2016 22:49   Ct Head Code Stroke W/o Cm  Result Date: 02/09/2016 CLINICAL DATA:  Code stroke. 82 year old male with confusion right side weakness facial droop and slurred speech. Initial encounter. EXAM: CT HEAD WITHOUT CONTRAST TECHNIQUE: Contiguous axial images were obtained from the base of the skull through the vertex without intravenous contrast. COMPARISON:  None. FINDINGS: Brain: Positive for cytotoxic edema in the left MCA territory at the frontal operculum and involving  a small portion of the anterior left insula. No associated hemorrhage or mass effect. Lentiform nuclei and internal capsule appear spared. Posterior insula and operculum are spared. No temporal lobe involvement is evident. No superior left MCA territory involvement. Normal gray-white matter differentiation in the right hemisphere and posterior fossa. No ventriculomegaly. Patent basilar cisterns. No intracranial mass effect. No acute intracranial hemorrhage identified. Vascular: Calcified atherosclerosis at the skull base. Positive for abnormal hyperdensity at the distal left MCA M1 segment and left MCA bifurcation (series 6, image 43). Skull: No acute osseous abnormality identified. Sinuses/Orbits: Clear. Other: No acute orbit or scalp soft tissue findings. ASPECTS [facility] Stroke Program Early CT Score) - Ganglionic level infarction (caudate, lentiform nuclei, internal capsule, insula, M1-M3 cortex): 5 (minus 2 for the insula and M1 area). - Supraganglionic infarction (M4-M6 cortex): 3 Total score (0-10 with 10 being normal): 8 IMPRESSION: 1. Positive for a left MCA infarct with operculum cytotoxic edema, no associated hemorrhage. Positive hyperdense distal Left M1 suggesting Emergent Large Vessel Occlusion. 2. ASPECTS is 8. 3. Study discussed by telephone with Dr. Meade Maw on 02/09/2016 at 13:13 . Electronically Signed   By: Genevie Ann M.D.   On: 02/09/2016 13:14    EKG:   Orders placed or performed during the hospital encounter of 02/09/16  . ED EKG  . ED EKG  . EKG 12-Lead  . EKG 12-Lead  . EKG 12-Lead  . EKG 12-Lead    IMPRESSION AND PLAN:   Douglas Parker  is a 82 y.o. male with a known history of Congestive heart failure, hypertension, hyperlipidemia and multiple other medical problems is brought into the ED for right-sided weakness and facial droop with slurry speech. According to the family members patient's symptoms started at around 4 -6 am patient is brought into the ED CT head has  revealed left MCA infarct  #Acute CVA-left MCA infarct on CT head Admitted to MedSurg unit Complete stroke workup with MRI brain and carotid Dopplers and 2-D echocardiogram Neurology consult is placed Nothing by mouth and bedside swallow evaluation PT, OT and speech therapy evaluation Check fasting lipid panel, TSH and hemoglobin A1c Aspirin by mouth after passing bedside swallow evaluation or provide rectally  #Pneumonia Clinically patient is having chills and rigors and reporting cough and shakes Chest x-ray possible infiltrate versus atelectasis Start patient on levofloxacin  #. Essential hypertension Resume home medication Norvasc and Lasix after patient passes bedside swallow evaluation  #Hyperlipidemia  fasting lipid panel in a.m. High intensity statin will be started after patient passing bedside swallow evaluation  #Benign prostatic hypertrophy Proscar will be started after patient passing bedside swallow evaluation. Currently patient is nothing by mouth  All the records are reviewed and case discussed with ED provider. Management plans discussed with the patient, SON  and they are in agreement.  CODE STATUS: FC,SON  TOTAL TIME TAKING CARE OF THIS PATIENT: 24minutes.   Note: This dictation was prepared with Dragon dictation along with smaller phrase  technology. Any transcriptional errors that result from this process are unintentional.  Nicholes Mango M.D on 02/09/2016 at 5:30 PM  Between 7am to 6pm - Pager - [phone]  After 6pm go to [email or web address] - password [email or web address] [facility]  Office  [phone]  CC: Primary care physician; Elsie Stain, MD

## 2016-02-09 NOTE — ED Provider Notes (Signed)
Time Seen: Approximately *1310  I have reviewed the triage notes  Chief Complaint: Code Stroke   History of Present Illness: Douglas Nest Shammas Sr. is a 82 y.o. male *who arrives as a code stroke. According to his family member's symptoms started anywhere from 4 to 6 AM. He had trouble speaking and some hesitancy to speak with no obvious confabulation no complaints of pain. He developed some right-sided weakness and facial droop with slurred speech. Patient has no history of previous cerebrovascular accident though has a history of aortic valve stenosis.  Past Medical History:  Diagnosis Date  . Aortic valve stenosis 09/10/2012  . Arthritis   . Back pain   . Benign prostatic hypertrophy 10/18/00  . Cancer ([place])    skin cancers  . CHF (congestive heart failure) ([place]) 04/21/03  . Chronic diastolic congestive heart failure ([place])   . Colon polyps 06/05/2002   path could not be found   . Diverticulosis of colon 06/05/2002  . Hyperlipidemia 09/05/95  . Hypertension 05/19/03  . 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 Scoliosis   . Thinning of skin    bruises easy.    Patient Active Problem List   Diagnosis Date Noted  . Pneumonia 01/05/2016  . Atypical chest pain 12/01/2015  . Cervical lymphadenopathy 04/20/2015  . CCF (congestive cardiac failure) ([place]) 04/03/2014  . BPH (benign prostatic hyperplasia) 02/10/2014  . Dyslipidemia 02/10/2014  . GERD (gastroesophageal reflux disease) 02/10/2014  . Severe aortic valve stenosis 02/03/2014  . 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 Aortic stenosis, severe 12/19/2013  . Chronic diastolic congestive heart failure ([place])   . Back pain   . Edema 11/20/2013  . Loss of weight 06/26/2013  . Cornea scar 04/09/2013  . Chronic glaucoma 12/17/2012  . Aortic valve stenosis 09/10/2012  . Pseudoaphakia 06/28/2012   . Living will, counseling/discussion 12/28/2011  . 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 Degeneration macular 03/06/2011  . Constipation 01/02/2007  . Hypertensive heart disease with CHF (congestive heart failure) ([place]) 05/19/2003  . CA IN SITU, SKIN NOS 05/19/1999  . Arthropathy of pelvic region and thigh 05/18/1997  . HEMORRHOIDS, INTERNAL, WITH BLEEDING 03/20/1994  . DIVERTICULOSIS, COLON 02/17/1994  . FX OPEN MLT PELVIS W/PELVIC CIRCULAT DISRUPT 03/20/1986  . SCOLIOSIS, THORACIC SPINE 03/20/1944    Past Surgical History:  Procedure Laterality Date  . CARDIAC CATHETERIZATION  10/27/2013  . COLONOSCOPY W/ BIOPSIES  05/2002  . EYE SURGERY     bilateral cataracts  . FLEXIBLE SIGMOIDOSCOPY  07/2005  . INTRAOPERATIVE TRANSESOPHAGEAL ECHOCARDIOGRAM N/A 02/03/2014   Procedure: INTRAOPERATIVE TRANSESOPHAGEAL ECHOCARDIOGRAM;  Surgeon: Sherren Mocha, MD;  Location[facility] OR;  Service: Open Heart Surgery;  Laterality: N/A;  . KNEE ARTHROSCOPY  06/1997   right  . PLEURAL EFFUSION DRAINAGE     2017  . TEE WITHOUT CARDIOVERSION    . TONSILLECTOMY    . TRANSCATHETER AORTIC VALVE REPLACEMENT, TRANSFEMORAL N/A 02/03/2014   Procedure: TRANSCATHETER AORTIC VALVE REPLACEMENT, TRANSFEMORAL;  Surgeon: Sherren Mocha, MD;  Location: [facility];  Service: Open Heart Surgery;  Laterality: N/A;    Past Surgical History:  Procedure Laterality Date  . CARDIAC CATHETERIZATION  10/27/2013  . COLONOSCOPY W/ BIOPSIES  05/2002  . EYE SURGERY     bilateral cataracts  . FLEXIBLE SIGMOIDOSCOPY  07/2005  . INTRAOPERATIVE TRANSESOPHAGEAL ECHOCARDIOGRAM N/A 02/03/2014   Procedure: INTRAOPERATIVE TRANSESOPHAGEAL ECHOCARDIOGRAM;  Surgeon: Sherren Mocha, MD;  Location: [facility];  Service: Open Heart Surgery;  Laterality: N/A;  . KNEE ARTHROSCOPY  06/1997   right  . PLEURAL EFFUSION DRAINAGE     2017  . TEE WITHOUT CARDIOVERSION    . TONSILLECTOMY    . TRANSCATHETER AORTIC VALVE REPLACEMENT, TRANSFEMORAL N/A 02/03/2014    Procedure: TRANSCATHETER AORTIC VALVE REPLACEMENT, TRANSFEMORAL;  Surgeon: Sherren Mocha, MD;  Location: [facility];  Service: Open Heart Surgery;  Laterality: N/A;    Current Outpatient Rx  . Order #: [id number] Class: Normal  . Order #: [id number] Class: Historical Med  . Order #: [id number] Class: Historical Med  . Order #: [id number] Class: Normal  . Order #: [id number] Class: Normal  . Order #: [id number] Class: Normal    Allergies:  Patient has no known allergies.  Family History: Family History  Problem Relation Age of Onset  . Stroke Father   . Diabetes Brother   . Hip fracture Sister     after a fall  . Diabetes Brother   . Coronary artery disease Brother     carotid stenosis  . Hypertension Other   . Colon cancer Neg Hx   . Prostate cancer Neg Hx     Social History: Social History  Substance Use Topics  . Smoking status: Former Smoker    Packs/day: 1.00    Years: 20.00    Types: Cigarettes    Quit date: 01/11/1971  . Smokeless tobacco: Former Systems developer     Comment: quit 40 years ago  . Alcohol use No     Review of Systems:   10 point review of systems was performed and was otherwise negative:Review of systems was acquired through the patient and also family Constitutional: No fever Eyes: No visual disturbances ENT: No sore throat, ear pain Cardiac: No chest pain Respiratory: No shortness of breath, wheezing, or stridor Abdomen: No abdominal pain, no vomiting, No diarrhea Endocrine: No weight loss, No night sweats Extremities: No peripheral edema, cyanosis Skin: No rashes, easy bruising Neurologic: Right-sided upper and lower extremity weakness with slurred speech. No difficulty swallowing swollowing Urologic: No dysuria, Hematuria, or urinary frequency *  Physical Exam:  ED Triage Vitals [02/09/16 1256]  Enc Vitals Group     BP (!) 162/80     Pulse Rate 92     Resp 20     Temp      Temp src      SpO2 98 %     Weight      Height      Head Circumference       Peak Flow      Pain Score      Pain Loc      Pain Edu?      Excl. in [place]?     General: Awake , Alert , and Oriented times 3; GCS 15 Head: Normal cephalic , atraumatic. Slight right-sided facial droop Eyes: Pupils equal , round, reactive to light Nose/Throat: No nasal drainage, patent upper airway without erythema or exudate.  Neck: Supple, Full range of motion, No anterior adenopathy or palpable thyroid masses. No obvious carotid bruits Lungs: Clear to ascultation without wheezes , rhonchi, or rales Heart: Regular rate, regular rhythm grade 3 systolic ejection murmur left sternal border  Abdomen: Soft, non tender without rebound, guarding , or rigidity; bowel sounds positive and symmetric in all 4 quadrants. No organomegaly .        Extremities: 2 plus symmetric pulses. Mild bilateral nonpitting edema, no clubbing or cyanosis  Neurologic: Motor seems to be more weak on the right side of patient being right-handed and also some left lower extremity weakness  Skin: warm, dry, no rashes   Labs:   All laboratory work was reviewed including any pertinent negatives or positives listed below:  Labs Reviewed  PROTIME-INR - Abnormal; Notable for the following:       Result Value   Prothrombin Time 15.7 (*)    All other components within normal limits  CBC - Abnormal; Notable for the following:    RBC 3.88 (*)    Hemoglobin 12.1 (*)    HCT 34.5 (*)    All other components within normal limits  DIFFERENTIAL - Abnormal; Notable for the following:    Lymphs Abs 0.7 (*)    All other components within normal limits  COMPREHENSIVE METABOLIC PANEL - Abnormal; Notable for the following:    Glucose, Bld 101 (*)    BUN 29 (*)    Calcium 8.7 (*)    ALT 16 (*)    All other components within normal limits  TROPONIN I - Abnormal; Notable for the following:    Troponin I 0.05 (*)    All other components within normal limits  APTT  GLUCOSE, CAPILLARY  CBG MONITORING, ED    EKG:  ED ECG  REPORT I, Daymon Larsen, the attending physician, personally viewed and interpreted this ECG.  Date: 02/09/2016 EKG Time: 1309 Rate: 89 Rhythm: normal sinus rhythm QRS Axis: normal Intervals: normal ST/T Wave abnormalities: normal Conduction Disturbances: none Narrative Interpretation: unremarkable  No acute ischemic changes  Radiology:  "Dg Chest 1 View  Result Date: 01/10/2016 CLINICAL DATA:  Post right-sided thoracentesis EXAM: CHEST 1 VIEW COMPARISON:  Chest radiograph - 01/07/2016; chest CT-01/08/2016 FINDINGS: Grossly unchanged cardiac silhouette and mediastinal contours. Post aortic valve replacement. Interval reduction/resolution of right-sided pleural effusion post thoracentesis. No pneumothorax. Slight increase in small to moderate size left-sided effusion. Improved aeration the right lung base. Worsening left basilar heterogeneous/ consolidative opacities, likely atelectasis. Bilateral pleural calcifications, unchanged. No pneumothorax. No definite evidence of edema. No acute osseus abnormalities. Degenerative change of the bilateral glenohumeral joints, left greater than right, incompletely evaluated. IMPRESSION: 1. Interval reduction/resolution of right-sided effusion post thoracentesis. No pneumothorax. 2. Slight increase in small to moderate size left-sided effusion with worsening left basilar opacities, likely atelectasis. 3. Sequela of prior asbestos exposure. Electronically Signed   By: Sandi Mariscal M.D.   On: 01/10/2016 15:23   Dg Chest 2 View  Result Date: 02/08/2016 CLINICAL DATA:  Shortness of breath, history of pneumonia EXAM: CHEST  2 VIEW COMPARISON:  01/11/2016, 01/10/2016, 02/04/2014 FINDINGS: There are small bilateral pleural effusions, unchanged on the right and decreased on the left. Overall improved aeration of the left lung base with residual airspace opacity noted. Stable cardiomediastinal silhouette with stent. Bilateral nodular opacities and calcified  pleural plaques are grossly unchanged. Atherosclerosis. No pneumothorax. IMPRESSION: 1. Small right-sided pleural effusion, unchanged. Small left-sided pleural effusion, decreased compared to prior. 2. Mild residual left basilar opacity, atelectasis versus infiltrate. 3. Otherwise no significant interval changes. Electronically Signed   By: Donavan Foil M.D.   On: 02/08/2016 22:49   Dg Chest 2 View  Result Date: 01/11/2016 CLINICAL DATA:  Pneumonia. EXAM: CHEST  2 VIEW COMPARISON:  01/10/2016 FINDINGS: Prior aortic valve repair. Cardiomegaly. Moderate left pleural effusion, slightly decreased since prior study with left lower lobe atelectasis or infiltrate. Small right pleural effusions suspected. Calcified pleural plaques noted bilaterally. IMPRESSION: Moderate left pleural  effusion and small right pleural effusion. The left pleural effusion has decreased since prior study. Left lower lobe atelectasis or infiltrate. Cardiomegaly. Calcified pleural plaques. Electronically Signed   By: Rolm Baptise M.D.   On: 01/11/2016 15:09   Ct Head Code Stroke W/o Cm  Result Date: 02/09/2016 CLINICAL DATA:  Code stroke. 82 year old male with confusion right side weakness facial droop and slurred speech. Initial encounter. EXAM: CT HEAD WITHOUT CONTRAST TECHNIQUE: Contiguous axial images were obtained from the base of the skull through the vertex without intravenous contrast. COMPARISON:  None. FINDINGS: Brain: Positive for cytotoxic edema in the left MCA territory at the frontal operculum and involving a small portion of the anterior left insula. No associated hemorrhage or mass effect. Lentiform nuclei and internal capsule appear spared. Posterior insula and operculum are spared. No temporal lobe involvement is evident. No superior left MCA territory involvement. Normal gray-white matter differentiation in the right hemisphere and posterior fossa. No ventriculomegaly. Patent basilar cisterns. No intracranial mass  effect. No acute intracranial hemorrhage identified. Vascular: Calcified atherosclerosis at the skull base. Positive for abnormal hyperdensity at the distal left MCA M1 segment and left MCA bifurcation (series 6, image 43). Skull: No acute osseous abnormality identified. Sinuses/Orbits: Clear. Other: No acute orbit or scalp soft tissue findings. ASPECTS [facility] Stroke Program Early CT Score) - Ganglionic level infarction (caudate, lentiform nuclei, internal capsule, insula, M1-M3 cortex): 5 (minus 2 for the insula and M1 area). - Supraganglionic infarction (M4-M6 cortex): 3 Total score (0-10 with 10 being normal): 8 IMPRESSION: 1. Positive for a left MCA infarct with operculum cytotoxic edema, no associated hemorrhage. Positive hyperdense distal Left M1 suggesting Emergent Large Vessel Occlusion. 2. ASPECTS is 8. 3. Study discussed by telephone with Dr. Meade Maw on 02/09/2016 at 13:13 . Electronically Signed   By: Genevie Ann M.D.   On: 02/09/2016 13:14   US Thoracentesis Asp Pleural Space W/img Guide  Result Date: 01/10/2016 INDICATION: Symptomatic right sided pleural effusion - please perform ultrasound-guided thoracentesis for diagnostic and therapeutic purposes EXAM: US THORACENTESIS ASP PLEURAL SPACE W/IMG GUIDE COMPARISON:  Chest radiograph - 01/07/2016; chest CT - 01/08/2016 MEDICATIONS: None. COMPLICATIONS: None immediate. TECHNIQUE: Informed written consent was obtained from the patient after a discussion of the risks, benefits and alternatives to treatment. A timeout was performed prior to the initiation of the procedure. Initial ultrasound scanning demonstrates a large anechoic right-sided pleural effusion. The lower chest was prepped and draped in the usual sterile fashion. 1% lidocaine was used for local anesthesia. An ultrasound image was saved for documentation purposes. An 8 Fr Safe-T-Centesis catheter was introduced. The thoracentesis was performed. The catheter was removed and a dressing was  applied. The patient tolerated the procedure well without immediate post procedural complication. The patient was escorted to have an upright chest radiograph. FINDINGS: A total of approximately 1.1 liters of serous fluid was removed. Requested samples were sent to the laboratory. IMPRESSION: Successful ultrasound-guided right sided thoracentesis yielding 1.1 liters of pleural fluid. Electronically Signed   By: Sandi Mariscal M.D.   On: 01/10/2016 15:24  "  I personally reviewed the radiologic studies  Critical Care:  CRITICAL CARE Performed by: Daymon Larsen   Total critical care time: 31 minutes  Critical care time was exclusive of separately billable procedures and treating other patients.  Critical care was necessary to treat or prevent imminent or life-threatening deterioration.  Critical care was time spent personally by me on the following activities: development of treatment plan with  patient and/or surrogate as well as nursing, discussions with consultants, evaluation of patient's response to treatment, examination of patient, obtaining history from patient or surrogate, ordering and performing treatments and interventions, ordering and review of laboratory studies, ordering and review of radiographic studies, pulse oximetry and re-evaluation of patient's condition. Acute management of a cerebral vascular accident   ED Course: Patient had a head CT which didn't show evidence of a MCA stroke. The patient seems to be slightly improving over time and due to initiation and timing of his symptoms is not a TPA candidate. Patient was seen by the specialist on call who states no TPA at this time and no other recommended evaluation or treatment other than oral aspirin. Clinical Course      Assessment: * Acute cerebrovascular accident   Final Clinical Impression:   Final diagnoses:  Acute ischemic stroke [facility])     Plan:  Inpatient          Daymon Larsen, MD 02/09/16  [phone]

## 2016-02-09 NOTE — ED Notes (Signed)
Admitting MD at bedside.

## 2016-02-09 NOTE — ED Notes (Signed)
Spoke with charge RN on 1C in regards to patient receiving a bed.  She is getting in touch with bed control to work on bed placement.  Patient and family updated on delay and are comfortable at this time.

## 2016-02-09 NOTE — Progress Notes (Signed)
Family Meeting Note  Advance Directive:yes  Today a meeting took place with the Patient, son  Patient is able to participate in the meeting The following clinical team members were present during this meeting:MD  The following were discussed:Patient's diagnosis: , Patient's progosis: Unable to determine and Goals for treatment: Full Code, son is the healthcare power of attorney not considering palliative care at this time  Additional follow-up to be provided: Hospitalist and a neurologist  Time spent during discussion:16 min  Douglas Parker, Illene Silver, MD

## 2016-02-09 NOTE — ED Triage Notes (Signed)
Pt with ride sided weakness and facial droop with slurred speech  Started at [place]. Wife called ahead making aware of pt arrival. Pt had to be removed by 2 staff members from his car.

## 2016-02-09 NOTE — Consult Note (Signed)
Pharmacy Antibiotic Note  Douglas Parker Sr. is a 82 y.o. male admitted on 02/09/2016 with CVA/possible PNA.  Pharmacy has been consulted for levofloxacin dosing.  Plan: levofloxacin 750mg  q 24 hours  Weight: 146 lb 11.2 oz (66.5 kg)  Temp (24hrs), Avg:97.9 F (36.6 C), Min:97.9 F (36.6 C), Max:97.9 F (36.6 C)   Recent Labs Lab 02/08/16 1327 02/09/16 1305  WBC 8.3 7.6  CREATININE 1.04 0.98    Estimated Creatinine Clearance: 50.9 mL/min (by C-G formula based on SCr of 0.98 mg/dL).    No Known Allergies  Antimicrobials this admission: levofloxacin 11/22 >>    Dose adjustments this admission:   Microbiology results:  Chest x-ray 1. Small right-sided pleural effusion, unchanged. Small left-sided pleural effusion, decreased compared to prior. 2. Mild residual left basilar opacity, atelectasis versus infiltrate. 3. Otherwise no significant interval changes.  Thank you for allowing pharmacy to be a part of this patient's care.  Ramond Dial 02/09/2016 6:47 PM

## 2016-02-10 ENCOUNTER — Inpatient Hospital Stay: Payer: Medicare Other

## 2016-02-10 LAB — LIPID PANEL
CHOLESTEROL: 131 mg/dL (ref 0–200)
HDL: 49 mg/dL (ref >40)
LDL Cholesterol: 72 mg/dL (ref 0–99)
TRIGLYCERIDES: 52 mg/dL (ref <150)
Total CHOL/HDL Ratio: 2.7 RATIO
VLDL: 10 mg/dL (ref 0–40)

## 2016-02-10 LAB — TSH: TSH: 0.873 u[IU]/mL (ref 0.350–4.500)

## 2016-02-10 LAB — TROPONIN I
Troponin I: 0.04 ng/mL (ref <0.03)
Troponin I: 0.05 ng/mL (ref <0.03)

## 2016-02-10 MED ORDER — HYDRALAZINE HCL 20 MG/ML IJ SOLN: 10.0000 mg | Freq: Four times a day (QID) | INTRAMUSCULAR | Status: DC | PRN

## 2016-02-10 MED ORDER — LEVOFLOXACIN 750 MG PO TABS: 750.0000 mg | ORAL_TABLET | Freq: Every day | ORAL | Status: DC

## 2016-02-10 MED ORDER — LEVOFLOXACIN IN D5W 750 MG/150ML IV SOLN
750.0000 mg | Freq: Every day | INTRAVENOUS | Status: DC
  Administered 2016-02-10 – 2016-02-11 (×2): 750 mg via INTRAVENOUS
  Filled 2016-02-10 (×2): qty 150

## 2016-02-10 NOTE — Progress Notes (Signed)
[place] [facility]                                                                                                                                                                                  Patient Demographics   Douglas Parker, is a 82 y.o. male, DOB - 1927/07/20, [id number]  Admit date - 02/09/2016   Admitting Physician Nicholes Mango, MD  Outpatient Primary MD for the patient is Elsie Stain, MD   LOS - 1  Subjective: Patient admitted with trouble speaking and right-sided weakness. He is noted to have a left MCA hyperdense CVA Currently not able to communicate and not responding    Review of Systems:   CONSTITUTIONAL: Unable to provide due to patient being poorly responsive  Vitals:   Vitals:   02/10/16 0202 02/10/16 0419 02/10/16 0559 02/10/16 0858  BP: (!) 167/71 (!) 177/67 (!) 163/71 (!) 158/53  Pulse: 76 74 68 74  Resp: 19 20 20 20   Temp: 98.7 F (37.1 C) 98.2 F (36.8 C) 98.1 F (36.7 C) 98.8 F (37.1 C)  TempSrc: Oral Oral Oral Oral  SpO2: 97% 93% 94% 95%  Weight:      Height:        Wt Readings from Last 3 Encounters:  02/09/16 149 lb (67.6 kg)  02/08/16 148 lb 8 oz (67.4 kg)  01/28/16 147 lb (66.7 kg)     Intake/Output Summary (Last 24 hours) at 02/10/16 1151 Last data filed at 02/10/16 0315  Gross per 24 hour  Intake                0 ml  Output              200 ml  Net             -200 ml    Physical Exam:   GENERAL:Appears chronically ill HEAD, EYES, EARS, NOSE AND THROAT: Atraumatic, normocephalic.  Pupils equal and reactive to light. Sclerae anicteric. No conjunctival injection. No oro-pharyngeal erythema.  NECK: Supple. There is no jugular venous distention. No bruits, no lymphadenopathy, no thyromegaly.  HEART: Regular rate and rhythm,. No murmurs, no rubs, no clicks.  LUNGS: Rhonchus breath sounds bilaterally without any rales or wheezing ABDOMEN: Soft, flat, nontender, nondistended. Has good  bowel sounds. No hepatosplenomegaly appreciated.  EXTREMITIES: No evidence of any cyanosis, clubbing, or peripheral edema.  +2 pedal and radial pulses bilaterally.  NEUROLOGIC: Limited unable to follow any commands  SKIN: Moist and warm with no rashes appreciated.  Psych: Not anxious, depressed poorly responsive LN: No inguinal LN enlargement    Antibiotics   Anti-infectives    Start  Dose/Rate Route Frequency Ordered Stop   02/10/16 2000  levofloxacin (LEVAQUIN) IVPB 750 mg  Status:  Discontinued     750 mg 100 mL/hr over 90 Minutes Intravenous Daily-1800 02/09/16 1846 02/10/16 1101   02/10/16 1200  levofloxacin (LEVAQUIN) tablet 750 mg  Status:  Discontinued     750 mg Oral Daily 02/10/16 1101 02/10/16 1112   02/10/16 1200  levofloxacin (LEVAQUIN) IVPB 750 mg     750 mg 100 mL/hr over 90 Minutes Intravenous Daily 02/10/16 1112        Medications   Scheduled Meds: .  stroke: mapping our early stages of recovery book   Does not apply Once  . aspirin EC  325 mg Oral Daily  . enoxaparin (LOVENOX) injection  40 mg Subcutaneous Q24H  . levofloxacin (LEVAQUIN) IV  750 mg Intravenous Daily   Continuous Infusions: PRN Meds:.   Data Review:   Micro Results No results found for this or any previous visit (from the past 240 hour(s)).  Radiology Reports Dg Chest 2 View  Result Date: 02/09/2016 CLINICAL DATA:  Right-sided weakness and facial droop EXAM: CHEST  2 VIEW COMPARISON:  02/08/2016 FINDINGS: Valvular prosthesis again visualized. Bilateral calcified pleural plaques. Bilateral calcified nodules unchanged. No change in small bilateral pleural effusions. Stable mild cardiomegaly with mild central congestion. No pneumothorax. IMPRESSION: 1. No significant interval change in small bilateral pleural effusions and mild left basilar opacity. 2. Stable mild cardiomegaly.  There is mild central congestion. 3. Calcified pleural plaques and small calcified lung nodules as before.  Electronically Signed   By: Donavan Foil M.D.   On: 02/09/2016 20:44   Dg Chest 2 View  Result Date: 02/08/2016 CLINICAL DATA:  Shortness of breath, history of pneumonia EXAM: CHEST  2 VIEW COMPARISON:  01/11/2016, 01/10/2016, 02/04/2014 FINDINGS: There are small bilateral pleural effusions, unchanged on the right and decreased on the left. Overall improved aeration of the left lung base with residual airspace opacity noted. Stable cardiomediastinal silhouette with stent. Bilateral nodular opacities and calcified pleural plaques are grossly unchanged. Atherosclerosis. No pneumothorax. IMPRESSION: 1. Small right-sided pleural effusion, unchanged. Small left-sided pleural effusion, decreased compared to prior. 2. Mild residual left basilar opacity, atelectasis versus infiltrate. 3. Otherwise no significant interval changes. Electronically Signed   By: Donavan Foil M.D.   On: 02/08/2016 22:49   Dg Chest 2 View  Result Date: 01/11/2016 CLINICAL DATA:  Pneumonia. EXAM: CHEST  2 VIEW COMPARISON:  01/10/2016 FINDINGS: Prior aortic valve repair. Cardiomegaly. Moderate left pleural effusion, slightly decreased since prior study with left lower lobe atelectasis or infiltrate. Small right pleural effusions suspected. Calcified pleural plaques noted bilaterally. IMPRESSION: Moderate left pleural effusion and small right pleural effusion. The left pleural effusion has decreased since prior study. Left lower lobe atelectasis or infiltrate. Cardiomegaly. Calcified pleural plaques. Electronically Signed   By: Rolm Baptise M.D.   On: 01/11/2016 15:09   US Carotid Bilateral (at [facility] Only)  Result Date: 02/10/2016 CLINICAL DATA:  CVA.  History of hypertension and hyperlipidemia. EXAM: BILATERAL CAROTID DUPLEX ULTRASOUND TECHNIQUE: Pearline Cables scale imaging, color Doppler and duplex ultrasound were performed of bilateral carotid and vertebral arteries in the neck. COMPARISON:  None. FINDINGS: Criteria: Quantification  of carotid stenosis is based on velocity parameters that correlate the residual internal carotid diameter with NASCET-based stenosis levels, using the diameter of the distal internal carotid lumen as the denominator for stenosis measurement. The following velocity measurements were obtained: RIGHT ICA:  101/21 cm/sec CCA:  [id number] cm/sec SYSTOLIC ICA/CCA RATIO:  1.1 DIASTOLIC ICA/CCA RATIO:  1.6 ECA:  125 cm/sec LEFT ICA:  113/23 cm/sec CCA:  [id number] cm/sec SYSTOLIC ICA/CCA RATIO:  1.2 DIASTOLIC ICA/CCA RATIO:  1.6 ECA:  125 cm/sec RIGHT CAROTID ARTERY: There is a minimal amount of eccentric mixed echogenic plaque within the right carotid bulb (images 12 and 13), extending to involve the origin proximal aspect the right internal carotid artery (image 20), not resulting in elevated peak systolic velocities within the interrogated course the right internal carotid artery to suggest a hemodynamically significant stenosis. RIGHT VERTEBRAL ARTERY:  Antegrade Flow LEFT CAROTID ARTERY: There is a minimal amount of focal eccentric echogenic plaque involving the proximal (images 31 and 32) and mid (images 35 and 36) aspects of the left common carotid artery. There is a minimal to moderate amount of eccentric mixed echogenic plaque within the left carotid bulb (images 44 and 45). There is a moderate to large amount of eccentric mixed echogenic plaque involving the origin and proximal aspects of the left internal carotid artery (image 53), not resulting in elevated peak systolic velocities within the interrogated course of the left internal carotid artery to suggest a hemodynamically significant stenosis. LEFT VERTEBRAL ARTERY:  Antegrade Flow IMPRESSION: 1. Moderate to large amount of left-sided atherosclerotic plaque, not resulting in hemodynamically significant stenosis. 2. Minimal amount of right-sided atherosclerotic plaque, not resulting in a hemodynamically significant stenosis. Electronically Signed   By: Sandi Mariscal M.D.    On: 02/10/2016 10:37   Ct Head Code Stroke W/o Cm  Result Date: 02/09/2016 CLINICAL DATA:  Code stroke. 82 year old male with confusion right side weakness facial droop and slurred speech. Initial encounter. EXAM: CT HEAD WITHOUT CONTRAST TECHNIQUE: Contiguous axial images were obtained from the base of the skull through the vertex without intravenous contrast. COMPARISON:  None. FINDINGS: Brain: Positive for cytotoxic edema in the left MCA territory at the frontal operculum and involving a small portion of the anterior left insula. No associated hemorrhage or mass effect. Lentiform nuclei and internal capsule appear spared. Posterior insula and operculum are spared. No temporal lobe involvement is evident. No superior left MCA territory involvement. Normal gray-white matter differentiation in the right hemisphere and posterior fossa. No ventriculomegaly. Patent basilar cisterns. No intracranial mass effect. No acute intracranial hemorrhage identified. Vascular: Calcified atherosclerosis at the skull base. Positive for abnormal hyperdensity at the distal left MCA M1 segment and left MCA bifurcation (series 6, image 43). Skull: No acute osseous abnormality identified. Sinuses/Orbits: Clear. Other: No acute orbit or scalp soft tissue findings. ASPECTS [facility] Stroke Program Early CT Score) - Ganglionic level infarction (caudate, lentiform nuclei, internal capsule, insula, M1-M3 cortex): 5 (minus 2 for the insula and M1 area). - Supraganglionic infarction (M4-M6 cortex): 3 Total score (0-10 with 10 being normal): 8 IMPRESSION: 1. Positive for a left MCA infarct with operculum cytotoxic edema, no associated hemorrhage. Positive hyperdense distal Left M1 suggesting Emergent Large Vessel Occlusion. 2. ASPECTS is 8. 3. Study discussed by telephone with Dr. Meade Maw on 02/09/2016 at 13:13 . Electronically Signed   By: Genevie Ann M.D.   On: 02/09/2016 13:14     CBC  Recent Labs Lab 02/08/16 1327  02/09/16 1305  WBC 8.3 7.6  HGB 11.6* 12.1*  HCT 34.5* 34.5*  PLT 187.0 175  MCV 89.7 88.9  MCH  --  31.1  MCHC 33.7 35.0  RDW 14.5 14.3  LYMPHSABS 0.9 0.7*  MONOABS 0.5 0.6  EOSABS 0.0 0.0  BASOSABS 0.0  0.0    Chemistries   Recent Labs Lab 02/08/16 1327 02/09/16 1305  NA 145 143  K 4.2 3.8  CL 108 110  CO2 31 27  GLUCOSE 124* 101*  BUN 29* 29*  CREATININE 1.04 0.98  CALCIUM 9.1 8.7*  AST 11 22  ALT 14 16*  ALKPHOS 65 64  BILITOT 1.0 1.2   ------------------------------------------------------------------------------------------------------------------ estimated creatinine clearance is 51.7 mL/min (by C-G formula based on SCr of 0.98 mg/dL). ------------------------------------------------------------------------------------------------------------------ No results for input(s): HGBA1C in the last 72 hours. ------------------------------------------------------------------------------------------------------------------  Recent Labs  02/10/16 0522  CHOL 131  HDL 49  LDLCALC 72  TRIG 52  CHOLHDL 2.7   ------------------------------------------------------------------------------------------------------------------  Recent Labs  02/10/16 0522  TSH 0.873   ------------------------------------------------------------------------------------------------------------------ No results for input(s): VITAMINB12, FOLATE, FERRITIN, TIBC, IRON, RETICCTPCT in the last 72 hours.  Coagulation profile  Recent Labs Lab 02/09/16 1305  INR 1.24    No results for input(s): DDIMER in the last 72 hours.  Cardiac Enzymes  Recent Labs Lab 02/09/16 1700 02/09/16 2319 02/10/16 0522  TROPONINI 0.05* 0.04* 0.05*   ------------------------------------------------------------------------------------------------------------------ Invalid input(s): POCBNP    Assessment & Plan   Douglas Parker  is a 82 y.o. male with a known history of Congestive heart failure,  hypertension, hyperlipidemia and multiple other medical problems is brought into the ED for right-sided weakness and facial droop with slurry speech. According to the family members patient's symptoms started at around 4 -6 am patient is brought into the ED CT head has revealed left MCA infarct  #Acute CVA-left MCA infarct on CT head Patient with poor responsiveness. Appreciate neurology input Await MRI of the brain CTA of the carotids Continue antiplatelets therapy    #Pneumonia Continue therapy with Levaquin   #. Essential hypertension Unable to swallow will place her on IV hydralazine as needed   #Hyperlipidemia High intensity statin will be started once able to swallow  #Benign prostatic hypertrophy Proscar held due to patient unable to swallow  All the records are reviewed and case discussed with ED provider.   CODE STATUS: Full code prognosis very poor     Code Status Orders        Start     Ordered   02/09/16 1826  Full code  Continuous     02/09/16 1825    Code Status History    Date Active Date Inactive Code Status Order ID Comments User Context   01/05/2016 10:39 PM 01/12/2016  9:25 PM Full Code [id number]  Demetrios Loll, MD Inpatient   02/03/2014  2:18 PM 02/06/2014  5:53 PM Full Code [id number]  Sherren Mocha, MD Inpatient           Consults  neurology  DVT Prophylaxis  Lovenox    Lab Results  Component Value Date   PLT 175 02/09/2016     Time Spent in minutes   64min  Greater than 50% of time spent in care coordination and counseling patient regarding the condition and plan of care.   Dustin Flock M.D on 02/10/2016 at 11:51 AM  Between 7am to 6pm - Pager - [phone]  After 6pm go to [email or web address] - password [email or web address] [place] [facility]   Office  [phone]

## 2016-02-10 NOTE — Progress Notes (Addendum)
PHARMACIST - PHYSICIAN COMMUNICATION DR:   Posey Pronto CONCERNING: Antibiotic IV to Oral Route Change Policy  RECOMMENDATION: This patient is receiving levofloxacin by the intravenous route.  Based on criteria approved by the Pharmacy and Therapeutics Committee, the antibiotic(s) is/are being converted to the equivalent oral dose form(s).   DESCRIPTION: These criteria include:  Patient being treated for a respiratory tract infection, urinary tract infection, cellulitis or clostridium difficile associated diarrhea if on metronidazole  The patient is not neutropenic and does not exhibit a GI malabsorption state  The patient is eating (either orally or via tube) and/or has been taking other orally administered medications for a least 24 hours  The patient is improving clinically and has a Tmax < 100.5  If you have questions about this conversion, please contact the [place], PharmD, BCPS 02/10/16 11:02 AM  Addendum: Changed back to IV per RN reporting patient unable to talk PO at this time.   Lenis Noon

## 2016-02-10 NOTE — Plan of Care (Signed)
Problem: Education: Goal: Knowledge of [place] [facility] Education information/materials will improve Outcome: Progressing Per family  Problem: Health Behavior/Discharge Planning: Goal: Ability to manage health-related needs will improve Outcome: Progressing Per family  Problem: Education: Goal: Knowledge of disease or condition will improve Outcome: Progressing Per family

## 2016-02-10 NOTE — Consult Note (Signed)
Reason for Consult:R sided weakness  Referring Physician: Dr. Margaretmary Eddy  CC: R sided weakness  HPI: Douglas Ulin Orr Sr. is an 82 y.o. male with history of CHF, hypertension, hyperlipidemia brought  into the ED for right-sided weakness and facial droop with slurry speech. Pt seemed normal night prior   Past Medical History:  Diagnosis Date  . Aortic valve stenosis 09/10/2012  . Arthritis   . Back pain   . Benign prostatic hypertrophy 10/18/00  . Cancer ([place])    skin cancers  . CHF (congestive heart failure) ([place]) 04/21/03  . Chronic diastolic congestive heart failure ([place])   . Colon polyps 06/05/2002   path could not be found   . Diverticulosis of colon 06/05/2002  . Hyperlipidemia 09/05/95  . Hypertension 05/19/03  . 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 Scoliosis   . Thinning of skin    bruises easy.    Past Surgical History:  Procedure Laterality Date  . CARDIAC CATHETERIZATION  10/27/2013  . COLONOSCOPY W/ BIOPSIES  05/2002  . EYE SURGERY     bilateral cataracts  . FLEXIBLE SIGMOIDOSCOPY  07/2005  . INTRAOPERATIVE TRANSESOPHAGEAL ECHOCARDIOGRAM N/A 02/03/2014   Procedure: INTRAOPERATIVE TRANSESOPHAGEAL ECHOCARDIOGRAM;  Surgeon: Sherren Mocha, MD;  Location: [facility] OR;  Service: Open Heart Surgery;  Laterality: N/A;  . KNEE ARTHROSCOPY  06/1997   right  . PLEURAL EFFUSION DRAINAGE     2017  . TEE WITHOUT CARDIOVERSION    . TONSILLECTOMY    . TRANSCATHETER AORTIC VALVE REPLACEMENT, TRANSFEMORAL N/A 02/03/2014   Procedure: TRANSCATHETER AORTIC VALVE REPLACEMENT, TRANSFEMORAL;  Surgeon: Sherren Mocha, MD;  Location: [facility];  Service: Open Heart Surgery;  Laterality: N/A;    Family History  Problem Relation Age of Onset  . Stroke Father   . Diabetes Brother   . Hip fracture Sister     after a fall  . Diabetes Brother   . Coronary artery disease Brother      carotid stenosis  . Hypertension Other   . Colon cancer Neg Hx   . Prostate cancer Neg Hx     Social History:  reports that he quit smoking about 45 years ago. His smoking use included Cigarettes. He has a 20.00 pack-year smoking history. He has quit using smokeless tobacco. He reports that he does not drink alcohol or use drugs.  No Known Allergies  Medications: I have reviewed the patient's current medications.  ROS: History obtained from the patient  General ROS: negative for - chills, fatigue, fever, night sweats, weight gain or weight loss Psychological ROS: negative for - behavioral disorder, hallucinations, memory difficulties, mood swings or suicidal ideation Ophthalmic ROS: negative for - blurry vision, double vision, eye pain or loss of vision ENT ROS: negative for - epistaxis, nasal discharge, oral lesions, sore throat, tinnitus or vertigo Allergy and Immunology ROS: negative for - hives or itchy/watery eyes Hematological and Lymphatic ROS: negative for - bleeding problems, bruising or swollen lymph nodes Endocrine ROS: negative for - galactorrhea, hair pattern changes, polydipsia/polyuria or temperature intolerance Respiratory ROS: negative for - cough, hemoptysis, shortness of breath or wheezing Cardiovascular ROS: negative for - chest pain, dyspnea on exertion, edema or irregular heartbeat Gastrointestinal ROS: negative for - abdominal pain, diarrhea, hematemesis, nausea/vomiting or stool incontinence Genito-Urinary ROS: negative for - dysuria, hematuria, incontinence or urinary frequency/urgency Musculoskeletal ROS: negative for - joint swelling or muscular weakness Neurological  ROS: as noted in HPI Dermatological ROS: negative for rash and skin lesion changes  Physical Examination: Blood pressure (!) 158/53, pulse 74, temperature 98.8 F (37.1 C), temperature source Oral, resp. rate 20, height 5\' 9"  (1.753 m), weight 67.6 kg (149 lb), SpO2 95 %.    Neurological  Examination Mental Status: Confusion. Tells me place but not time and date Cranial Nerves: II: Discs flat bilaterally; Visual fields grossly normal, pupils equal, round, reactive to light and accommodation III,IV, VI: ptosis not present, extra-ocular motions intact bilaterally V,VII: R facial droop  VIII: hearing normal bilaterally IX,X: gag reflex present XI: bilateral shoulder shrug XII: midline tongue extension Motor: Right : Upper extremity   4+/5    Left:     Upper extremity   5/5  Lower extremity   4/5     Lower extremity   5/5 Tone and bulk:normal tone throughout; no atrophy noted Sensory: Pinprick and light touch intact throughout, bilaterally Deep Tendon Reflexes: 1+ and symmetric throughout Plantars: Right: downgoing   Left: downgoing Cerebellar: normal finger-to-nose, normal rapid alternating movements and normal heel-to-shin test Gait: not tested.       Laboratory Studies:   Basic Metabolic Panel:  Recent Labs Lab 02/08/16 1327 02/09/16 1305  NA 145 143  K 4.2 3.8  CL 108 110  CO2 31 27  GLUCOSE 124* 101*  BUN 29* 29*  CREATININE 1.04 0.98  CALCIUM 9.1 8.7*    Liver Function Tests:  Recent Labs Lab 02/08/16 1327 02/09/16 1305  AST 11 22  ALT 14 16*  ALKPHOS 65 64  BILITOT 1.0 1.2  PROT 6.3 6.5  ALBUMIN 3.7 3.7   No results for input(s): LIPASE, AMYLASE in the last 168 hours. No results for input(s): AMMONIA in the last 168 hours.  CBC:  Recent Labs Lab 02/08/16 1327 02/09/16 1305  WBC 8.3 7.6  NEUTROABS 6.8 6.2  HGB 11.6* 12.1*  HCT 34.5* 34.5*  MCV 89.7 88.9  PLT 187.0 175    Cardiac Enzymes:  Recent Labs Lab 02/09/16 1305 02/09/16 1700 02/09/16 2319 02/10/16 0522  TROPONINI 0.05* 0.05* 0.04* 0.05*    BNP: Invalid input(s): POCBNP  CBG:  Recent Labs Lab 02/09/16 1310  GLUCAP 89    Microbiology: Results for orders placed or performed during the hospital encounter of 01/05/16  Blood culture (routine x 2)      Status: None   Collection Time: 01/05/16  5:35 PM  Result Value Ref Range Status   Specimen Description BLOOD LEFT WRIST  Final   Special Requests BOTTLES DRAWN AEROBIC AND ANAEROBIC AER 5CC 5CC  Final   Culture NO GROWTH 5 DAYS  Final   Report Status 01/10/2016 FINAL  Final  Blood culture (routine x 2)     Status: None   Collection Time: 01/05/16  5:35 PM  Result Value Ref Range Status   Specimen Description BLOOD RIGHT AC  Final   Special Requests BOTTLES DRAWN AEROBIC AND ANAEROBIC AER4CC ANA 3CC  Final   Culture NO GROWTH 5 DAYS  Final   Report Status 01/10/2016 FINAL  Final  Culture, sputum-assessment     Status: None   Collection Time: 01/06/16  9:45 AM  Result Value Ref Range Status   Specimen Description EXPECTORATED SPUTUM  Final   Special Requests NONE  Final   Sputum evaluation   Final    Sputum specimen not acceptable for testing.  Please recollect.   SPOKE WITH MELANIE [facility] AT [id number] 01/06/16 SDR  Report Status 01/06/2016 FINAL  Final  Body fluid culture     Status: None   Collection Time: 01/10/16  2:28 PM  Result Value Ref Range Status   Specimen Description FLUID PLEURAL  Final   Special Requests NONE  Final   Gram Stain NO WBC SEEN NO ORGANISMS SEEN   Final   Culture   Final    NO GROWTH 3 DAYS Performed at [facility]    Report Status 01/14/2016 FINAL  Final    Coagulation Studies:  Recent Labs  02/09/16 1305  LABPROT 15.7*  INR 1.24    Urinalysis: No results for input(s): COLORURINE, LABSPEC, PHURINE, GLUCOSEU, HGBUR, BILIRUBINUR, KETONESUR, PROTEINUR, UROBILINOGEN, NITRITE, LEUKOCYTESUR in the last 168 hours.  Invalid input(s): APPERANCEUR  Lipid Panel:     Component Value Date/Time   CHOL 131 02/10/2016 0522   TRIG 52 02/10/2016 0522   HDL 49 02/10/2016 0522   CHOLHDL 2.7 02/10/2016 0522   VLDL 10 02/10/2016 0522   LDLCALC 72 02/10/2016 0522    HgbA1C:  Lab Results  Component Value Date   HGBA1C 5.5 01/30/2014     Urine Drug Screen:  No results found for: LABOPIA, COCAINSCRNUR, LABBENZ, AMPHETMU, THCU, LABBARB  Alcohol Level: No results for input(s): ETH in the last 168 hours.  Other results:  Dg Chest 2 View  Result Date: 02/09/2016 CLINICAL DATA:  Right-sided weakness and facial droop EXAM: CHEST  2 VIEW COMPARISON:  02/08/2016 FINDINGS: Valvular prosthesis again visualized. Bilateral calcified pleural plaques. Bilateral calcified nodules unchanged. No change in small bilateral pleural effusions. Stable mild cardiomegaly with mild central congestion. No pneumothorax. IMPRESSION: 1. No significant interval change in small bilateral pleural effusions and mild left basilar opacity. 2. Stable mild cardiomegaly.  There is mild central congestion. 3. Calcified pleural plaques and small calcified lung nodules as before. Electronically Signed   By: Donavan Foil M.D.   On: 02/09/2016 20:44   Dg Chest 2 View  Result Date: 02/08/2016 CLINICAL DATA:  Shortness of breath, history of pneumonia EXAM: CHEST  2 VIEW COMPARISON:  01/11/2016, 01/10/2016, 02/04/2014 FINDINGS: There are small bilateral pleural effusions, unchanged on the right and decreased on the left. Overall improved aeration of the left lung base with residual airspace opacity noted. Stable cardiomediastinal silhouette with stent. Bilateral nodular opacities and calcified pleural plaques are grossly unchanged. Atherosclerosis. No pneumothorax. IMPRESSION: 1. Small right-sided pleural effusion, unchanged. Small left-sided pleural effusion, decreased compared to prior. 2. Mild residual left basilar opacity, atelectasis versus infiltrate. 3. Otherwise no significant interval changes. Electronically Signed   By: Donavan Foil M.D.   On: 02/08/2016 22:49   US Carotid Bilateral (at [facility] Only)  Result Date: 02/10/2016 CLINICAL DATA:  CVA.  History of hypertension and hyperlipidemia. EXAM: BILATERAL CAROTID DUPLEX ULTRASOUND TECHNIQUE: Pearline Cables scale  imaging, color Doppler and duplex ultrasound were performed of bilateral carotid and vertebral arteries in the neck. COMPARISON:  None. FINDINGS: Criteria: Quantification of carotid stenosis is based on velocity parameters that correlate the residual internal carotid diameter with NASCET-based stenosis levels, using the diameter of the distal internal carotid lumen as the denominator for stenosis measurement. The following velocity measurements were obtained: RIGHT ICA:  101/21 cm/sec CCA:  [id number] cm/sec SYSTOLIC ICA/CCA RATIO:  1.1 DIASTOLIC ICA/CCA RATIO:  1.6 ECA:  125 cm/sec LEFT ICA:  113/23 cm/sec CCA:  [id number] cm/sec SYSTOLIC ICA/CCA RATIO:  1.2 DIASTOLIC ICA/CCA RATIO:  1.6 ECA:  125 cm/sec RIGHT CAROTID ARTERY: There is a minimal  amount of eccentric mixed echogenic plaque within the right carotid bulb (images 12 and 13), extending to involve the origin proximal aspect the right internal carotid artery (image 20), not resulting in elevated peak systolic velocities within the interrogated course the right internal carotid artery to suggest a hemodynamically significant stenosis. RIGHT VERTEBRAL ARTERY:  Antegrade Flow LEFT CAROTID ARTERY: There is a minimal amount of focal eccentric echogenic plaque involving the proximal (images 31 and 32) and mid (images 35 and 36) aspects of the left common carotid artery. There is a minimal to moderate amount of eccentric mixed echogenic plaque within the left carotid bulb (images 44 and 45). There is a moderate to large amount of eccentric mixed echogenic plaque involving the origin and proximal aspects of the left internal carotid artery (image 53), not resulting in elevated peak systolic velocities within the interrogated course of the left internal carotid artery to suggest a hemodynamically significant stenosis. LEFT VERTEBRAL ARTERY:  Antegrade Flow IMPRESSION: 1. Moderate to large amount of left-sided atherosclerotic plaque, not resulting in hemodynamically  significant stenosis. 2. Minimal amount of right-sided atherosclerotic plaque, not resulting in a hemodynamically significant stenosis. Electronically Signed   By: Sandi Mariscal M.D.   On: 02/10/2016 10:37   Ct Head Code Stroke W/o Cm  Result Date: 02/09/2016 CLINICAL DATA:  Code stroke. 82 year old male with confusion right side weakness facial droop and slurred speech. Initial encounter. EXAM: CT HEAD WITHOUT CONTRAST TECHNIQUE: Contiguous axial images were obtained from the base of the skull through the vertex without intravenous contrast. COMPARISON:  None. FINDINGS: Brain: Positive for cytotoxic edema in the left MCA territory at the frontal operculum and involving a small portion of the anterior left insula. No associated hemorrhage or mass effect. Lentiform nuclei and internal capsule appear spared. Posterior insula and operculum are spared. No temporal lobe involvement is evident. No superior left MCA territory involvement. Normal gray-white matter differentiation in the right hemisphere and posterior fossa. No ventriculomegaly. Patent basilar cisterns. No intracranial mass effect. No acute intracranial hemorrhage identified. Vascular: Calcified atherosclerosis at the skull base. Positive for abnormal hyperdensity at the distal left MCA M1 segment and left MCA bifurcation (series 6, image 43). Skull: No acute osseous abnormality identified. Sinuses/Orbits: Clear. Other: No acute orbit or scalp soft tissue findings. ASPECTS [facility] Stroke Program Early CT Score) - Ganglionic level infarction (caudate, lentiform nuclei, internal capsule, insula, M1-M3 cortex): 5 (minus 2 for the insula and M1 area). - Supraganglionic infarction (M4-M6 cortex): 3 Total score (0-10 with 10 being normal): 8 IMPRESSION: 1. Positive for a left MCA infarct with operculum cytotoxic edema, no associated hemorrhage. Positive hyperdense distal Left M1 suggesting Emergent Large Vessel Occlusion. 2. ASPECTS is 8. 3. Study discussed by  telephone with Dr. Meade Maw on 02/09/2016 at 13:13 . Electronically Signed   By: Genevie Ann M.D.   On: 02/09/2016 13:14     Assessment/Plan:  82 y.o. male with history of CHF, hypertension, hyperlipidemia brought  into the ED for right-sided weakness and facial droop with slurry speech. Pt seemed normal night prior  Still disoriented possible CAP related on levoflaxacin  MRI and MRA H/N pendnig Pt has hyperdense L MCA on CTH  Will need Pt/Ot wen possible On Anti platelet therapy   02/10/2016, 10:57 AM

## 2016-02-10 NOTE — Progress Notes (Signed)
PT Cancellation Note  Patient Details Name: Douglas Dudek Erhart Sr. MRN: [id number] DOB: 29-Dec-1927   Cancelled Treatment:    Reason Eval/Treat Not Completed: Fatigue/lethargy limiting ability to participate;Patient's level of consciousness. Patient fast asleep, PT attempted sternal rub, calling name, etc. Patient continued to be fast asleep. Will defer evaluation at this time. PT will continue to follow and re-attempt when patient is more alert.   Kerman Passey, PT, DPT    02/10/2016, 1:07 PM

## 2016-02-10 NOTE — Plan of Care (Signed)
Problem: Health Behavior/Discharge Planning: Goal: Ability to manage health-related needs will improve Outcome: Progressing Slept this am  Awake this pm alert and talking  Problem: Physical Regulation: Goal: Ability to maintain clinical measurements within normal limits will improve Outcome: Not Progressing See above note. Unable to get oob per p.t.

## 2016-02-10 NOTE — Progress Notes (Signed)
CH responded to an OR for an AD. Sheridan made several attempts to educate but Pt was asleep each time. Fowler will follow up later this afternoon. CH provided silent prayer at bedside.    02/10/16 1100  Clinical Encounter Type  Visited With Patient  Visit Type Initial;Spiritual support;Other (Comment) (AD)  Referral From Nurse  Spiritual Encounters  Spiritual Needs Prayer  Stress Factors  Patient Stress Factors Exhausted

## 2016-02-11 ENCOUNTER — Inpatient Hospital Stay: Payer: Medicare Other

## 2016-02-11 MED ORDER — ATORVASTATIN CALCIUM 20 MG PO TABS: 20.0000 mg | ORAL_TABLET | Freq: Every day | ORAL | Status: DC

## 2016-02-11 MED ORDER — LEVOFLOXACIN 750 MG PO TABS: 750.0000 mg | ORAL_TABLET | Freq: Every day | ORAL | Status: DC

## 2016-02-11 MED ORDER — ATORVASTATIN CALCIUM 20 MG PO TABS
40.0000 mg | ORAL_TABLET | Freq: Every day | ORAL | Status: DC
  Administered 2016-02-11 – 2016-02-12 (×2): 40 mg via ORAL
  Filled 2016-02-11 (×2): qty 2

## 2016-02-11 MED ORDER — GABAPENTIN 100 MG PO CAPS
100.0000 mg | ORAL_CAPSULE | Freq: Three times a day (TID) | ORAL | Status: DC
  Administered 2016-02-11 (×2): 100 mg via ORAL
  Filled 2016-02-11 (×2): qty 1

## 2016-02-11 MED ORDER — GABAPENTIN 100 MG PO CAPS
100.0000 mg | ORAL_CAPSULE | Freq: Three times a day (TID) | ORAL | Status: DC
  Administered 2016-02-12 – 2016-02-13 (×4): 100 mg via ORAL
  Filled 2016-02-11 (×4): qty 1

## 2016-02-11 MED ORDER — AMLODIPINE BESYLATE 5 MG PO TABS
5.0000 mg | ORAL_TABLET | Freq: Every day | ORAL | Status: DC
Start: 2016-02-11 — End: 2016-02-13
  Administered 2016-02-11 – 2016-02-13 (×3): 5 mg via ORAL
  Filled 2016-02-11 (×3): qty 1

## 2016-02-11 MED ORDER — LEVOFLOXACIN 750 MG PO TABS
750.0000 mg | ORAL_TABLET | Freq: Every day | ORAL | Status: DC
  Administered 2016-02-12 – 2016-02-13 (×2): 750 mg via ORAL
  Filled 2016-02-11 (×4): qty 1

## 2016-02-11 MED ORDER — FINASTERIDE 5 MG PO TABS
5.0000 mg | ORAL_TABLET | Freq: Every day | ORAL | Status: DC
  Administered 2016-02-11 – 2016-02-13 (×3): 5 mg via ORAL
  Filled 2016-02-11 (×3): qty 1

## 2016-02-11 NOTE — Progress Notes (Signed)
PHARMACIST - PHYSICIAN COMMUNICATION  CONCERNING: Antibiotic IV to Oral Route Change Policy  RECOMMENDATION:  This patient is receiving Levofloxacin by the intravenous route.  Based on criteria approved by the Pharmacy and Therapeutics Committee, the antibiotic(s) is/are being converted to the equivalent oral dose form(s).   DESCRIPTION: These criteria include:  Patient being treated for a respiratory tract infection, urinary tract infection, cellulitis or clostridium difficile associated diarrhea if on metronidazole  The patient is not neutropenic and does not exhibit a GI malabsorption state  The patient is eating (either orally or via tube) and/or has been taking other orally administered medications for a least 24 hours  The patient is improving clinically and has a Tmax < 100.5  If you have questions about this conversion, please contact the Pharmacy Department   []   [phone] )  [place], PharmD Clinical Pharmacist 02/11/2016 10:41 AM

## 2016-02-11 NOTE — Progress Notes (Signed)
PT Cancellation Note  Patient Details Name: Mutaz Lunday Wall Sr. MRN: [id number] DOB: [birth year]   Cancelled Treatment:    Reason Eval/Treat Not Completed: Other (comment) (Evaluation re-attempted; patient currently with ST for evaluation.  Will re-attempt at later time this date.)   Khalis Hittle H. Owens Shark, PT, DPT, NCS 02/11/16, 8:51 AM [phone]

## 2016-02-11 NOTE — Clinical Social Work Placement (Signed)
   CLINICAL SOCIAL WORK PLACEMENT  NOTE  Date:  02/11/2016  Patient Details  Name: Douglas Heeb Heatherly Sr. MRN: [id number] Date of Birth: Mar 13, 1928  Clinical Social Work is seeking post-discharge placement for this patient at the [place] level of care (*CSW will initial, date and re-position this form in  chart as items are completed):  Yes   Patient/family provided with [place] Work Department's list of facilities offering this level of care within the geographic area requested by the patient (or if unable, by the patient's family).  Yes   Patient/family informed of their freedom to choose among providers that offer the needed level of care, that participate in Medicare, Medicaid or managed care program needed by the patient, have an available bed and are willing to accept the patient.  Yes   Patient/family informed of 's ownership interest in [facility] and [facility], as well as of the fact that they are under no obligation to receive care at these facilities.  PASRR submitted to EDS on       PASRR number received on       Existing PASRR number confirmed on 02/11/16     FL2 transmitted to all facilities in geographic area requested by pt/family on 02/11/16     FL2 transmitted to all facilities within larger geographic area on       Patient informed that his/her managed care company has contracts with or will negotiate with certain facilities, including the following:        Yes   Patient/family informed of bed offers received.  Patient chooses bed at       Physician recommends and patient chooses bed at      Patient to be transferred to   on  .  Patient to be transferred to facility by       Patient family notified on   of transfer.  Name of family member notified:        PHYSICIAN       Additional Comment:    _______________________________________________ Douglas Parker, Douglas Beets, LCSW 02/11/2016, 1:11 PM

## 2016-02-11 NOTE — Consult Note (Signed)
Pharmacy Antibiotic Note  Douglas ROZZELLE Sr. is a 82 y.o. male admitted on 02/09/2016 with CVA/possible PNA.  Pharmacy has been consulted for levofloxacin dosing.  Plan: Continue:  levofloxacin 750mg  q 24 hours. Patient is taking other PO medications and eating. Will change order to PO levofloxacin.   Height: 5\' 9"  (175.3 cm) Weight: 149 lb (67.6 kg) IBW/kg (Calculated) : 70.7  Temp (24hrs), Avg:98 F (36.7 C), Min:97.5 F (36.4 C), Max:98.6 F (37 C)   Recent Labs Lab 02/08/16 1327 02/09/16 1305  WBC 8.3 7.6  CREATININE 1.04 0.98    Estimated Creatinine Clearance: 51.7 mL/min (by C-G formula based on SCr of 0.98 mg/dL).    No Known Allergies  Antimicrobials this admission: levofloxacin 11/22 >>    Dose adjustments this admission:   Microbiology results:  Chest x-ray 1. Small right-sided pleural effusion, unchanged. Small left-sided pleural effusion, decreased compared to prior. 2. Mild residual left basilar opacity, atelectasis versus infiltrate. 3. Otherwise no significant interval changes.  Thank you for allowing pharmacy to be a part of this patient's care.  [name] M [name] 02/11/2016 10:45 AM

## 2016-02-11 NOTE — Progress Notes (Signed)
[place] at [facility]                                                                                                                                                                                  Patient Demographics   Jeromy Belmonte, is a 82 y.o. male, DOB - May 08, 1927, [id number]  Admit date - 02/09/2016   Admitting Physician Nicholes Mango, MD  Outpatient Primary MD for the patient is Elsie Stain, MD   LOS - 2  Subjective: Pt more awake, able to answer questions, eating own his own  Review of Systems:    CONSTITUTIONAL: No documented fever. No fatigue, weakness. No weight gain, no weight loss.  EYES: No blurry or double vision.  ENT: No tinnitus. No postnasal drip. No redness of the oropharynx.  RESPIRATORY: No cough, no wheeze, no hemoptysis. No dyspnea.  CARDIOVASCULAR: No chest pain. No orthopnea. No palpitations. No syncope.  GASTROINTESTINAL: No nausea, no vomiting or diarrhea. No abdominal pain. No melena or hematochezia.  GENITOURINARY:  No urgency. No frequency. No dysuria. No hematuria. No obstructive symptoms. No discharge. No pain. No significant abnormal bleeding ENDOCRINE: No polyuria or nocturia. No heat or cold intolerance.  HEMATOLOGY: No anemia. No bruising. No bleeding. No purpura. No petechiae INTEGUMENTARY: No rashes. No lesions.  MUSCULOSKELETAL: No arthritis. No swelling. No gout.  NEUROLOGIC: No numbness, tingling, or ataxia. No seizure-type activity.  PSYCHIATRIC: No anxiety. No insomnia. No ADD.     Vitals:   Vitals:   02/10/16 2017 02/11/16 0003 02/11/16 0401 02/11/16 0815  BP: (!) 155/83 (!) 144/73 (!) 153/70 (!) 148/60  Pulse: 70 64 66 67  Resp: (!) 21 19 18 18   Temp: 98 F (36.7 C) 98 F (36.7 C) 98.6 F (37 C) 98.1 F (36.7 C)  TempSrc: Oral Oral Oral Oral  SpO2: 98% 93% 92% 96%  Weight:      Height:        Wt Readings from Last 3 Encounters:  02/09/16 149 lb (67.6 kg)  02/08/16 148 lb 8 oz (67.4  kg)  01/28/16 147 lb (66.7 kg)     Intake/Output Summary (Last 24 hours) at 02/11/16 1207 Last data filed at 02/11/16 0900  Gross per 24 hour  Intake              390 ml  Output              535 ml  Net             -145 ml    Physical Exam:   GENERAL:Appears chronically ill HEAD, EYES, EARS, NOSE AND THROAT: Atraumatic, normocephalic.  Pupils equal and reactive  to light. Sclerae anicteric. No conjunctival injection. No oro-pharyngeal erythema.  NECK: Supple. There is no jugular venous distention. No bruits, no lymphadenopathy, no thyromegaly.  HEART: Regular rate and rhythm,. No murmurs, no rubs, no clicks.  LUNGS: Rhonchus breath sounds bilaterally without any rales or wheezing ABDOMEN: Soft, flat, nontender, nondistended. Has good bowel sounds. No hepatosplenomegaly appreciated.  EXTREMITIES: No evidence of any cyanosis, clubbing, or peripheral edema.  +2 pedal and radial pulses bilaterally.  NEUROLOGIC:Cranial nerves II-12 grossly intact, strength 5 over 5 all 4 extremities reflexes 2+ SKIN: Moist and warm with no rashes appreciated.  Psych: Not anxious LN: No inguinal LN enlargement    Antibiotics   Anti-infectives    Start     Dose/Rate Route Frequency Ordered Stop   02/12/16 1000  levofloxacin (LEVAQUIN) tablet 750 mg     750 mg Oral Daily 02/11/16 1121     02/11/16 1200  levofloxacin (LEVAQUIN) tablet 750 mg  Status:  Discontinued     750 mg Oral Daily 02/11/16 1040 02/11/16 1121   02/10/16 2000  levofloxacin (LEVAQUIN) IVPB 750 mg  Status:  Discontinued     750 mg 100 mL/hr over 90 Minutes Intravenous Daily-1800 02/09/16 1846 02/10/16 1101   02/10/16 1200  levofloxacin (LEVAQUIN) tablet 750 mg  Status:  Discontinued     750 mg Oral Daily 02/10/16 1101 02/10/16 1112   02/10/16 1200  levofloxacin (LEVAQUIN) IVPB 750 mg  Status:  Discontinued     750 mg 100 mL/hr over 90 Minutes Intravenous Daily 02/10/16 1112 02/11/16 1040      Medications   Scheduled Meds: .   stroke: mapping our early stages of recovery book   Does not apply Once  . amLODipine  5 mg Oral Daily  . aspirin EC  325 mg Oral Daily  . atorvastatin  20 mg Oral q1800  . enoxaparin (LOVENOX) injection  40 mg Subcutaneous Q24H  . finasteride  5 mg Oral Daily  . gabapentin  100 mg Oral TID  . [START ON 02/12/2016] levofloxacin  750 mg Oral Daily   Continuous Infusions: PRN Meds:.   Data Review:   Micro Results No results found for this or any previous visit (from the past 240 hour(s)).  Radiology Reports Dg Chest 2 View  Result Date: 02/09/2016 CLINICAL DATA:  Right-sided weakness and facial droop EXAM: CHEST  2 VIEW COMPARISON:  02/08/2016 FINDINGS: Valvular prosthesis again visualized. Bilateral calcified pleural plaques. Bilateral calcified nodules unchanged. No change in small bilateral pleural effusions. Stable mild cardiomegaly with mild central congestion. No pneumothorax. IMPRESSION: 1. No significant interval change in small bilateral pleural effusions and mild left basilar opacity. 2. Stable mild cardiomegaly.  There is mild central congestion. 3. Calcified pleural plaques and small calcified lung nodules as before. Electronically Signed   By: Donavan Foil M.D.   On: 02/09/2016 20:44   Dg Chest 2 View  Result Date: 02/08/2016 CLINICAL DATA:  Shortness of breath, history of pneumonia EXAM: CHEST  2 VIEW COMPARISON:  01/11/2016, 01/10/2016, 02/04/2014 FINDINGS: There are small bilateral pleural effusions, unchanged on the right and decreased on the left. Overall improved aeration of the left lung base with residual airspace opacity noted. Stable cardiomediastinal silhouette with stent. Bilateral nodular opacities and calcified pleural plaques are grossly unchanged. Atherosclerosis. No pneumothorax. IMPRESSION: 1. Small right-sided pleural effusion, unchanged. Small left-sided pleural effusion, decreased compared to prior. 2. Mild residual left basilar opacity, atelectasis versus  infiltrate. 3. Otherwise no significant interval changes. Electronically Signed  By: Donavan Foil M.D.   On: 02/08/2016 22:49   US Carotid Bilateral (at [facility] Only)  Result Date: 02/10/2016 CLINICAL DATA:  CVA.  History of hypertension and hyperlipidemia. EXAM: BILATERAL CAROTID DUPLEX ULTRASOUND TECHNIQUE: Pearline Cables scale imaging, color Doppler and duplex ultrasound were performed of bilateral carotid and vertebral arteries in the neck. COMPARISON:  None. FINDINGS: Criteria: Quantification of carotid stenosis is based on velocity parameters that correlate the residual internal carotid diameter with NASCET-based stenosis levels, using the diameter of the distal internal carotid lumen as the denominator for stenosis measurement. The following velocity measurements were obtained: RIGHT ICA:  101/21 cm/sec CCA:  [id number] cm/sec SYSTOLIC ICA/CCA RATIO:  1.1 DIASTOLIC ICA/CCA RATIO:  1.6 ECA:  125 cm/sec LEFT ICA:  113/23 cm/sec CCA:  [id number] cm/sec SYSTOLIC ICA/CCA RATIO:  1.2 DIASTOLIC ICA/CCA RATIO:  1.6 ECA:  125 cm/sec RIGHT CAROTID ARTERY: There is a minimal amount of eccentric mixed echogenic plaque within the right carotid bulb (images 12 and 13), extending to involve the origin proximal aspect the right internal carotid artery (image 20), not resulting in elevated peak systolic velocities within the interrogated course the right internal carotid artery to suggest a hemodynamically significant stenosis. RIGHT VERTEBRAL ARTERY:  Antegrade Flow LEFT CAROTID ARTERY: There is a minimal amount of focal eccentric echogenic plaque involving the proximal (images 31 and 32) and mid (images 35 and 36) aspects of the left common carotid artery. There is a minimal to moderate amount of eccentric mixed echogenic plaque within the left carotid bulb (images 44 and 45). There is a moderate to large amount of eccentric mixed echogenic plaque involving the origin and proximal aspects of the left internal carotid artery (image  53), not resulting in elevated peak systolic velocities within the interrogated course of the left internal carotid artery to suggest a hemodynamically significant stenosis. LEFT VERTEBRAL ARTERY:  Antegrade Flow IMPRESSION: 1. Moderate to large amount of left-sided atherosclerotic plaque, not resulting in hemodynamically significant stenosis. 2. Minimal amount of right-sided atherosclerotic plaque, not resulting in a hemodynamically significant stenosis. Electronically Signed   By: Sandi Mariscal M.D.   On: 02/10/2016 10:37   Ct Head Code Stroke W/o Cm  Result Date: 02/09/2016 CLINICAL DATA:  Code stroke. 82 year old male with confusion right side weakness facial droop and slurred speech. Initial encounter. EXAM: CT HEAD WITHOUT CONTRAST TECHNIQUE: Contiguous axial images were obtained from the base of the skull through the vertex without intravenous contrast. COMPARISON:  None. FINDINGS: Brain: Positive for cytotoxic edema in the left MCA territory at the frontal operculum and involving a small portion of the anterior left insula. No associated hemorrhage or mass effect. Lentiform nuclei and internal capsule appear spared. Posterior insula and operculum are spared. No temporal lobe involvement is evident. No superior left MCA territory involvement. Normal gray-white matter differentiation in the right hemisphere and posterior fossa. No ventriculomegaly. Patent basilar cisterns. No intracranial mass effect. No acute intracranial hemorrhage identified. Vascular: Calcified atherosclerosis at the skull base. Positive for abnormal hyperdensity at the distal left MCA M1 segment and left MCA bifurcation (series 6, image 43). Skull: No acute osseous abnormality identified. Sinuses/Orbits: Clear. Other: No acute orbit or scalp soft tissue findings. ASPECTS [facility] Stroke Program Early CT Score) - Ganglionic level infarction (caudate, lentiform nuclei, internal capsule, insula, M1-M3 cortex): 5 (minus 2 for the insula  and M1 area). - Supraganglionic infarction (M4-M6 cortex): 3 Total score (0-10 with 10 being normal): 8 IMPRESSION: 1. Positive for a left MCA  infarct with operculum cytotoxic edema, no associated hemorrhage. Positive hyperdense distal Left M1 suggesting Emergent Large Vessel Occlusion. 2. ASPECTS is 8. 3. Study discussed by telephone with Dr. Meade Maw on 02/09/2016 at 13:13 . Electronically Signed   By: Genevie Ann M.D.   On: 02/09/2016 13:14     CBC  Recent Labs Lab 02/08/16 1327 02/09/16 1305  WBC 8.3 7.6  HGB 11.6* 12.1*  HCT 34.5* 34.5*  PLT 187.0 175  MCV 89.7 88.9  MCH  --  31.1  MCHC 33.7 35.0  RDW 14.5 14.3  LYMPHSABS 0.9 0.7*  MONOABS 0.5 0.6  EOSABS 0.0 0.0  BASOSABS 0.0 0.0    Chemistries   Recent Labs Lab 02/08/16 1327 02/09/16 1305  NA 145 143  K 4.2 3.8  CL 108 110  CO2 31 27  GLUCOSE 124* 101*  BUN 29* 29*  CREATININE 1.04 0.98  CALCIUM 9.1 8.7*  AST 11 22  ALT 14 16*  ALKPHOS 65 64  BILITOT 1.0 1.2   ------------------------------------------------------------------------------------------------------------------ estimated creatinine clearance is 51.7 mL/min (by C-G formula based on SCr of 0.98 mg/dL). ------------------------------------------------------------------------------------------------------------------ No results for input(s): HGBA1C in the last 72 hours. ------------------------------------------------------------------------------------------------------------------  Recent Labs  02/10/16 0522  CHOL 131  HDL 49  LDLCALC 72  TRIG 52  CHOLHDL 2.7   ------------------------------------------------------------------------------------------------------------------  Recent Labs  02/10/16 0522  TSH 0.873   ------------------------------------------------------------------------------------------------------------------ No results for input(s): VITAMINB12, FOLATE, FERRITIN, TIBC, IRON, RETICCTPCT in the last 72  hours.  Coagulation profile  Recent Labs Lab 02/09/16 1305  INR 1.24    No results for input(s): DDIMER in the last 72 hours.  Cardiac Enzymes  Recent Labs Lab 02/09/16 1700 02/09/16 2319 02/10/16 0522  TROPONINI 0.05* 0.04* 0.05*   ------------------------------------------------------------------------------------------------------------------ Invalid input(s): POCBNP    Assessment & Plan   Caysin Valliant  is a 82 y.o. male with a known history of Congestive heart failure, hypertension, hyperlipidemia and multiple other medical problems is brought into the ED for right-sided weakness and facial droop with slurry speech. According to the family members patient's symptoms started at around 4 -6 am patient is brought into the ED CT head has revealed left MCA infarct  #Acute CVA-left MCA infarct on CT head Await MRI of the brain Continue antiplatelets therapy Seen by PT recommend skilled nursing facility Continuous therapy atorvastatin  #Aspiration Pneumonia Continue therapy with Levaquin Modified diet   #. Essential hypertension Blood pressure stable Restarted on  amlodipine   #Benign prostatic hypertrophy  Proscar has been restarted    #Miscellaneous Lovenox for DVT prophylaxis  All the records are reviewed and case discussed with ED provider.   CODE STATUS: Full code prognosis very poor     Code Status Orders        Start     Ordered   02/09/16 1826  Full code  Continuous     02/09/16 1825    Code Status History    Date Active Date Inactive Code Status Order ID Comments User Context   01/05/2016 10:39 PM 01/12/2016  9:25 PM Full Code [id number]  Demetrios Loll, MD Inpatient   02/03/2014  2:18 PM 02/06/2014  5:53 PM Full Code [id number]  Sherren Mocha, MD Inpatient           Consults  neurology  DVT Prophylaxis  Lovenox    Lab Results  Component Value Date   PLT 175 02/09/2016     Time Spent in minutes   75min  Greater than 50%  of time spent in care coordination and counseling patient regarding the condition and plan of care.   Dustin Flock M.D on 02/11/2016 at 12:07 PM  Between 7am to 6pm - Pager - [phone]  After 6pm go to [email or web address] - password [email or web address] [place]   Office  [phone]

## 2016-02-11 NOTE — Progress Notes (Signed)
Dr. Posey Pronto notified patient has become more lethargic and confused in the past hour. Alert, not following commands, unable to feed himself as if he's unsure what to do with the food (whereas he fed himself completely for breakfast), mumbled speech. Per MRI, screening completed by son, [name] and they should be coming to get patient soon for MRI.

## 2016-02-11 NOTE — Clinical Social Work Note (Signed)
Clinical Social Work Assessment  Patient Details  Name: Douglas CERASOLI Sr. MRN: [id number] Date of Birth: [birth year]  Date of referral:  02/11/16               Reason for consult:  Facility Placement                Permission sought to share information with:  Chartered certified accountant granted to share information::  Yes, Verbal Permission Granted  Name::      [place]::   [place]   Relationship::     Contact Information:     Housing/Transportation Living arrangements for the past 2 months:  [place] of Information:  Adult Children Patient Interpreter Needed:  None Criminal Activity/Legal Involvement Pertinent to Current Situation/Hospitalization:  No - Comment as needed Significant Relationships:  Adult Children, Spouse Lives with:  Spouse Do you feel safe going back to the place where you live?  Yes Need for family participation in patient care:  Yes (Comment)  Care giving concerns:  Patient lives in [place] with his wife Douglas Parker.    Social Worker assessment / plan:  Holiday representative (CSW) received verbal consult from MD that PT is recommending SNF. Per chart patient is not alert and oriented. Patient was recently at [facility] for rehab. CSW attempted to contact patient's wife Douglas Parker however she did not answer and a voicemail was left. CSW contacted patient's son Douglas Parker. Per son patient lives with his wife Douglas Parker in [place] and recently went to [facility]. Son would like for patient to return to [facility]. Son reported that patient asked to go back to [facility] when he was at home.  FL2 complete and faxed out. CSW will continue to follow and assist as needed.   Employment status:  Retired Nurse, adult PT Recommendations:  [place] / Referral to community resources:  [place]  Patient/Family's  Response to care:  Patient was not alert and oriented and did not participate in assessment. Patient's son is agreeable for SNF search and prefers [facility].   Patient/Family's Understanding of and Emotional Response to Diagnosis, Current Treatment, and Prognosis:  Patient's son was very pleasant and thanked CSW for assistance.   Emotional Assessment Appearance:  Appears stated age Attitude/Demeanor/Rapport:    Affect (typically observed):  Unable to Assess Orientation:  Oriented to Self, Fluctuating Orientation (Suspected and/or reported Sundowners) Alcohol / Substance use:  Not Applicable Psych involvement (Current and /or in the community):  No (Comment)  Discharge Needs  Concerns to be addressed:  Discharge Planning Concerns Readmission within the last 30 days:  No Current discharge risk:  Dependent with Mobility Barriers to Discharge:  Continued Medical Work up   [employer], Veronia Beets, LCSW 02/11/2016, 1:12 PM

## 2016-02-11 NOTE — Progress Notes (Signed)
Unable to get in touch with patient's wife, Fraser Din after multiple phone call attempts. Son, Baxley Hemp [phone]) is going to call MRI [phone]) to complete MRI screening since patient's orientation is not consistent.

## 2016-02-11 NOTE — NC FL2 (Signed)
[place] LEVEL OF CARE SCREENING TOOL     IDENTIFICATION  Patient Name: Douglas Beare Schwinn Sr. Birthdate: Apr 30, 1927 Sex: male Admission Date (Current Location): 02/09/2016  [place] and [place] Number:  Engineering geologist and Address:  [facility], [address], [place], [place] [address]      Provider Number: [id number]  Attending Physician Name and Address:  Dustin Flock, MD  Relative Name and Phone Number:       Current Level of Care: Hospital Recommended Level of Care: [place] Prior Approval Number:    Date Approved/Denied:   PASRR Number:  ( [id number] )  Discharge Plan: SNF    Current Diagnoses: Patient Active Problem List   Diagnosis Date Noted  . Acute CVA (cerebrovascular accident) ([place]) 02/09/2016  . Pneumonia 01/05/2016  . Atypical chest pain 12/01/2015  . Cervical lymphadenopathy 04/20/2015  . CCF (congestive cardiac failure) ([place]) 04/03/2014  . BPH (benign prostatic hyperplasia) 02/10/2014  . Dyslipidemia 02/10/2014  . GERD (gastroesophageal reflux disease) 02/10/2014  . Severe aortic valve stenosis 02/03/2014  . 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 Aortic stenosis, severe 12/19/2013  . Chronic diastolic congestive heart failure ([place])   . Back pain   . Edema 11/20/2013  . Loss of weight 06/26/2013  . Cornea scar 04/09/2013  . Chronic glaucoma 12/17/2012  . Aortic valve stenosis 09/10/2012  . Pseudoaphakia 06/28/2012  . Living will, counseling/discussion 12/28/2011  . 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 Degeneration macular 03/06/2011  . Constipation 01/02/2007  . Hypertensive heart disease with CHF (congestive heart failure) ([place]) 05/19/2003  . CA IN SITU, SKIN NOS 05/19/1999  . Arthropathy of pelvic region and thigh 05/18/1997  . HEMORRHOIDS, INTERNAL, WITH BLEEDING 03/20/1994  . DIVERTICULOSIS, COLON 02/17/1994  . FX OPEN MLT PELVIS W/PELVIC CIRCULAT  DISRUPT 03/20/1986  . SCOLIOSIS, THORACIC SPINE 03/20/1944    Orientation RESPIRATION BLADDER Height & Weight     Self  Normal Continent Weight: 149 lb (67.6 kg) Height:  5\' 9"  (175.3 cm)  BEHAVIORAL SYMPTOMS/MOOD NEUROLOGICAL BOWEL NUTRITION STATUS   (none )  (none) Continent Diet (Diet: DYS 3 )  AMBULATORY STATUS COMMUNICATION OF NEEDS Skin   Extensive Assist Verbally Normal                       Personal Care Assistance Level of Assistance  Bathing, Feeding, Dressing Bathing Assistance: Limited assistance Feeding assistance: Independent Dressing Assistance: Limited assistance     Functional Limitations Info  Sight, Hearing, Speech Sight Info: Adequate Hearing Info: Adequate Speech Info: Adequate    SPECIAL CARE FACTORS FREQUENCY  PT (By licensed PT), OT (By licensed OT)     PT Frequency:  (5) OT Frequency:  (5)            Contractures      Additional Factors Info  Code Status, Allergies Code Status Info:  (Full Code. ) Allergies Info:  (No Known Allergies. )           Current Medications (02/11/2016):  This is the current hospital active medication list Current Facility-Administered Medications  Medication Dose Route Frequency Provider Last Rate Last Dose  .  stroke: mapping our early stages of recovery book   Does not apply Once Nicholes Mango, MD      . amLODipine (NORVASC) tablet 5 mg  5 mg Oral Daily Dustin Flock, MD   5 mg at 02/11/16 0912  . aspirin EC tablet  325 mg  325 mg Oral Daily Nicholes Mango, MD   325 mg at 02/11/16 0813  . atorvastatin (LIPITOR) tablet 20 mg  20 mg Oral q1800 Dustin Flock, MD      . enoxaparin (LOVENOX) injection 40 mg  40 mg Subcutaneous Q24H Nicholes Mango, MD   40 mg at 02/10/16 2059  . finasteride (PROSCAR) tablet 5 mg  5 mg Oral Daily Dustin Flock, MD   5 mg at 02/11/16 0912  . gabapentin (NEURONTIN) capsule 100 mg  100 mg Oral TID Dustin Flock, MD   100 mg at 02/11/16 0912  . hydrALAZINE (APRESOLINE) injection 10  mg  10 mg Intravenous Q6H PRN Dustin Flock, MD      . Derrill Memo ON 02/12/2016] levofloxacin (LEVAQUIN) tablet 750 mg  750 mg Oral Daily Dustin Flock, MD         Discharge Medications: Please see discharge summary for a list of discharge medications.  Relevant Imaging Results:  Relevant Lab Results:   Additional Information  (SSN: [id number])  Mickael Mcnutt, Veronia Beets, LCSW

## 2016-02-11 NOTE — Evaluation (Signed)
Occupational Therapy Evaluation Patient Details Name: Douglas Brothers Milleson Sr. MRN: [id number] DOB: 10-09-1927 Today's Date: 02/11/2016    History of Present Illness Pt. is an 82 y.o. male who was admitted to [facility] with right sided weakness.   Clinical Impression   Pt. is an 82 y.o. Male who was admitted to [facility] with right sided weakness. Pt. presents with right sided weakness, impaired strength, impaired ROM, impaired coordination, decreased motor control, apraxia, and decreased functional mobility for ADLs which hinder his ability to complete ADL, and IADL functioning. Pt. could benefit from skilled OT services for ADL training, neuromuscular re-ed, functional mobility for ADLs, and pt. family education. Pt. Could benefit from follow-up OT services upon discharge. Recommend SNF level of care.    Follow Up Recommendations  SNF    Equipment Recommendations       Recommendations for Other Services PT consult     Precautions / Restrictions Precautions Precautions: Fall Restrictions Weight Bearing Restrictions: No              ADL Overall ADL's : Needs assistance/impaired                                       General ADL Comments: Pt. is using his left hand to attempt to feed himself.  Pt. is able to hold th utensil, and bring it to his mouth with cues, however had difficulty using his mouth to take the food off the spoon. Occassionally pt. would bring his head towards the plate. Pt. was unable to engage his LUE in the task.     Vision     Perception     Praxis      Pertinent Vitals/Pain Pain Assessment: 0-10 Pain Score: 0-No pain     Hand Dominance Right   Extremity/Trunk Assessment Upper Extremity Assessment Upper Extremity Assessment: RUE deficits/detail RUE Deficits / Details: Impaired ROM , strength, motor control, and coordination. RUE: Unable to fully assess due to pain        Communication Communication Communication:  (speech  generally garbled and 'tremulous')   Cognition Arousal/Alertness: Lethargic;Awake/alert Behavior During Therapy: WFL for tasks assessed/performed Overall Cognitive Status: Difficult to assess                     General Comments       Exercises   Shoulder Instructions     Home Living Family/patient expects to be discharged to:: Private residence Living Arrangements: Spouse/significant other Available Help at Discharge: Family Type of Home: House Home Access: Ramped entrance     Home Layout: One level               Home Equipment: Environmental consultant - 2 wheels;Cane - single point          Prior Functioning/Environment Level of Independence: Independent        Comments: Indep for ADLs; limited household ambulator, occasionally used [facility] for mobility; + driving.  Denies fall history outside of this event        OT Problem List: Decreased strength;Decreased range of motion;Decreased coordination;Impaired UE functional use;Decreased cognition;Decreased knowledge of use of DME or AE   OT Treatment/Interventions: Self-care/ADL training;Therapeutic exercise;Neuromuscular education;DME and/or AE instruction;Therapeutic activities;Patient/family education    OT Goals(Current goals can be found in the care plan section) Acute Rehab OT Goals Patient Stated Goal: to get better OT Goal Formulation: Patient unable to participate in  goal setting  OT Frequency: Min 1X/week   Barriers to D/C:            Co-evaluation              End of Session    Activity Tolerance: Patient tolerated treatment well Patient left: in chair, call bell in reach   Time: 1245-1308 OT Time Calculation (min): 23 min Charges:  OT General Charges $OT Visit: 1 Procedure OT Evaluation $OT Eval Moderate Complexity: 1 Procedure G-Codes: OT G-codes **NOT FOR INPATIENT CLASS** Functional Assessment Tool Used: clinical judgement based on pt. current functional status  Harrel Carina, MS,  OTR/L 02/11/2016, 1:53 PM

## 2016-02-11 NOTE — Care Management Important Message (Signed)
Important Message  Patient Details  Name: Douglas BURRS Sr. MRN: [id number] Date of Birth: [birth year]   Medicare Important Message Given:  Yes    Shelbie Ammons, RN 02/11/2016, 8:27 AM

## 2016-02-11 NOTE — Evaluation (Addendum)
Clinical/Bedside Swallow Evaluation Patient Details  Name: Douglas Parker. MRN: [id number] Date of Birth: 20-Feb-1928  Today's Date: 02/11/2016 Time: SLP Start Time (ACUTE ONLY): 0830 SLP Stop Time (ACUTE ONLY): 0930 SLP Time Calculation (min) (ACUTE ONLY): 60 min  Past Medical History:  Past Medical History:  Diagnosis Date  . Aortic valve stenosis 09/10/2012  . Arthritis   . Back pain   . Benign prostatic hypertrophy 10/18/00  . Cancer ([place])    skin cancers  . CHF (congestive heart failure) ([place]) 04/21/03  . Chronic diastolic congestive heart failure ([place])   . Colon polyps 06/05/2002   path could not be found   . Diverticulosis of colon 06/05/2002  . Hyperlipidemia 09/05/95  . Hypertension 05/19/03  . 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 Scoliosis   . Thinning of skin    bruises easy.   Past Surgical History:  Past Surgical History:  Procedure Laterality Date  . CARDIAC CATHETERIZATION  10/27/2013  . COLONOSCOPY W/ BIOPSIES  05/2002  . EYE SURGERY     bilateral cataracts  . FLEXIBLE SIGMOIDOSCOPY  07/2005  . INTRAOPERATIVE TRANSESOPHAGEAL ECHOCARDIOGRAM N/A 02/03/2014   Procedure: INTRAOPERATIVE TRANSESOPHAGEAL ECHOCARDIOGRAM;  Surgeon: Sherren Mocha, MD;  Location: [facility] OR;  Service: Open Heart Surgery;  Laterality: N/A;  . KNEE ARTHROSCOPY  06/1997   right  . PLEURAL EFFUSION DRAINAGE     2017  . TEE WITHOUT CARDIOVERSION    . TONSILLECTOMY    . TRANSCATHETER AORTIC VALVE REPLACEMENT, TRANSFEMORAL N/A 02/03/2014   Procedure: TRANSCATHETER AORTIC VALVE REPLACEMENT, TRANSFEMORAL;  Surgeon: Sherren Mocha, MD;  Location: [facility];  Service: Open Heart Surgery;  Laterality: N/A;   HPI:  Pt is a 82 y.o. male with a known history of TAVR, diverticulosis, Scoliosis, Congestive heart failure, hypertension, hyperlipidemia and multiple other medical problems brought  into the ED for right-sided weakness and facial droop with slurry speech. According to the family members patient's symptoms started at around 4-6 am. 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 Pt also has a h/o Pneumonia per MD; CXRs in the past 1 year were noted. Currently, pt is awake, verbally conversive. Noted tremorous UEs and oral tremors when drinking liquids. Pt answered basic questions re: self and followed basic commands. He appeared slightly distracted and required verbal cues to reattend to tasks. Pt fed self w/ tray setup.    Assessment / Plan / Recommendation Clinical Impression   Pt appeared to present w/ inconsistent toleration of diet of thin liquids and soft solids. Pt exhibited overt, delayed coughing x2 w/ thin liquids during the meal, however, toward the end of the session when only drinking liquids via cup, he appeared to tolerate the thin liquids w/ no further coughing or overt s/s of aspiration. Pt tended to eat slightly impulsively putting consecutive bites of foods in his mouth w/out fully clearing b/f bites. Unsure if the coughing w/ thin liquids came from a mixed consistency mixture in the mouth vs when he drank thin liquids alone via cup. Oral phase appeared grossly wfl for management and clearing of the softened solids.  Due to MD's concern of recent pneumonia(s) and the intermittent, overt s/s of aspiration, he suggested downgrading the diet at this time. ST will recommend a Dysphagia 3 w/ Nectar liquids w/ general aspiration precautions and meds in Puree while pt's medical and respiratory status' improve. Pt agreed. (unsure of pt's  baseline Cognitive status which can contribute to dysphagia.) NSG updated.     Aspiration Risk  Mild aspiration risk    Diet Recommendation  Dysphagia 3 w/ Nectar liquids at this time; aspiration precautions; tray setup and assist at meals as needed  Medication Administration: Whole meds with puree (or crushed if necessary, able to)    Other   Recommendations Recommended Consults:  (dietician) Oral Care Recommendations: Oral care BID;Staff/trained caregiver to provide oral care Other Recommendations: Order thickener from pharmacy;Prohibited food (jello, ice cream, thin soups);Remove water pitcher;Have oral suction available   Follow up Recommendations Skilled Nursing facility      Frequency and Duration min 3x week  2 weeks       Prognosis Prognosis for Safe Diet Advancement: Fair Barriers to Reach Goals: Cognitive deficits (?)      Swallow Study   General Date of Onset: 02/09/16 HPI: Pt is a 82 y.o. male with a known history of TAVR, diverticulosis, Scoliosis, Congestive heart failure, hypertension, hyperlipidemia and multiple other medical problems brought into the ED for right-sided weakness and facial droop with slurry speech. According to the family members patient's symptoms started at around 4-6 am. 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 Pt also has a h/o Pneumonia per MD; CXRs in the past 1 year were noted. Currently, pt is awake, verbally conversive. Noted tremorous UEs and oral tremors when drinking liquids. Pt answered basic questions re: self and followed basic commands. He appeared slightly distracted and required verbal cues to reattend to tasks. Pt fed self w/ tray setup.  Type of Study: Bedside Swallow Evaluation Previous Swallow Assessment: none indicated Diet Prior to this Study: Regular;Thin liquids Temperature Spikes Noted: No Respiratory Status: Room air History of Recent Intubation: No Behavior/Cognition: Alert;Cooperative;Pleasant mood;Distractible;Requires cueing Oral Cavity Assessment: Within Functional Limits (though eating) Oral Care Completed by SLP: Recent completion by staff (also eating) Oral Cavity - Dentition: Dentures, top;Dentures, bottom Vision: Functional for self-feeding Self-Feeding Abilities: Able to feed self;Needs set up Patient Positioning: Upright in bed Baseline Vocal Quality:  Normal;Low vocal intensity Volitional Cough: Strong;Congested (min) Volitional Swallow: Able to elicit    Oral/Motor/Sensory Function Overall Oral Motor/Sensory Function: Mild impairment (slight-mild) Facial ROM: Reduced right (slight) Facial Symmetry: Abnormal symmetry right (slight) Facial Strength: Reduced right (slight) Facial Sensation: Within Functional Limits Lingual ROM: Within Functional Limits (grossly) Lingual Symmetry: Within Functional Limits (grossly) Lingual Strength: Within Functional Limits Lingual Sensation: Within Functional Limits Velum: Within Functional Limits Mandible: Within Functional Limits   Ice Chips Ice chips: Within functional limits Presentation: Spoon (x3)   Thin Liquid Thin Liquid: Impaired Presentation: Cup;Straw;Self Fed (~4-5 ozs total w/ water, then juice/coffee) Oral Phase Impairments:  (none) Oral Phase Functional Implications:  (none) Pharyngeal  Phase Impairments: Cough - Delayed (x1 each w/ liquids via straw, then cup) Other Comments: pt was noted to have tremorous oral movements    Nectar Thick Nectar Thick Liquid: Within functional limits Presentation: Cup;Self Fed (~6-7 trials)   Honey Thick Honey Thick Liquid: Not tested   Puree Puree: Within functional limits Presentation: Self Fed;Spoon (5 trials)   Solid   GO   Solid: Within functional limits (grossly) Presentation: Self Fed;Spoon (10+ trials) Other Comments: shakiness in UEs during self feeding; tended to eat somewhat impulsively not taking many breaks b/t bites         Orinda Kenner, MS, CCC-SLP Adrionna Delcid 02/11/2016,9:55 AM

## 2016-02-11 NOTE — Progress Notes (Addendum)
Clinical Education officer, museum (CSW) contacted patient's son [name] and presented bed offers. Son chose [facility] Per [facility] admissions coordinator at [facility] patient can come over the weekend if stable. CSW made son aware that patient may be close to his co-pays days for SNF because he was at [facility] recently. Son verbalized his understanding. CSW will continue to follow and assist as needed.   Patient's wife Fraser Din called CSW back and is in agreement with plan.   [employer], LCSW [phone]

## 2016-02-11 NOTE — Evaluation (Signed)
Physical Therapy Evaluation Patient Details Name: Douglas Crouch Viera Sr. MRN: [id number] DOB: Dec 01, 1927 Today's Date: 02/11/2016   History of Present Illness  presented to ER with R-sided weakness, facial droop; admitted with L MCA CVA (noted on CT) and CAP.  Pending MRI.  Clinical Impression  Upon evaluation, patient alert and oriented to basic information; generally confused to more complex information requiring short-term memory or higher-level problem solving.  R hemi-body mildly hemiparetic, but no focal sensory deficits.  Generally tremulous with limited coordination and motor control.  Currently requiring min assist for bed mobility; min/mod assist for sit/stand, basic transfers and gait (10') without assist device.  Persistent R posterior/lateral lean with all standing activities requiring hands-on facilitation from therapist to maintain midline/prevent LOB with all standing activities.  Very minimal/no functional reach in standing, indicative of very high fall risk with mobility.  Recommend use of RW and +1 assist with all mobility at this time. Would benefit from skilled PT to address above deficits and promote optimal return to PLOF; recommend transition to STR upon discharge from acute hospitalization.     Follow Up Recommendations SNF    Equipment Recommendations       Recommendations for Other Services       Precautions / Restrictions Precautions Precautions: Fall Restrictions Weight Bearing Restrictions: No      Mobility  Bed Mobility Overal bed mobility: Needs Assistance Bed Mobility: Supine to Sit     Supine to sit: Min assist     General bed mobility comments: heavy use of elevated HOB and bedrails to complete  Transfers Overall transfer level: Needs assistance Equipment used: Rolling walker (2 wheeled) Transfers: Sit to/from Stand Sit to Stand: Mod assist;Min assist         General transfer comment: R posterior/lateral lean, broad BOS; requires UE  support thorughout  Ambulation/Gait Ambulation/Gait assistance: Min assist;Mod assist Ambulation Distance (Feet): 10 Feet Assistive device: None       General Gait Details: constant R posterior/lateral weight shift, LOB throughout gait cycle.  Very unsteady, constantly reaching for walls/furniture for external support.  Very antalgic due to R posterior/lateral hip weakness  Stairs            Wheelchair Mobility    Modified Rankin (Stroke Patients Only)       Balance Overall balance assessment: Needs assistance Sitting-balance support: No upper extremity supported;Feet supported Sitting balance-Leahy Scale: Good     Standing balance support: No upper extremity supported Standing balance-Leahy Scale: Poor Standing balance comment: requires min assist to maintain static stance; standing functional reach no greater than 1", indicative of high fall risk                             Pertinent Vitals/Pain Pain Assessment: No/denies pain    Home Living Family/patient expects to be discharged to:: Private residence Living Arrangements: Spouse/significant other Available Help at Discharge: Family Type of Home: House Home Access: Ramped entrance     Home Layout: One level Home Equipment: Environmental consultant - 2 wheels;Cane - single point      Prior Function Level of Independence: Independent         Comments: Indep for ADLs; limited household ambulator, occasionally used [facility] for mobility; + driving.  Denies fall history outside of this event     Hand Dominance   Dominant Hand: Right    Extremity/Trunk Assessment   Upper Extremity Assessment: Overall WFL for tasks assessed (bilat UEs grossly  4-/5; no significant focal weakness noted; denies sensory changes.  R > L UE generally tremulous)           Lower Extremity Assessment: Overall WFL for tasks assessed (grossly 4/5 throughout without significant focal weakness; no sensory change.  Negative clonus,  babinski bilat.  Significant functional weakness R posterior/latera hip in closed chain posiition)      Cervical / Trunk Assessment:  (significant scoliotic curve towards R (baseline))  Communication   Communication:  (speech generally garbled and 'tremulous')  Cognition Arousal/Alertness: Awake/alert Behavior During Therapy: WFL for tasks assessed/performed Overall Cognitive Status: Difficult to assess (oriented to basic information; generally confused to more complex information requiring short-term recall and problem solving)                      General Comments      Exercises Other Exercises Other Exercises: 25' with RW, min assist-improved stability, though with persistent antalgic gait pattern; min assist R posterior/lateral hip for weight shift and stability in R LE loading phases of gait cycle Other Exercises: Toilet transfer, SPT without assist device, min/mod assist; requires UE support throughout for balance/safety.  PAtient with CNA end of session for hygiene and return to chair.   Assessment/Plan    PT Assessment Patient needs continued PT services  PT Problem List Decreased strength;Decreased range of motion;Decreased activity tolerance;Decreased balance;Decreased mobility;Decreased coordination;Decreased cognition;Decreased knowledge of use of DME;Decreased safety awareness;Decreased knowledge of precautions          PT Treatment Interventions DME instruction;Therapeutic exercise;Gait training;Stair training;Functional mobility training;Therapeutic activities;Balance training;Neuromuscular re-education;Patient/family education    PT Goals (Current goals can be found in the Care Plan section)  Acute Rehab PT Goals Patient Stated Goal: to get better PT Goal Formulation: With patient Time For Goal Achievement: 02/25/16 Potential to Achieve Goals: Fair    Frequency 7X/week   Barriers to discharge Decreased caregiver support      Co-evaluation                End of Session Equipment Utilized During Treatment: Gait belt Activity Tolerance: Patient tolerated treatment well Patient left:  (on BSC with CNA in room; CNA to assist with hygiend and return to chair/set alarm as appropriate) Nurse Communication: Mobility status         Time: [id number] PT Time Calculation (min) (ACUTE ONLY): 32 min   Charges:   PT Evaluation $PT Eval Moderate Complexity: 1 Procedure PT Treatments $Gait Training: 8-22 mins   PT G Codes:        Jehiel Koepp H. Owens Shark, PT, DPT, NCS 02/11/16, 10:31 AM [phone]

## 2016-02-12 LAB — HEMOGLOBIN A1C
HEMOGLOBIN A1C: 5.5 % (ref 4.8–5.6)
MEAN PLASMA GLUCOSE: 111 mg/dL

## 2016-02-12 MED ORDER — MODAFINIL 100 MG PO TABS
100.0000 mg | ORAL_TABLET | Freq: Every day | ORAL | Status: DC
  Administered 2016-02-12 – 2016-02-13 (×2): 100 mg via ORAL
  Filled 2016-02-12 (×2): qty 1

## 2016-02-12 NOTE — Progress Notes (Signed)
[place] at [facility]                                                                                                                                                                                  Patient Demographics   Avien Benard, is a 82 y.o. male, DOB - 11-22-1927, [id number]  Admit date - 02/09/2016   Admitting Physician Nicholes Mango, MD  Outpatient Primary MD for the patient is Elsie Stain, MD   LOS - 3  Subjective: Patient had a episode yesterday where he wasn't responding didn't know how to drink his water or eat. This lasted for a few hours according to the nurse now this morning is more awake drinking his water eating breakfast.  Review of Systems:    CONSTITUTIONAL: No documented fever. No fatigue, weakness. No weight gain, no weight loss.  EYES: No blurry or double vision.  ENT: No tinnitus. No postnasal drip. No redness of the oropharynx.  RESPIRATORY: No cough, no wheeze, no hemoptysis. No dyspnea.  CARDIOVASCULAR: No chest pain. No orthopnea. No palpitations. No syncope.  GASTROINTESTINAL: No nausea, no vomiting or diarrhea. No abdominal pain. No melena or hematochezia.  GENITOURINARY:  No urgency. No frequency. No dysuria. No hematuria. No obstructive symptoms. No discharge. No pain. No significant abnormal bleeding ENDOCRINE: No polyuria or nocturia. No heat or cold intolerance.  HEMATOLOGY: No anemia. No bruising. No bleeding. No purpura. No petechiae INTEGUMENTARY: No rashes. No lesions.  MUSCULOSKELETAL: No arthritis. No swelling. No gout.  NEUROLOGIC: No numbness, tingling, or ataxia. No seizure-type activity.  PSYCHIATRIC: No anxiety. No insomnia. No ADD.     Vitals:   Vitals:   02/11/16 2010 02/12/16 0011 02/12/16 0425 02/12/16 0749  BP: (!) 111/57 (!) 148/92 (!) 158/61 (!) 165/87  Pulse: 62 61 63 66  Resp: 20 (!) 21 18 16   Temp: 98.1 F (36.7 C) 98.2 F (36.8 C) 98.1 F (36.7 C) 98.2 F (36.8 C)  TempSrc: Oral  Oral Oral Oral  SpO2: 99% 95% 93% 95%  Weight:      Height:        Wt Readings from Last 3 Encounters:  02/09/16 149 lb (67.6 kg)  02/08/16 148 lb 8 oz (67.4 kg)  01/28/16 147 lb (66.7 kg)     Intake/Output Summary (Last 24 hours) at 02/12/16 1141 Last data filed at 02/12/16 1011  Gross per 24 hour  Intake              240 ml  Output             1100 ml  Net             -  860 ml    Physical Exam:   GENERAL:Appears chronically ill HEAD, EYES, EARS, NOSE AND THROAT: Atraumatic, normocephalic.  Pupils equal and reactive to light. Sclerae anicteric. No conjunctival injection. No oro-pharyngeal erythema.  NECK: Supple. There is no jugular venous distention. No bruits, no lymphadenopathy, no thyromegaly.  HEART: Regular rate and rhythm,. No murmurs, no rubs, no clicks.  LUNGS: Rhonchus breath sounds bilaterally without any rales or wheezing ABDOMEN: Soft, flat, nontender, nondistended. Has good bowel sounds. No hepatosplenomegaly appreciated.  EXTREMITIES: No evidence of any cyanosis, clubbing, or peripheral edema.  +2 pedal and radial pulses bilaterally.  NEUROLOGIC:Cranial nerves II-12 grossly intact, strength 5 over 5 all 4 extremities reflexes 2+ SKIN: Moist and warm with no rashes appreciated.  Psych: Not anxious LN: No inguinal LN enlargement    Antibiotics   Anti-infectives    Start     Dose/Rate Route Frequency Ordered Stop   02/12/16 1000  levofloxacin (LEVAQUIN) tablet 750 mg     750 mg Oral Daily 02/11/16 1121     02/11/16 1200  levofloxacin (LEVAQUIN) tablet 750 mg  Status:  Discontinued     750 mg Oral Daily 02/11/16 1040 02/11/16 1121   02/10/16 2000  levofloxacin (LEVAQUIN) IVPB 750 mg  Status:  Discontinued     750 mg 100 mL/hr over 90 Minutes Intravenous Daily-1800 02/09/16 1846 02/10/16 1101   02/10/16 1200  levofloxacin (LEVAQUIN) tablet 750 mg  Status:  Discontinued     750 mg Oral Daily 02/10/16 1101 02/10/16 1112   02/10/16 1200  levofloxacin (LEVAQUIN)  IVPB 750 mg  Status:  Discontinued     750 mg 100 mL/hr over 90 Minutes Intravenous Daily 02/10/16 1112 02/11/16 1040      Medications   Scheduled Meds: .  stroke: mapping our early stages of recovery book   Does not apply Once  . amLODipine  5 mg Oral Daily  . aspirin EC  325 mg Oral Daily  . atorvastatin  40 mg Oral q1800  . enoxaparin (LOVENOX) injection  40 mg Subcutaneous Q24H  . finasteride  5 mg Oral Daily  . gabapentin  100 mg Oral TID  . levofloxacin  750 mg Oral Daily  . modafinil  100 mg Oral Daily   Continuous Infusions: PRN Meds:.   Data Review:   Micro Results No results found for this or any previous visit (from the past 240 hour(s)).  Radiology Reports Dg Chest 2 View  Result Date: 02/09/2016 CLINICAL DATA:  Right-sided weakness and facial droop EXAM: CHEST  2 VIEW COMPARISON:  02/08/2016 FINDINGS: Valvular prosthesis again visualized. Bilateral calcified pleural plaques. Bilateral calcified nodules unchanged. No change in small bilateral pleural effusions. Stable mild cardiomegaly with mild central congestion. No pneumothorax. IMPRESSION: 1. No significant interval change in small bilateral pleural effusions and mild left basilar opacity. 2. Stable mild cardiomegaly.  There is mild central congestion. 3. Calcified pleural plaques and small calcified lung nodules as before. Electronically Signed   By: Donavan Foil M.D.   On: 02/09/2016 20:44   Dg Chest 2 View  Result Date: 02/08/2016 CLINICAL DATA:  Shortness of breath, history of pneumonia EXAM: CHEST  2 VIEW COMPARISON:  01/11/2016, 01/10/2016, 02/04/2014 FINDINGS: There are small bilateral pleural effusions, unchanged on the right and decreased on the left. Overall improved aeration of the left lung base with residual airspace opacity noted. Stable cardiomediastinal silhouette with stent. Bilateral nodular opacities and calcified pleural plaques are grossly unchanged. Atherosclerosis. No pneumothorax.  IMPRESSION: 1. Small  right-sided pleural effusion, unchanged. Small left-sided pleural effusion, decreased compared to prior. 2. Mild residual left basilar opacity, atelectasis versus infiltrate. 3. Otherwise no significant interval changes. Electronically Signed   By: Donavan Foil M.D.   On: 02/08/2016 22:49   Mr Brain Wo Contrast  Result Date: 02/11/2016 CLINICAL DATA:  Right-sided weakness and facial droop with slurred speech. EXAM: MRI HEAD WITHOUT CONTRAST MRA HEAD WITHOUT CONTRAST TECHNIQUE: Multiplanar, multiecho pulse sequences of the brain and surrounding structures were obtained without intravenous contrast. Angiographic images of the head were obtained using MRA technique without contrast. COMPARISON:  Head CT 02/09/2016 FINDINGS: MRI HEAD FINDINGS Brain: As seen on recent head CT, there is an acute left MCA territory infarct. This involves the left frontal lobe (predominantly operculum), insula, portions of the caudate and lentiform nuclei, and corona radiata. There is no evidence of associated hemorrhage. There is cytotoxic edema without significant mass effect. No mass, midline shift, or extra-axial fluid collection is present. There is a chronic microhemorrhage in the superior cerebellar vermis right of midline. Small foci of white matter T2 hyperintensity scattered throughout both cerebral hemispheres are nonspecific but compatible with mild chronic small vessel ischemic disease. There is a chronic lacunar infarct in the white matter lateral to the left thalamus. Vascular: Abnormal FLAIR signal near the left MCA bifurcation is more fully evaluated below. The right vertebral artery is dominant. Skull and upper cervical spine: No focal marrow lesion. Sinuses/Orbits: Prior bilateral cataract extraction. Paranasal sinuses and mastoid air cells are clear. Other: None. MRA HEAD FINDINGS There is mild motion artifact. The visualized distal right vertebral artery is patent and dominant. There is poor  flow related enhancement in the left V4 segment which may be partly technical in nature due to the vessel's somewhat small size and mild motion artifact as well as due to underlying stenosis, not well evaluated. PICA origins are grossly patent. Basilar artery is patent without stenosis. SCA origins are patent. There is a patent right posterior communicating artery. PCAs are patent without evidence of significant proximal stenosis. Internal carotid arteries are patent from skullbase to carotid termini without evidence of significant stenosis. M1 segments are widely patent bilaterally. There is a proximal left M2 occlusion corresponding to the acute infarct. ACAs are patent without evidence of significant stenosis. Mild right A1 segment irregularity is attributed to motion artifact. No intracranial aneurysm is identified. IMPRESSION: 1. Acute left MCA infarct predominantly involving the frontal operculum and basal ganglia. 2. Proximal left M2 MCA branch occlusion. Electronically Signed   By: Logan Bores M.D.   On: 02/11/2016 15:00   US Carotid Bilateral (at [facility] And [facility] Only)  Result Date: 02/10/2016 CLINICAL DATA:  CVA.  History of hypertension and hyperlipidemia. EXAM: BILATERAL CAROTID DUPLEX ULTRASOUND TECHNIQUE: Pearline Cables scale imaging, color Doppler and duplex ultrasound were performed of bilateral carotid and vertebral arteries in the neck. COMPARISON:  None. FINDINGS: Criteria: Quantification of carotid stenosis is based on velocity parameters that correlate the residual internal carotid diameter with NASCET-based stenosis levels, using the diameter of the distal internal carotid lumen as the denominator for stenosis measurement. The following velocity measurements were obtained: RIGHT ICA:  101/21 cm/sec CCA:  [id number] cm/sec SYSTOLIC ICA/CCA RATIO:  1.1 DIASTOLIC ICA/CCA RATIO:  1.6 ECA:  125 cm/sec LEFT ICA:  113/23 cm/sec CCA:  [id number] cm/sec SYSTOLIC ICA/CCA RATIO:  1.2 DIASTOLIC ICA/CCA RATIO:  1.6 ECA:  125  cm/sec RIGHT CAROTID ARTERY: There is a minimal amount of eccentric mixed echogenic plaque within the  right carotid bulb (images 12 and 13), extending to involve the origin proximal aspect the right internal carotid artery (image 20), not resulting in elevated peak systolic velocities within the interrogated course the right internal carotid artery to suggest a hemodynamically significant stenosis. RIGHT VERTEBRAL ARTERY:  Antegrade Flow LEFT CAROTID ARTERY: There is a minimal amount of focal eccentric echogenic plaque involving the proximal (images 31 and 32) and mid (images 35 and 36) aspects of the left common carotid artery. There is a minimal to moderate amount of eccentric mixed echogenic plaque within the left carotid bulb (images 44 and 45). There is a moderate to large amount of eccentric mixed echogenic plaque involving the origin and proximal aspects of the left internal carotid artery (image 53), not resulting in elevated peak systolic velocities within the interrogated course of the left internal carotid artery to suggest a hemodynamically significant stenosis. LEFT VERTEBRAL ARTERY:  Antegrade Flow IMPRESSION: 1. Moderate to large amount of left-sided atherosclerotic plaque, not resulting in hemodynamically significant stenosis. 2. Minimal amount of right-sided atherosclerotic plaque, not resulting in a hemodynamically significant stenosis. Electronically Signed   By: Sandi Mariscal M.D.   On: 02/10/2016 10:37   Mr Jodene Nam Head/brain [id number] Cm  Result Date: 02/11/2016 CLINICAL DATA:  Right-sided weakness and facial droop with slurred speech. EXAM: MRI HEAD WITHOUT CONTRAST MRA HEAD WITHOUT CONTRAST TECHNIQUE: Multiplanar, multiecho pulse sequences of the brain and surrounding structures were obtained without intravenous contrast. Angiographic images of the head were obtained using MRA technique without contrast. COMPARISON:  Head CT 02/09/2016 FINDINGS: MRI HEAD FINDINGS Brain: As seen on recent head CT,  there is an acute left MCA territory infarct. This involves the left frontal lobe (predominantly operculum), insula, portions of the caudate and lentiform nuclei, and corona radiata. There is no evidence of associated hemorrhage. There is cytotoxic edema without significant mass effect. No mass, midline shift, or extra-axial fluid collection is present. There is a chronic microhemorrhage in the superior cerebellar vermis right of midline. Small foci of white matter T2 hyperintensity scattered throughout both cerebral hemispheres are nonspecific but compatible with mild chronic small vessel ischemic disease. There is a chronic lacunar infarct in the white matter lateral to the left thalamus. Vascular: Abnormal FLAIR signal near the left MCA bifurcation is more fully evaluated below. The right vertebral artery is dominant. Skull and upper cervical spine: No focal marrow lesion. Sinuses/Orbits: Prior bilateral cataract extraction. Paranasal sinuses and mastoid air cells are clear. Other: None. MRA HEAD FINDINGS There is mild motion artifact. The visualized distal right vertebral artery is patent and dominant. There is poor flow related enhancement in the left V4 segment which may be partly technical in nature due to the vessel's somewhat small size and mild motion artifact as well as due to underlying stenosis, not well evaluated. PICA origins are grossly patent. Basilar artery is patent without stenosis. SCA origins are patent. There is a patent right posterior communicating artery. PCAs are patent without evidence of significant proximal stenosis. Internal carotid arteries are patent from skullbase to carotid termini without evidence of significant stenosis. M1 segments are widely patent bilaterally. There is a proximal left M2 occlusion corresponding to the acute infarct. ACAs are patent without evidence of significant stenosis. Mild right A1 segment irregularity is attributed to motion artifact. No intracranial  aneurysm is identified. IMPRESSION: 1. Acute left MCA infarct predominantly involving the frontal operculum and basal ganglia. 2. Proximal left M2 MCA branch occlusion. Electronically Signed   By: Logan Bores  M.D.   On: 02/11/2016 15:00   Ct Head Code Stroke W/o Cm  Result Date: 02/09/2016 CLINICAL DATA:  Code stroke. 82 year old male with confusion right side weakness facial droop and slurred speech. Initial encounter. EXAM: CT HEAD WITHOUT CONTRAST TECHNIQUE: Contiguous axial images were obtained from the base of the skull through the vertex without intravenous contrast. COMPARISON:  None. FINDINGS: Brain: Positive for cytotoxic edema in the left MCA territory at the frontal operculum and involving a small portion of the anterior left insula. No associated hemorrhage or mass effect. Lentiform nuclei and internal capsule appear spared. Posterior insula and operculum are spared. No temporal lobe involvement is evident. No superior left MCA territory involvement. Normal gray-white matter differentiation in the right hemisphere and posterior fossa. No ventriculomegaly. Patent basilar cisterns. No intracranial mass effect. No acute intracranial hemorrhage identified. Vascular: Calcified atherosclerosis at the skull base. Positive for abnormal hyperdensity at the distal left MCA M1 segment and left MCA bifurcation (series 6, image 43). Skull: No acute osseous abnormality identified. Sinuses/Orbits: Clear. Other: No acute orbit or scalp soft tissue findings. ASPECTS [facility] Stroke Program Early CT Score) - Ganglionic level infarction (caudate, lentiform nuclei, internal capsule, insula, M1-M3 cortex): 5 (minus 2 for the insula and M1 area). - Supraganglionic infarction (M4-M6 cortex): 3 Total score (0-10 with 10 being normal): 8 IMPRESSION: 1. Positive for a left MCA infarct with operculum cytotoxic edema, no associated hemorrhage. Positive hyperdense distal Left M1 suggesting Emergent Large Vessel Occlusion. 2.  ASPECTS is 8. 3. Study discussed by telephone with Dr. Meade Maw on 02/09/2016 at 13:13 . Electronically Signed   By: Genevie Ann M.D.   On: 02/09/2016 13:14     CBC  Recent Labs Lab 02/08/16 1327 02/09/16 1305  WBC 8.3 7.6  HGB 11.6* 12.1*  HCT 34.5* 34.5*  PLT 187.0 175  MCV 89.7 88.9  MCH  --  31.1  MCHC 33.7 35.0  RDW 14.5 14.3  LYMPHSABS 0.9 0.7*  MONOABS 0.5 0.6  EOSABS 0.0 0.0  BASOSABS 0.0 0.0    Chemistries   Recent Labs Lab 02/08/16 1327 02/09/16 1305  NA 145 143  K 4.2 3.8  CL 108 110  CO2 31 27  GLUCOSE 124* 101*  BUN 29* 29*  CREATININE 1.04 0.98  CALCIUM 9.1 8.7*  AST 11 22  ALT 14 16*  ALKPHOS 65 64  BILITOT 1.0 1.2   ------------------------------------------------------------------------------------------------------------------ estimated creatinine clearance is 51.7 mL/min (by C-G formula based on SCr of 0.98 mg/dL). ------------------------------------------------------------------------------------------------------------------  Recent Labs  02/10/16 0522  HGBA1C 5.5   ------------------------------------------------------------------------------------------------------------------  Recent Labs  02/10/16 0522  CHOL 131  HDL 49  LDLCALC 72  TRIG 52  CHOLHDL 2.7   ------------------------------------------------------------------------------------------------------------------  Recent Labs  02/10/16 0522  TSH 0.873   ------------------------------------------------------------------------------------------------------------------ No results for input(s): VITAMINB12, FOLATE, FERRITIN, TIBC, IRON, RETICCTPCT in the last 72 hours.  Coagulation profile  Recent Labs Lab 02/09/16 1305  INR 1.24    No results for input(s): DDIMER in the last 72 hours.  Cardiac Enzymes  Recent Labs Lab 02/09/16 1700 02/09/16 2319 02/10/16 0522  TROPONINI 0.05* 0.04* 0.05*    ------------------------------------------------------------------------------------------------------------------ Invalid input(s): POCBNP    Assessment & Plan   Gibril Erkkila  is a 82 y.o. male with a known history of Congestive heart failure, hypertension, hyperlipidemia and multiple other medical problems is brought into the ED for right-sided weakness and facial droop with slurry speech. According to the family members patient's symptoms started at around 60 -30  am patient is brought into the ED CT head has revealed left MCA infarct  #Acute CVA-left MCA infarct on CT head Mri confirms cva Continue antiplatelets therapyDue to patient having the episode of decrease in responsiveness I will monitor 1 more day likely to skilled nursing facility tomorrow  Continuous therapy atorvastatin Pt with little lethargy start provgil  #Aspiration Pneumonia Continue therapy with Levaquin Modified diet   #. Essential hypertension Blood pressure stableContinue  amlodipine   #Benign prostatic hypertrophy  Proscar    #Miscellaneous Lovenox for DVT prophylaxis  All the records are reviewed and case discussed with ED provider.   CODE STATUS: Full code prognosis very poor     Code Status Orders        Start     Ordered   02/09/16 1826  Full code  Continuous     02/09/16 1825    Code Status History    Date Active Date Inactive Code Status Order ID Comments User Context   01/05/2016 10:39 PM 01/12/2016  9:25 PM Full Code [id number]  Demetrios Loll, MD Inpatient   02/03/2014  2:18 PM 02/06/2014  5:53 PM Full Code [id number]  Sherren Mocha, MD Inpatient           Consults  neurology  DVT Prophylaxis  Lovenox    Lab Results  Component Value Date   PLT 175 02/09/2016     Time Spent in minutes   51min  Greater than 50% of time spent in care coordination and counseling patient regarding the condition and plan of care.   Dustin Flock M.D on 02/12/2016 at 11:41  AM  Between 7am to 6pm - Pager - [phone]  After 6pm go to [email or web address] - password [email or web address] [facility]   Office  [phone]

## 2016-02-12 NOTE — Progress Notes (Signed)
Occupational Therapy Treatment Patient Details Name: Douglas Nick Formanek Sr. MRN: [id number] DOB: 1927-06-30 Today's Date: 02/12/2016    History of present illness Pt. is an 82 y.o. male who was admitted to [facility] with right sided weakness   OT comments  Pt. Is more alert today. Pt. Is engaging his RUE and hand more during self-grooming tasks. Pt. Is improving RUE strength, and coordination. Right grip strength is 40#, Left is 40#. Pt. continues to benefit from skilled OT services for ADL training, neuromuscular re-ed, UE there. Ex., functional mobility for ADLs, and Pt./family education. Pt. Continues to be appropriate for SNF level of care.   Follow Up Recommendations  SNF    Equipment Recommendations       Recommendations for Other Services PT consult    Precautions / Restrictions Precautions Precautions: Fall Restrictions Weight Bearing Restrictions: No                                             ADL       Grooming: Minimal assistance                                        Vision                     Perception     Praxis      Cognition   Behavior During Therapy: WFL for tasks assessed/performed Overall Cognitive Status: Difficult to assess                       Extremity/Trunk Assessment               Exercises     Shoulder Instructions       General Comments      Pertinent Vitals/ Pain       Pain Assessment: No/denies pain  Home Living                                          Prior Functioning/Environment              Frequency  Min 1X/week        Progress Toward Goals  OT Goals(current goals can now be found in the care plan section)  Progress towards OT goals: Progressing toward goals  Acute Rehab OT Goals Patient Stated Goal: To get better OT Goal Formulation: With patient Potential to Achieve Goals: Good  Plan Discharge plan remains appropriate     Co-evaluation                 End of Session     Activity Tolerance Patient tolerated treatment well   Patient Left in bed;with call bell/phone within reach;with bed alarm set   Nurse Communication          Time: 1000-1010 OT Time Calculation (min): 10 min  Charges: OT General Charges $OT Visit: 1 Procedure OT Treatments $Self Care/Home Management : 8-22 mins  Harrel Carina, MS, OTR/L 02/12/2016, 10:56 AM

## 2016-02-12 NOTE — Plan of Care (Signed)
Problem: SLP Dysphagia Goals Goal: Misc Dysphagia Goal Pt will safely tolerate po diet of least restrictive consistency w/ no overt s/s of aspiration noted by Staff/pt/family x3 sessions.    

## 2016-02-12 NOTE — Progress Notes (Signed)
Physical Therapy Treatment Patient Details Name: Douglas Rende Shanks Sr. MRN: [id number] DOB: 1928-02-24 Today's Date: 02/12/2016    History of Present Illness Pt. is an 82 y.o. male who was admitted to [facility] with right sided weakness    PT Comments    Pt on commode with nursing upon arrival.  Assisted with transfer and care.  Pt required +2 assist for mobility skills today with use of walker for transfers to/from commode, bed and to set up in recliner upon leaving room. Gait with poor quality and overall unsteadiness and high fall risk.  Pt required increased assistance with mobility today compared to evaluation yesterday.  Discussed with primary nurse. Participated in exercises as described below.     Follow Up Recommendations  SNF     Equipment Recommendations       Recommendations for Other Services       Precautions / Restrictions Precautions Precautions: Fall Restrictions Weight Bearing Restrictions: No    Mobility  Bed Mobility               General bed mobility comments: on commode upon arrival  Transfers Overall transfer level: Needs assistance Equipment used: Rolling walker (2 wheeled) Transfers: Sit to/from Stand Sit to Stand: Mod assist;+2 physical assistance         General transfer comment: R posterior/lateral lean, broad BOS; requires UE support thorughout  Ambulation/Gait Ambulation/Gait assistance: Mod assist;+2 physical assistance Ambulation Distance (Feet): 2 Feet Assistive device: Rolling walker (2 wheeled) Gait Pattern/deviations: Step-to pattern;Shuffle   Gait velocity interpretation: <1.8 ft/sec, indicative of risk for recurrent falls General Gait Details: poor balance requiring assist +2 to prevent falls   Stairs            Wheelchair Mobility    Modified Rankin (Stroke Patients Only)       Balance Overall balance assessment: Needs assistance Sitting-balance support: Feet supported Sitting balance-Leahy Scale: Fair      Standing balance support: Bilateral upper extremity supported Standing balance-Leahy Scale: Poor                      Cognition Arousal/Alertness: Awake/alert Behavior During Therapy: WFL for tasks assessed/performed Overall Cognitive Status: Difficult to assess                      Exercises Other Exercises Other Exercises: seated arom 2 x 10 ble's LAQ, marches, ankle pumps, ab/adduction Other Exercises: toilet transfers    General Comments        Pertinent Vitals/Pain Pain Assessment: No/denies pain    Home Living                      Prior Function            PT Goals (current goals can now be found in the care plan section) Acute Rehab PT Goals Patient Stated Goal: To get better    Frequency    7X/week      PT Plan Current plan remains appropriate    Co-evaluation             End of Session Equipment Utilized During Treatment: Gait belt Activity Tolerance: Patient tolerated treatment well Patient left: in chair;with call bell/phone within reach;with chair alarm set     Time: 1045-1101 PT Time Calculation (min) (ACUTE ONLY): 16 min  Charges:  $Therapeutic Exercise: 8-22 mins  G Codes:      Chesley Noon 02/12/2016, 11:08 AM

## 2016-02-12 NOTE — Progress Notes (Signed)
Speech Language Pathology Treatment: Dysphagia  Patient Details Name: Douglas Parker. MRN: [id number] DOB: [birth year] Today's Date: 02/12/2016 Time: [id number] SLP Time Calculation (min) (ACUTE ONLY): 30 min  Assessment / Plan / Recommendation Clinical Impression  Pt appeared to tolerate the currently recommended diet of dysphagia 3 w/ Nectar consistency liquids via cup and straw w/ no overt s/s of aspiration noted; upon trials of thin liquids via Cup, delayed throat clearing was noted x1 - the throat clearing also followed moderate belching after drinking the trials of water(thin). Such belching was not noted w/ trials of foods and Nectar liquids - suspect pt swallows increased amount of air w/ the thinner liquids(?). Pt was able to feed himself w/ full setup assist. Oral phase min increased in bolus management time, however, adequate oral clearing noted given time. UE shakiness was noted - recommend built-up utensils or gripper for better utensil control.   Recommend continue this diet consistency at this time per MD order; suggest objective swallow assessment to determine degree of dysphagia w/ thin liquids when pt's medical status is more stable; aspiration precautions; meds in Puree. ST services will f/u w/ pt while admitted for toleration of diet; education on precautions.    HPI HPI: Pt is a 82 y.o. male with a known history of TAVR, diverticulosis, Scoliosis, Congestive heart failure, hypertension, hyperlipidemia and multiple other medical problems brought into the ED for right-sided weakness and facial droop with slurry speech. According to the family members patient's symptoms started at around 4-6 am. 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 Pt also has a h/o Pneumonia per MD; CXRs in the past 1 year were noted. Currently, pt is awake, verbally conversive. Noted tremorous UEs and oral tremors when drinking liquids. Pt answered basic questions re: self and followed basic commands. He appeared  slightly distracted and required verbal cues to reattend to tasks. Pt fed self w/ tray setup.       SLP Plan  Continue with current plan of care     Recommendations  Diet recommendations: Dysphagia 3 (mechanical soft);Nectar-thick liquid Liquids provided via: Cup;Straw (thin by cup only) Medication Administration: Whole meds with puree Supervision: Patient able to self feed;Intermittent supervision to cue for compensatory strategies (setup assist) Compensations: Minimize environmental distractions;Slow rate;Small sips/bites;Lingual sweep for clearance of pocketing;Follow solids with liquid Postural Changes and/or Swallow Maneuvers: Seated upright 90 degrees;Upright 30-60 min after meal                General recommendations:  (dietician) Oral Care Recommendations: Oral care BID;Staff/trained caregiver to provide oral care Follow up Recommendations: Skilled Nursing facility (TBD) Plan: Continue with current plan of care       Douglas Parker, Douglas Parker, CCC-SLP  Watson,Katherine 02/12/2016, 10:46 AM

## 2016-02-13 LAB — CBC
HEMATOCRIT: 33.2 % — AB (ref 40.0–52.0)
HEMOGLOBIN: 11.5 g/dL — AB (ref 13.0–18.0)
MCH: 30.9 pg (ref 26.0–34.0)
MCHC: 34.6 g/dL (ref 32.0–36.0)
MCV: 89.2 fL (ref 80.0–100.0)
Platelets: 180 10*3/uL (ref 150–440)
RBC: 3.73 MIL/uL — ABNORMAL LOW (ref 4.40–5.90)
RDW: 14 % (ref 11.5–14.5)
WBC: 5.2 10*3/uL (ref 3.8–10.6)

## 2016-02-13 LAB — CREATININE, SERUM
Creatinine, Ser: 0.96 mg/dL (ref 0.61–1.24)
GFR calc Af Amer: >60 mL/min (ref >60)
GFR calc non Af Amer: >60 mL/min (ref >60)

## 2016-02-13 MED ORDER — LEVOFLOXACIN 750 MG PO TABS: 750.0000 mg | ORAL_TABLET | Freq: Every day | ORAL | 0 refills | Status: DC

## 2016-02-13 MED ORDER — MODAFINIL 100 MG PO TABS: 100.0000 mg | ORAL_TABLET | Freq: Every day | ORAL | Status: DC

## 2016-02-13 MED ORDER — ATORVASTATIN CALCIUM 20 MG PO TABS: 40.0000 mg | ORAL_TABLET | Freq: Every day | ORAL | Status: DC

## 2016-02-13 NOTE — Progress Notes (Signed)
Physical Therapy Treatment Patient Details Name: Douglas Lastrapes Humes Sr. MRN: [id number] DOB: 01-30-1928 Today's Date: 02/13/2016    History of Present Illness Pt. is an 82 y.o. male who was admitted to [facility] with right sided weakness    PT Comments    Pt is making gradual progress towards goals and is able to ambulate to recliner only requiring 1 assist this date. Pt able to participate in there-ex this date with good effort, however does require assist for completion secondary to weakness. Appears motivated to perform therapy. Bed linen soiled with food upon arrival, aide notified to change bed while pt in recliner.  Follow Up Recommendations  SNF     Equipment Recommendations       Recommendations for Other Services       Precautions / Restrictions Precautions Precautions: Fall Restrictions Weight Bearing Restrictions: No    Mobility  Bed Mobility Overal bed mobility: Needs Assistance Bed Mobility: Supine to Sit     Supine to sit: Mod assist     General bed mobility comments: assist for initiating scooting of legs off towards EOB. Mod assist required for trunk support. Once seated at EOB, pt able to sit with supervision  Transfers Overall transfer level: Needs assistance Equipment used: Rolling walker (2 wheeled) Transfers: Sit to/from Stand Sit to Stand: Mod assist         General transfer comment: Pt able to initiate standing with cues for foot placement. Once standing, unsteadiness noted with R post lateral leaning noted. Needs min assist to maintain static balance  Ambulation/Gait Ambulation/Gait assistance: Mod assist Ambulation Distance (Feet): 2 Feet Assistive device: Rolling walker (2 wheeled) Gait Pattern/deviations: Step-to pattern;Drifts right/left     General Gait Details: poor balance during ambulation, however is able to follow commands for stepping and sequencing of RW. Secondary to fatigue, unable to further ambulate   Stairs             Wheelchair Mobility    Modified Rankin (Stroke Patients Only)       Balance                                    Cognition Arousal/Alertness: Awake/alert Behavior During Therapy: WFL for tasks assessed/performed Overall Cognitive Status: History of cognitive impairments - at baseline                      Exercises Other Exercises Other Exercises: Seated ther-ex on B LE including alt. marching, LAQ, hip abd/add, hip add squeezes, SAQ, and quad sets. All therex performed x 10 reps with cues and min assist for completion.    General Comments        Pertinent Vitals/Pain Pain Assessment: No/denies pain    Home Living                      Prior Function            PT Goals (current goals can now be found in the care plan section) Acute Rehab PT Goals Patient Stated Goal: To get better PT Goal Formulation: With patient Time For Goal Achievement: 02/25/16 Potential to Achieve Goals: Fair Progress towards PT goals: Progressing toward goals    Frequency    7X/week      PT Plan Current plan remains appropriate    Co-evaluation             End  of Session Equipment Utilized During Treatment: Gait belt Activity Tolerance: Patient tolerated treatment well Patient left: in chair;with call bell/phone within reach;with chair alarm set     Time: [id number] PT Time Calculation (min) (ACUTE ONLY): 23 min  Charges:  $Gait Training: 8-22 mins $Therapeutic Exercise: 8-22 mins                    G Codes:      Douglas Parker 02-24-16, 12:16 PM  Greggory Stallion, PT, DPT [phone]

## 2016-02-13 NOTE — Discharge Summary (Signed)
[place] [facility] [place] [facility] 82 y.o., DOB [birth year], MRN [id number]. Admission date: 02/09/2016 Discharge Date 02/13/2016 Primary MD Elsie Stain, MD Admitting Physician Nicholes Mango, MD  Admission Diagnosis  Acute ischemic stroke [facility] [place]) [I63.9]  Discharge Diagnosis   Active Problems:   Acute CVA (cerebrovascular accident) [facility])   Aspiration pneumonia   Essential hypertension   BPH   Severe pulmonary hypertension based on echo   Moderate mitral regurg       Hospital Course Douglas Parker  is a 82 y.o. male with a known history of Congestive heart failure, hypertension, hyperlipidemia and multiple other medical problems is brought into the ED for right-sided weakness and facial droop with slurry speech. He was brought to the ED and was noted to have an acute CVA. Patient was admitted for further evaluation and therapy for this. He underwent MRI of the brain which confirmed a left MCA stroke. He did have some intracranial vessel obstruction that is not amendable to any surgical intervention. Patient also was noted to have a pneumonia similar to his previous admission one month ago. Therefore aspiration was considered. He was treated with antibiotics. His diet has been modified. He is very weak and deconditioned in need of rehabilitation. He was also  very sleepy during the day therefore is started on [place] gel that has seems to have helped him.            Consults  None  Significant Tests:  See full reports for all details     Dg Chest 2 View  Result Date: 02/09/2016 CLINICAL DATA:  Right-sided weakness and facial droop EXAM: CHEST  2 VIEW COMPARISON:  02/08/2016 FINDINGS: Valvular prosthesis again visualized. Bilateral calcified pleural plaques. Bilateral calcified nodules unchanged. No change in small bilateral pleural effusions. Stable mild cardiomegaly with mild central congestion. No pneumothorax. IMPRESSION: 1. No significant  interval change in small bilateral pleural effusions and mild left basilar opacity. 2. Stable mild cardiomegaly.  There is mild central congestion. 3. Calcified pleural plaques and small calcified lung nodules as before. Electronically Signed   By: Donavan Foil M.D.   On: 02/09/2016 20:44   Dg Chest 2 View  Result Date: 02/08/2016 CLINICAL DATA:  Shortness of breath, history of pneumonia EXAM: CHEST  2 VIEW COMPARISON:  01/11/2016, 01/10/2016, 02/04/2014 FINDINGS: There are small bilateral pleural effusions, unchanged on the right and decreased on the left. Overall improved aeration of the left lung base with residual airspace opacity noted. Stable cardiomediastinal silhouette with stent. Bilateral nodular opacities and calcified pleural plaques are grossly unchanged. Atherosclerosis. No pneumothorax. IMPRESSION: 1. Small right-sided pleural effusion, unchanged. Small left-sided pleural effusion, decreased compared to prior. 2. Mild residual left basilar opacity, atelectasis versus infiltrate. 3. Otherwise no significant interval changes. Electronically Signed   By: Donavan Foil M.D.   On: 02/08/2016 22:49   Mr Brain Wo Contrast  Result Date: 02/11/2016 CLINICAL DATA:  Right-sided weakness and facial droop with slurred speech. EXAM: MRI HEAD WITHOUT CONTRAST MRA HEAD WITHOUT CONTRAST TECHNIQUE: Multiplanar, multiecho pulse sequences of the brain and surrounding structures were obtained without intravenous contrast. Angiographic images of the head were obtained using MRA technique without contrast. COMPARISON:  Head CT 02/09/2016 FINDINGS: MRI HEAD FINDINGS Brain: As seen on recent head CT, there is an acute left MCA territory infarct. This involves the left frontal lobe (predominantly operculum), insula, portions of the caudate and lentiform nuclei, and corona radiata. There is no evidence of associated  hemorrhage. There is cytotoxic edema without significant mass effect. No mass, midline shift, or  extra-axial fluid collection is present. There is a chronic microhemorrhage in the superior cerebellar vermis right of midline. Small foci of white matter T2 hyperintensity scattered throughout both cerebral hemispheres are nonspecific but compatible with mild chronic small vessel ischemic disease. There is a chronic lacunar infarct in the white matter lateral to the left thalamus. Vascular: Abnormal FLAIR signal near the left MCA bifurcation is more fully evaluated below. The right vertebral artery is dominant. Skull and upper cervical spine: No focal marrow lesion. Sinuses/Orbits: Prior bilateral cataract extraction. Paranasal sinuses and mastoid air cells are clear. Other: None. MRA HEAD FINDINGS There is mild motion artifact. The visualized distal right vertebral artery is patent and dominant. There is poor flow related enhancement in the left V4 segment which may be partly technical in nature due to the vessel's somewhat small size and mild motion artifact as well as due to underlying stenosis, not well evaluated. PICA origins are grossly patent. Basilar artery is patent without stenosis. SCA origins are patent. There is a patent right posterior communicating artery. PCAs are patent without evidence of significant proximal stenosis. Internal carotid arteries are patent from skullbase to carotid termini without evidence of significant stenosis. M1 segments are widely patent bilaterally. There is a proximal left M2 occlusion corresponding to the acute infarct. ACAs are patent without evidence of significant stenosis. Mild right A1 segment irregularity is attributed to motion artifact. No intracranial aneurysm is identified. IMPRESSION: 1. Acute left MCA infarct predominantly involving the frontal operculum and basal ganglia. 2. Proximal left M2 MCA branch occlusion. Electronically Signed   By: Logan Bores M.D.   On: 02/11/2016 15:00   US Carotid Bilateral (at [facility] Only)  Result Date:  02/10/2016 CLINICAL DATA:  CVA.  History of hypertension and hyperlipidemia. EXAM: BILATERAL CAROTID DUPLEX ULTRASOUND TECHNIQUE: Pearline Cables scale imaging, color Doppler and duplex ultrasound were performed of bilateral carotid and vertebral arteries in the neck. COMPARISON:  None. FINDINGS: Criteria: Quantification of carotid stenosis is based on velocity parameters that correlate the residual internal carotid diameter with NASCET-based stenosis levels, using the diameter of the distal internal carotid lumen as the denominator for stenosis measurement. The following velocity measurements were obtained: RIGHT ICA:  101/21 cm/sec CCA:  [id number] cm/sec SYSTOLIC ICA/CCA RATIO:  1.1 DIASTOLIC ICA/CCA RATIO:  1.6 ECA:  125 cm/sec LEFT ICA:  113/23 cm/sec CCA:  [id number] cm/sec SYSTOLIC ICA/CCA RATIO:  1.2 DIASTOLIC ICA/CCA RATIO:  1.6 ECA:  125 cm/sec RIGHT CAROTID ARTERY: There is a minimal amount of eccentric mixed echogenic plaque within the right carotid bulb (images 12 and 13), extending to involve the origin proximal aspect the right internal carotid artery (image 20), not resulting in elevated peak systolic velocities within the interrogated course the right internal carotid artery to suggest a hemodynamically significant stenosis. RIGHT VERTEBRAL ARTERY:  Antegrade Flow LEFT CAROTID ARTERY: There is a minimal amount of focal eccentric echogenic plaque involving the proximal (images 31 and 32) and mid (images 35 and 36) aspects of the left common carotid artery. There is a minimal to moderate amount of eccentric mixed echogenic plaque within the left carotid bulb (images 44 and 45). There is a moderate to large amount of eccentric mixed echogenic plaque involving the origin and proximal aspects of the left internal carotid artery (image 53), not resulting in elevated peak systolic velocities within the interrogated course of the left internal carotid artery to suggest  a hemodynamically significant stenosis. LEFT VERTEBRAL  ARTERY:  Antegrade Flow IMPRESSION: 1. Moderate to large amount of left-sided atherosclerotic plaque, not resulting in hemodynamically significant stenosis. 2. Minimal amount of right-sided atherosclerotic plaque, not resulting in a hemodynamically significant stenosis. Electronically Signed   By: Sandi Mariscal M.D.   On: 02/10/2016 10:37   Mr Jodene Nam Head/brain [id number] Cm  Result Date: 02/11/2016 CLINICAL DATA:  Right-sided weakness and facial droop with slurred speech. EXAM: MRI HEAD WITHOUT CONTRAST MRA HEAD WITHOUT CONTRAST TECHNIQUE: Multiplanar, multiecho pulse sequences of the brain and surrounding structures were obtained without intravenous contrast. Angiographic images of the head were obtained using MRA technique without contrast. COMPARISON:  Head CT 02/09/2016 FINDINGS: MRI HEAD FINDINGS Brain: As seen on recent head CT, there is an acute left MCA territory infarct. This involves the left frontal lobe (predominantly operculum), insula, portions of the caudate and lentiform nuclei, and corona radiata. There is no evidence of associated hemorrhage. There is cytotoxic edema without significant mass effect. No mass, midline shift, or extra-axial fluid collection is present. There is a chronic microhemorrhage in the superior cerebellar vermis right of midline. Small foci of white matter T2 hyperintensity scattered throughout both cerebral hemispheres are nonspecific but compatible with mild chronic small vessel ischemic disease. There is a chronic lacunar infarct in the white matter lateral to the left thalamus. Vascular: Abnormal FLAIR signal near the left MCA bifurcation is more fully evaluated below. The right vertebral artery is dominant. Skull and upper cervical spine: No focal marrow lesion. Sinuses/Orbits: Prior bilateral cataract extraction. Paranasal sinuses and mastoid air cells are clear. Other: None. MRA HEAD FINDINGS There is mild motion artifact. The visualized distal right vertebral artery is patent  and dominant. There is poor flow related enhancement in the left V4 segment which may be partly technical in nature due to the vessel's somewhat small size and mild motion artifact as well as due to underlying stenosis, not well evaluated. PICA origins are grossly patent. Basilar artery is patent without stenosis. SCA origins are patent. There is a patent right posterior communicating artery. PCAs are patent without evidence of significant proximal stenosis. Internal carotid arteries are patent from skullbase to carotid termini without evidence of significant stenosis. M1 segments are widely patent bilaterally. There is a proximal left M2 occlusion corresponding to the acute infarct. ACAs are patent without evidence of significant stenosis. Mild right A1 segment irregularity is attributed to motion artifact. No intracranial aneurysm is identified. IMPRESSION: 1. Acute left MCA infarct predominantly involving the frontal operculum and basal ganglia. 2. Proximal left M2 MCA branch occlusion. Electronically Signed   By: Logan Bores M.D.   On: 02/11/2016 15:00   Ct Head Code Stroke W/o Cm  Result Date: 02/09/2016 CLINICAL DATA:  Code stroke. 82 year old male with confusion right side weakness facial droop and slurred speech. Initial encounter. EXAM: CT HEAD WITHOUT CONTRAST TECHNIQUE: Contiguous axial images were obtained from the base of the skull through the vertex without intravenous contrast. COMPARISON:  None. FINDINGS: Brain: Positive for cytotoxic edema in the left MCA territory at the frontal operculum and involving a small portion of the anterior left insula. No associated hemorrhage or mass effect. Lentiform nuclei and internal capsule appear spared. Posterior insula and operculum are spared. No temporal lobe involvement is evident. No superior left MCA territory involvement. Normal gray-white matter differentiation in the right hemisphere and posterior fossa. No ventriculomegaly. Patent basilar  cisterns. No intracranial mass effect. No acute intracranial hemorrhage identified. Vascular: Calcified atherosclerosis  at the skull base. Positive for abnormal hyperdensity at the distal left MCA M1 segment and left MCA bifurcation (series 6, image 43). Skull: No acute osseous abnormality identified. Sinuses/Orbits: Clear. Other: No acute orbit or scalp soft tissue findings. ASPECTS [facility] Stroke Program Early CT Score) - Ganglionic level infarction (caudate, lentiform nuclei, internal capsule, insula, M1-M3 cortex): 5 (minus 2 for the insula and M1 area). - Supraganglionic infarction (M4-M6 cortex): 3 Total score (0-10 with 10 being normal): 8 IMPRESSION: 1. Positive for a left MCA infarct with operculum cytotoxic edema, no associated hemorrhage. Positive hyperdense distal Left M1 suggesting Emergent Large Vessel Occlusion. 2. ASPECTS is 8. 3. Study discussed by telephone with Dr. Meade Maw on 02/09/2016 at 13:13 . Electronically Signed   By: Genevie Ann M.D.   On: 02/09/2016 13:14       Today   Subjective:   Douglas Parker patient denies any complaints continues to remain awake  Objective:   Blood pressure (!) 128/52, pulse 67, temperature 98 F (36.7 C), temperature source Oral, resp. rate 18, height 5\' 9"  (1.753 m), weight 149 lb (67.6 kg), SpO2 95 %.  .  Intake/Output Summary (Last 24 hours) at 02/13/16 1119 Last data filed at 02/13/16 1028  Gross per 24 hour  Intake              240 ml  Output              900 ml  Net             -660 ml    Exam VITAL SIGNS: Blood pressure (!) 128/52, pulse 67, temperature 98 F (36.7 C), temperature source Oral, resp. rate 18, height 5\' 9"  (1.753 m), weight 149 lb (67.6 kg), SpO2 95 %.  GENERAL:  82 y.o.-year-old patient lying in the bed with no acute distress.  EYES: Pupils equal, round, reactive to light and accommodation. No scleral icterus. Extraocular muscles intact.  HEENT: Head atraumatic, normocephalic. Oropharynx and nasopharynx  clear.  NECK:  Supple, no jugular venous distention. No thyroid enlargement, no tenderness.  LUNGS: Normal breath sounds bilaterally, no wheezing, rales,rhonchi or crepitation. No use of accessory muscles of respiration.  CARDIOVASCULAR: S1, S2 normal. No murmurs, rubs, or gallops.  ABDOMEN: Soft, nontender, nondistended. Bowel sounds present. No organomegaly or mass.  EXTREMITIES: No pedal edema, cyanosis, or clubbing.  NEUROLOGIC: Cranial nerves II through XII are intact. Muscle strength 5/5 in all extremities. Sensation intact. Gait not checked.  PSYCHIATRIC: The patient is alert and oriented x 3.  SKIN: No obvious rash, lesion, or ulcer.   Data Review     CBC w Diff:  Lab Results  Component Value Date   WBC 5.2 02/13/2016   HGB 11.5 (L) 02/13/2016   HCT 33.2 (L) 02/13/2016   PLT 180 02/13/2016   LYMPHOPCT 10 02/09/2016   MONOPCT 8 02/09/2016   EOSPCT 1 02/09/2016   BASOPCT 0 02/09/2016   CMP:  Lab Results  Component Value Date   NA 143 02/09/2016   NA 138 02/16/2014   NA 142 12/18/2013   K 3.8 02/09/2016   K 4.4 12/18/2013   CL 110 02/09/2016   CL 105 12/18/2013   CO2 27 02/09/2016   CO2 34 (H) 12/18/2013   BUN 29 (H) 02/09/2016   BUN 18 02/16/2014   BUN 23 (H) 12/18/2013   CREATININE 0.96 02/13/2016   CREATININE 0.91 01/19/2014   GLU 77 02/16/2014   PROT 6.5 02/09/2016   ALBUMIN 3.7 02/09/2016  BILITOT 1.2 02/09/2016   ALKPHOS 64 02/09/2016   AST 22 02/09/2016   ALT 16 (L) 02/09/2016  .  Micro Results No results found for this or any previous visit (from the past 240 hour(s)).      Code Status Orders        Start     Ordered   02/09/16 1826  Full code  Continuous     02/09/16 1825    Code Status History    Date Active Date Inactive Code Status Order ID Comments User Context   01/05/2016 10:39 PM 01/12/2016  9:25 PM Full Code [id number]  Demetrios Loll, MD Inpatient   02/03/2014  2:18 PM 02/06/2014  5:53 PM Full Code [id number]  Sherren Mocha, MD  Inpatient           Contact information for follow-up providers    Elsie Stain, MD Follow up in 2 week(s).   Specialty:  Family Medicine Contact information: [place] [address] [phone]            Contact information for after-discharge care    Destination    [facility] Follow up.   Specialty:  [name] information: [address] [place] [phone]                  Discharge Medications     Medication List    TAKE these medications   amLODipine 5 MG tablet Commonly known as:  NORVASC Take 1 tablet (5 mg total) by mouth daily.   aspirin 81 MG EC tablet Take 81 mg by mouth daily.   atorvastatin 20 MG tablet Commonly known as:  LIPITOR Take 2 tablets (40 mg total) by mouth daily. What changed:  how much to take   finasteride 5 MG tablet Commonly known as:  PROSCAR Take 1 tablet (5 mg total) by mouth daily.   furosemide 20 MG tablet Commonly known as:  LASIX Take 1 tablet (20 mg total) by mouth daily.   gabapentin 100 MG capsule Commonly known as:  NEURONTIN Take 1 capsule (100 mg total) by mouth 3 (three) times daily. For burning pain   levofloxacin 750 MG tablet Commonly known as:  LEVAQUIN Take 1 tablet (750 mg total) by mouth daily.   modafinil 100 MG tablet Commonly known as:  PROVIGIL Take 1 tablet (100 mg total) by mouth daily. Start taking on:  02/14/2016          Total Time in preparing paper work, data evaluation and todays exam - 35 minutes  Dustin Flock M.D on 02/13/2016 at 11:19 AM  [place]  [phone]

## 2016-02-13 NOTE — Progress Notes (Signed)
Pt is being discharged to [facility]. Report given to [place], Therapist, sports. Discharge packet in pt's slot. Awaiting EMS.

## 2016-02-13 NOTE — Clinical Social Work Note (Signed)
Patient to dc to [facility] via non-emergent EMS. The facility and family (son [name]) are aware. CSW will con't to follow pending additional dc needs.  Santiago Bumpers, MSW, LCSW-A [phone]

## 2016-02-13 NOTE — Discharge Instructions (Signed)
[place]:  Dysphagia 3 with nector thick liquid  DISCHARGE CONDITION:  Stable  ACTIVITY:  Activity as tolerated  OXYGEN:  Home Oxygen: No.   Oxygen Delivery: room air  DISCHARGE LOCATION:  nursing home    ADDITIONAL DISCHARGE INSTRUCTION:speech eval pt eval   If you experience worsening of your admission symptoms, develop shortness of breath, life threatening emergency, suicidal or homicidal thoughts you must seek medical attention immediately by calling 911 or calling your MD immediately  if symptoms less severe.  You Must read complete instructions/literature along with all the possible adverse reactions/side effects for all the Medicines you take and that have been prescribed to you. Take any new Medicines after you have completely understood and accpet all the possible adverse reactions/side effects.   Please note  You were cared for by a hospitalist during your hospital stay. If you have any questions about your discharge medications or the care you received while you were in the hospital after you are discharged, you can call the unit and asked to speak with the hospitalist on call if the hospitalist that took care of you is not available. Once you are discharged, your primary care physician will handle any further medical issues. Please note that NO REFILLS for any discharge medications will be authorized once you are discharged, as it is imperative that you return to your primary care physician (or establish a relationship with a primary care physician if you do not have one) for your aftercare needs so that they can reassess your need for medications and monitor your lab values.

## 2016-02-14 ENCOUNTER — Encounter: Payer: Self-pay | Admitting: Internal Medicine

## 2016-02-14 ENCOUNTER — Other Ambulatory Visit: Payer: Self-pay

## 2016-02-14 ENCOUNTER — Non-Acute Institutional Stay (SKILLED_NURSING_FACILITY): Payer: Medicare Other | Admitting: Internal Medicine

## 2016-02-14 ENCOUNTER — Ambulatory Visit: Payer: Medicare Other | Admitting: Cardiovascular Disease

## 2016-02-14 DIAGNOSIS — N400 Enlarged prostate without lower urinary tract symptoms: Secondary | ICD-10-CM | POA: Diagnosis not present

## 2016-02-14 DIAGNOSIS — J189 Pneumonia, unspecified organism: Secondary | ICD-10-CM

## 2016-02-14 DIAGNOSIS — I639 Cerebral infarction, unspecified: Secondary | ICD-10-CM | POA: Diagnosis not present

## 2016-02-14 DIAGNOSIS — E440 Moderate protein-calorie malnutrition: Secondary | ICD-10-CM | POA: Diagnosis not present

## 2016-02-14 DIAGNOSIS — I5032 Chronic diastolic (congestive) heart failure: Secondary | ICD-10-CM

## 2016-02-14 DIAGNOSIS — R531 Weakness: Secondary | ICD-10-CM

## 2016-02-14 DIAGNOSIS — J181 Lobar pneumonia, unspecified organism: Secondary | ICD-10-CM

## 2016-02-14 DIAGNOSIS — R2681 Unsteadiness on feet: Secondary | ICD-10-CM

## 2016-02-14 DIAGNOSIS — I110 Hypertensive heart disease with heart failure: Secondary | ICD-10-CM | POA: Diagnosis not present

## 2016-02-14 DIAGNOSIS — G9340 Encephalopathy, unspecified: Secondary | ICD-10-CM | POA: Diagnosis not present

## 2016-02-14 DIAGNOSIS — M792 Neuralgia and neuritis, unspecified: Secondary | ICD-10-CM | POA: Diagnosis not present

## 2016-02-14 MED ORDER — MODAFINIL 100 MG PO TABS: 100.0000 mg | ORAL_TABLET | Freq: Every day | ORAL | 5 refills | Status: DC

## 2016-02-14 MED ORDER — MODAFINIL 100 MG PO TABS: 100.0000 mg | ORAL_TABLET | Freq: Every day | ORAL | 5 refills | Status: AC

## 2016-02-14 NOTE — Addendum Note (Signed)
Addended by: Logan Bores on: 02/14/2016 02:13 PM   Modules accepted: Orders

## 2016-02-14 NOTE — Telephone Encounter (Signed)
Rx faxed to [facility] @ [phone], phone number [phone]  

## 2016-02-14 NOTE — Progress Notes (Addendum)
LOCATION: [facility]  PCP: Elsie Stain, MD   Code Status: Full Code  Goals of care: Advanced Directive information Advanced Directives 02/09/2016  Does Patient Have a Medical Advance Directive? No  Type of Advance Directive -  Does patient want to make changes to medical advance directive? -  Copy of Learned in Chart? -  Would patient like information on creating a medical advance directive? Yes (Inpatient - patient requests chaplain consult to create a medical advance directive)       Extended Emergency Contact Information Primary Emergency Contact: Champine,Pat Address: [address]          [place], [place] [address] [place] Phone: [phone] Mobile Phone: [phone] Relation: Spouse Secondary Emergency Contact: [place] Phone: [phone] Relation: Son   No Known Allergies  Chief Complaint  Patient presents with  . New Admit To SNF    New Admission Visit     HPI:  Patient is a 82 y.o. male seen today for short term rehabilitation post hospital admission from 02/09/16-02/13/16 with right sided weakness and facial droop. He was treated for acute ischemic stroke and aspiration pneumonia. MRI brain showed left MCA stroke. He was continued on aspirin 81 mg daily and placed on antibiotic. He has PMH of CHF, HTN, HLD. He is seen in his room today,   Review of Systems:  Constitutional: Negative for fever, chills, diaphoresis. Feels weak an tired. He has slow return of energy. HENT: Negative for headache, congestion, sore throat, difficulty swallowing. Positive for occasional nasal discharge.  Eyes: Negative for double vision and discharge.  Respiratory: Negative for shortness of breath and wheezing. Positive for occasional cough.  Cardiovascular: Negative for chest pain, palpitations, leg swelling.  Gastrointestinal: Negative for heartburn, nausea, vomiting, abdominal pain.  Last bowel movement was 2 days ago. Genitourinary: Negative for dysuria.  Musculoskeletal: Negative for back pain, fall in the facility.  Skin: Negative for itching, rash.  Neurological: Negative for dizziness. Psychiatric/Behavioral: Negative for depression.   Past Medical History:  Diagnosis Date  . Aortic valve stenosis 09/10/2012  . Arthritis   . Back pain   . Benign prostatic hypertrophy 10/18/00  . Cancer ([place])    skin cancers  . CHF (congestive heart failure) ([place]) 04/21/03  . Chronic diastolic congestive heart failure ([place])   . Colon polyps 06/05/2002   path could not be found   . Diverticulosis of colon 06/05/2002  . Hyperlipidemia 09/05/95  . Hypertension 05/19/03  . 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 Scoliosis   . Thinning of skin    bruises easy.   Past Surgical History:  Procedure Laterality Date  . CARDIAC CATHETERIZATION  10/27/2013  . COLONOSCOPY W/ BIOPSIES  05/2002  . EYE SURGERY     bilateral cataracts  . FLEXIBLE SIGMOIDOSCOPY  07/2005  . INTRAOPERATIVE TRANSESOPHAGEAL ECHOCARDIOGRAM N/A 02/03/2014   Procedure: INTRAOPERATIVE TRANSESOPHAGEAL ECHOCARDIOGRAM;  Surgeon: Sherren Mocha, MD;  Location: [facility] OR;  Service: Open Heart Surgery;  Laterality: N/A;  . KNEE ARTHROSCOPY  06/1997   right  . PLEURAL EFFUSION DRAINAGE     2017  . TEE WITHOUT CARDIOVERSION    . TONSILLECTOMY    . TRANSCATHETER AORTIC VALVE REPLACEMENT, TRANSFEMORAL N/A 02/03/2014   Procedure: TRANSCATHETER AORTIC VALVE REPLACEMENT, TRANSFEMORAL;  Surgeon: Sherren Mocha, MD;  Location: [facility];  Service:  Open Heart Surgery;  Laterality: N/A;   Social History:   reports that he quit smoking about 45 years ago. His smoking use included Cigarettes. He has a 20.00 pack-year smoking history. He has quit using smokeless tobacco. He reports that he does not drink alcohol or use  drugs.  Family History  Problem Relation Age of Onset  . Stroke Father   . Diabetes Brother   . Hip fracture Sister     after a fall  . Diabetes Brother   . Coronary artery disease Brother     carotid stenosis  . Hypertension Other   . Colon cancer Neg Hx   . Prostate cancer Neg Hx     Medications:   Medication List       Accurate as of 02/14/16 11:56 AM. Always use your most recent med list.          amLODipine 5 MG tablet Commonly known as:  NORVASC Take 1 tablet (5 mg total) by mouth daily.   aspirin 81 MG EC tablet Take 81 mg by mouth daily.   atorvastatin 20 MG tablet Commonly known as:  LIPITOR Take 2 tablets (40 mg total) by mouth daily.   finasteride 5 MG tablet Commonly known as:  PROSCAR Take 1 tablet (5 mg total) by mouth daily.   furosemide 20 MG tablet Commonly known as:  LASIX Take 1 tablet (20 mg total) by mouth daily.   gabapentin 100 MG capsule Commonly known as:  NEURONTIN Take 1 capsule (100 mg total) by mouth 3 (three) times daily. For burning pain   levofloxacin 750 MG tablet Commonly known as:  LEVAQUIN Take 1 tablet (750 mg total) by mouth daily.   modafinil 100 MG tablet Commonly known as:  PROVIGIL Take 1 tablet (100 mg total) by mouth daily.       Immunizations: Immunization History  Administered Date(s) Administered  . Influenza Split 01/04/2011, 12/28/2011  . Influenza Whole 12/18/2004, 12/19/2006, 12/16/2007, 12/31/2008, 12/29/2009  . Influenza,inj,Quad PF,36+ Mos 12/25/2012, 12/10/2013, 12/04/2014, 12/13/2015  . Influenza-Unspecified 02/06/2014  . PPD Test 02/06/2014, 01/14/2016  . Pneumococcal Conjugate-13 10/07/2015  . Pneumococcal Polysaccharide-23 06/08/1997  . Pneumococcal-Unspecified 02/06/2014  . Td 03/20/1993, 10/31/2000, 08/31/2014     Physical Exam:  Vitals:   02/14/16 1151  BP: 96/60  Pulse: 70  Resp: 18  Temp: 97.6 F (36.4 C)  TempSrc: Oral  SpO2: 96%  Weight: 145 lb (65.8 kg)  Height: 5'  9" (1.753 m)   Body mass index is 21.41 kg/m.  General- elderly male, frail and thin built, in no acute distress Head- normocephalic, atraumatic Nose- no maxillary or frontal sinus tenderness, no nasal discharge Throat- moist mucus membrane, normal oropharynx Eyes- PERRLA, EOMI, no pallor, no icterus, no discharge, normal conjunctiva, normal sclera Neck- no cervical lymphadenopathy Cardiovascular- normal s1,s2, no murmur, no leg edema Respiratory- bilateral clear to auscultation, no wheeze, no rhonchi, no crackles, no use of accessory muscles Abdomen- bowel sounds present, soft, non tender Musculoskeletal- able to move all 4 extremities, generalized weakness more prominent on right side, kyphosis and scoliosis present Neurological- alert and oriented to person and place but not to time,  Skin- warm and dry, senile purpura Psychiatry- normal mood and affect    Labs reviewed: Basic Metabolic Panel:  Recent Labs  01/12/16 0332 02/08/16 1327 02/09/16 1305 02/13/16 0432  NA 140 145 143  --   K 4.1 4.2 3.8  --   CL 104 108 110  --   CO2 29  31 27  --   GLUCOSE 114* 124* 101*  --   BUN 31* 29* 29*  --   CREATININE 0.90 1.04 0.98 0.96  CALCIUM 8.3* 9.1 8.7*  --    Liver Function Tests:  Recent Labs  01/05/16 1555 02/08/16 1327 02/09/16 1305  AST 28 11 22   ALT 41 14 16*  ALKPHOS 57 65 64  BILITOT 1.7* 1.0 1.2  PROT 6.6 6.3 6.5  ALBUMIN 3.4* 3.7 3.7   No results for input(s): LIPASE, AMYLASE in the last 8760 hours. No results for input(s): AMMONIA in the last 8760 hours. CBC:  Recent Labs  01/05/16 1555  02/08/16 1327 02/09/16 1305 02/13/16 0432  WBC 8.4  < > 8.3 7.6 5.2  NEUTROABS 7.3*  --  6.8 6.2  --   HGB 11.9*  < > 11.6* 12.1* 11.5*  HCT 35.2*  < > 34.5* 34.5* 33.2*  MCV 91.6  < > 89.7 88.9 89.2  PLT 128*  < > 187.0 175 180  < > = values in this interval not displayed. Cardiac Enzymes:  Recent Labs  02/09/16 1700 02/09/16 2319 02/10/16 0522   TROPONINI 0.05* 0.04* 0.05*   BNP: Invalid input(s): POCBNP CBG:  Recent Labs  02/09/16 1310  GLUCAP 89    Radiological Exams: Dg Chest 2 View  Result Date: 02/09/2016 CLINICAL DATA:  Right-sided weakness and facial droop EXAM: CHEST  2 VIEW COMPARISON:  02/08/2016 FINDINGS: Valvular prosthesis again visualized. Bilateral calcified pleural plaques. Bilateral calcified nodules unchanged. No change in small bilateral pleural effusions. Stable mild cardiomegaly with mild central congestion. No pneumothorax. IMPRESSION: 1. No significant interval change in small bilateral pleural effusions and mild left basilar opacity. 2. Stable mild cardiomegaly.  There is mild central congestion. 3. Calcified pleural plaques and small calcified lung nodules as before. Electronically Signed   By: Donavan Foil M.D.   On: 02/09/2016 20:44   Dg Chest 2 View  Result Date: 02/08/2016 CLINICAL DATA:  Shortness of breath, history of pneumonia EXAM: CHEST  2 VIEW COMPARISON:  01/11/2016, 01/10/2016, 02/04/2014 FINDINGS: There are small bilateral pleural effusions, unchanged on the right and decreased on the left. Overall improved aeration of the left lung base with residual airspace opacity noted. Stable cardiomediastinal silhouette with stent. Bilateral nodular opacities and calcified pleural plaques are grossly unchanged. Atherosclerosis. No pneumothorax. IMPRESSION: 1. Small right-sided pleural effusion, unchanged. Small left-sided pleural effusion, decreased compared to prior. 2. Mild residual left basilar opacity, atelectasis versus infiltrate. 3. Otherwise no significant interval changes. Electronically Signed   By: Donavan Foil M.D.   On: 02/08/2016 22:49   Mr Brain Wo Contrast  Result Date: 02/11/2016 CLINICAL DATA:  Right-sided weakness and facial droop with slurred speech. EXAM: MRI HEAD WITHOUT CONTRAST MRA HEAD WITHOUT CONTRAST TECHNIQUE: Multiplanar, multiecho pulse sequences of the brain and  surrounding structures were obtained without intravenous contrast. Angiographic images of the head were obtained using MRA technique without contrast. COMPARISON:  Head CT 02/09/2016 FINDINGS: MRI HEAD FINDINGS Brain: As seen on recent head CT, there is an acute left MCA territory infarct. This involves the left frontal lobe (predominantly operculum), insula, portions of the caudate and lentiform nuclei, and corona radiata. There is no evidence of associated hemorrhage. There is cytotoxic edema without significant mass effect. No mass, midline shift, or extra-axial fluid collection is present. There is a chronic microhemorrhage in the superior cerebellar vermis right of midline. Small foci of white matter T2 hyperintensity scattered throughout both cerebral hemispheres are nonspecific  but compatible with mild chronic small vessel ischemic disease. There is a chronic lacunar infarct in the white matter lateral to the left thalamus. Vascular: Abnormal FLAIR signal near the left MCA bifurcation is more fully evaluated below. The right vertebral artery is dominant. Skull and upper cervical spine: No focal marrow lesion. Sinuses/Orbits: Prior bilateral cataract extraction. Paranasal sinuses and mastoid air cells are clear. Other: None. MRA HEAD FINDINGS There is mild motion artifact. The visualized distal right vertebral artery is patent and dominant. There is poor flow related enhancement in the left V4 segment which may be partly technical in nature due to the vessel's somewhat small size and mild motion artifact as well as due to underlying stenosis, not well evaluated. PICA origins are grossly patent. Basilar artery is patent without stenosis. SCA origins are patent. There is a patent right posterior communicating artery. PCAs are patent without evidence of significant proximal stenosis. Internal carotid arteries are patent from skullbase to carotid termini without evidence of significant stenosis. M1 segments are  widely patent bilaterally. There is a proximal left M2 occlusion corresponding to the acute infarct. ACAs are patent without evidence of significant stenosis. Mild right A1 segment irregularity is attributed to motion artifact. No intracranial aneurysm is identified. IMPRESSION: 1. Acute left MCA infarct predominantly involving the frontal operculum and basal ganglia. 2. Proximal left M2 MCA branch occlusion. Electronically Signed   By: Logan Bores M.D.   On: 02/11/2016 15:00   US Carotid Bilateral (at [facility] And [facility] Only)  Result Date: 02/10/2016 CLINICAL DATA:  CVA.  History of hypertension and hyperlipidemia. EXAM: BILATERAL CAROTID DUPLEX ULTRASOUND TECHNIQUE: Pearline Cables scale imaging, color Doppler and duplex ultrasound were performed of bilateral carotid and vertebral arteries in the neck. COMPARISON:  None. FINDINGS: Criteria: Quantification of carotid stenosis is based on velocity parameters that correlate the residual internal carotid diameter with NASCET-based stenosis levels, using the diameter of the distal internal carotid lumen as the denominator for stenosis measurement. The following velocity measurements were obtained: RIGHT ICA:  101/21 cm/sec CCA:  [id number] cm/sec SYSTOLIC ICA/CCA RATIO:  1.1 DIASTOLIC ICA/CCA RATIO:  1.6 ECA:  125 cm/sec LEFT ICA:  113/23 cm/sec CCA:  [id number] cm/sec SYSTOLIC ICA/CCA RATIO:  1.2 DIASTOLIC ICA/CCA RATIO:  1.6 ECA:  125 cm/sec RIGHT CAROTID ARTERY: There is a minimal amount of eccentric mixed echogenic plaque within the right carotid bulb (images 12 and 13), extending to involve the origin proximal aspect the right internal carotid artery (image 20), not resulting in elevated peak systolic velocities within the interrogated course the right internal carotid artery to suggest a hemodynamically significant stenosis. RIGHT VERTEBRAL ARTERY:  Antegrade Flow LEFT CAROTID ARTERY: There is a minimal amount of focal eccentric echogenic plaque involving the proximal (images 31 and  32) and mid (images 35 and 36) aspects of the left common carotid artery. There is a minimal to moderate amount of eccentric mixed echogenic plaque within the left carotid bulb (images 44 and 45). There is a moderate to large amount of eccentric mixed echogenic plaque involving the origin and proximal aspects of the left internal carotid artery (image 53), not resulting in elevated peak systolic velocities within the interrogated course of the left internal carotid artery to suggest a hemodynamically significant stenosis. LEFT VERTEBRAL ARTERY:  Antegrade Flow IMPRESSION: 1. Moderate to large amount of left-sided atherosclerotic plaque, not resulting in hemodynamically significant stenosis. 2. Minimal amount of right-sided atherosclerotic plaque, not resulting in a hemodynamically significant stenosis. Electronically Signed   By: Jenny Reichmann  Watts M.D.   On: 02/10/2016 10:37   Mr Jodene Nam Head/brain [id number] Cm  Result Date: 02/11/2016 CLINICAL DATA:  Right-sided weakness and facial droop with slurred speech. EXAM: MRI HEAD WITHOUT CONTRAST MRA HEAD WITHOUT CONTRAST TECHNIQUE: Multiplanar, multiecho pulse sequences of the brain and surrounding structures were obtained without intravenous contrast. Angiographic images of the head were obtained using MRA technique without contrast. COMPARISON:  Head CT 02/09/2016 FINDINGS: MRI HEAD FINDINGS Brain: As seen on recent head CT, there is an acute left MCA territory infarct. This involves the left frontal lobe (predominantly operculum), insula, portions of the caudate and lentiform nuclei, and corona radiata. There is no evidence of associated hemorrhage. There is cytotoxic edema without significant mass effect. No mass, midline shift, or extra-axial fluid collection is present. There is a chronic microhemorrhage in the superior cerebellar vermis right of midline. Small foci of white matter T2 hyperintensity scattered throughout both cerebral hemispheres are nonspecific but compatible  with mild chronic small vessel ischemic disease. There is a chronic lacunar infarct in the white matter lateral to the left thalamus. Vascular: Abnormal FLAIR signal near the left MCA bifurcation is more fully evaluated below. The right vertebral artery is dominant. Skull and upper cervical spine: No focal marrow lesion. Sinuses/Orbits: Prior bilateral cataract extraction. Paranasal sinuses and mastoid air cells are clear. Other: None. MRA HEAD FINDINGS There is mild motion artifact. The visualized distal right vertebral artery is patent and dominant. There is poor flow related enhancement in the left V4 segment which may be partly technical in nature due to the vessel's somewhat small size and mild motion artifact as well as due to underlying stenosis, not well evaluated. PICA origins are grossly patent. Basilar artery is patent without stenosis. SCA origins are patent. There is a patent right posterior communicating artery. PCAs are patent without evidence of significant proximal stenosis. Internal carotid arteries are patent from skullbase to carotid termini without evidence of significant stenosis. M1 segments are widely patent bilaterally. There is a proximal left M2 occlusion corresponding to the acute infarct. ACAs are patent without evidence of significant stenosis. Mild right A1 segment irregularity is attributed to motion artifact. No intracranial aneurysm is identified. IMPRESSION: 1. Acute left MCA infarct predominantly involving the frontal operculum and basal ganglia. 2. Proximal left M2 MCA branch occlusion. Electronically Signed   By: Logan Bores M.D.   On: 02/11/2016 15:00   Ct Head Code Stroke W/o Cm  Result Date: 02/09/2016 CLINICAL DATA:  Code stroke. 82 year old male with confusion right side weakness facial droop and slurred speech. Initial encounter. EXAM: CT HEAD WITHOUT CONTRAST TECHNIQUE: Contiguous axial images were obtained from the base of the skull through the vertex without  intravenous contrast. COMPARISON:  None. FINDINGS: Brain: Positive for cytotoxic edema in the left MCA territory at the frontal operculum and involving a small portion of the anterior left insula. No associated hemorrhage or mass effect. Lentiform nuclei and internal capsule appear spared. Posterior insula and operculum are spared. No temporal lobe involvement is evident. No superior left MCA territory involvement. Normal gray-white matter differentiation in the right hemisphere and posterior fossa. No ventriculomegaly. Patent basilar cisterns. No intracranial mass effect. No acute intracranial hemorrhage identified. Vascular: Calcified atherosclerosis at the skull base. Positive for abnormal hyperdensity at the distal left MCA M1 segment and left MCA bifurcation (series 6, image 43). Skull: No acute osseous abnormality identified. Sinuses/Orbits: Clear. Other: No acute orbit or scalp soft tissue findings. ASPECTS [facility] Stroke Program Early CT Score) -  Ganglionic level infarction (caudate, lentiform nuclei, internal capsule, insula, M1-M3 cortex): 5 (minus 2 for the insula and M1 area). - Supraganglionic infarction (M4-M6 cortex): 3 Total score (0-10 with 10 being normal): 8 IMPRESSION: 1. Positive for a left MCA infarct with operculum cytotoxic edema, no associated hemorrhage. Positive hyperdense distal Left M1 suggesting Emergent Large Vessel Occlusion. 2. ASPECTS is 8. 3. Study discussed by telephone with Dr. Meade Maw on 02/09/2016 at 13:13 . Electronically Signed   By: Genevie Ann M.D.   On: 02/09/2016 13:14    Assessment/Plan  Unsteady gait With right sided weakness. Will have patient work with PT/OT as tolerated to regain strength and restore function.  Fall precautions are in place.  Acute left MCA stroke Continue aspirin ec 81 mg daily and atorvastatin 40 mg daily. Will have her work with physical therapy and occupational therapy team to help with gait training and muscle strengthening  exercises.fall precautions. Skin care. Encourage to be out of bed.   Acute encephalopathy Likely from CVA and recent pneumonia. Provide supportive care. Monitor for infection and worsening of his behavior.   Aspiration pneumonia Aspiration precautions, SLP consult. Continue levofloxacin 750 mg daily until 02/16/16.   Generalized weakness Will have patient work with PT/OT as tolerated to regain strength and restore function.  Fall precautions are in place.  Anemia of chronic disease Monitor cbc  CHF Stable, continue lasix 20 mg daily, check bmp  Moderate protein calorie malnutrition RD consult, weight monitor  Hypertension with chf Monitor bp, continue norvasc and lasix. Check bmp  BPH Continue finasteride  Neuropathic pain Continue gabapentin 100 mg tid    Goals of care: short term rehabilitation   Labs/tests ordered: cbc, bmp 02/21/16  Family/ staff Communication: reviewed care plan with patient and nursing supervisor    Blanchie Serve, MD Internal Medicine [place], [place] [address] Cell Phone (Monday-Friday 8 am - 5 pm): [phone] On Call: [phone] and follow prompts after 5 pm and on weekends Office Phone: [phone] Office Fax: [phone]   ADDENDUM:   BP reading has been low on review. Discontinue norvasc for now. Monitor BP q shift and add holding parameter to this. Also to monitor his weight 3 days a week with his history of diastolic CHF.   Blanchie Serve, MD  [facility] Adult [facility] [phone] (Monday-Friday 8 am - 5 pm) [phone] (afterhours)

## 2016-02-17 ENCOUNTER — Ambulatory Visit (INDEPENDENT_AMBULATORY_CARE_PROVIDER_SITE_OTHER): Payer: Medicare Other | Admitting: Cardiovascular Disease

## 2016-02-17 ENCOUNTER — Encounter: Payer: Self-pay | Admitting: Cardiovascular Disease

## 2016-02-17 VITALS — BP 158/60 | HR 70 | Ht 70.0 in | Wt 150.0 lb

## 2016-02-17 DIAGNOSIS — I350 Nonrheumatic aortic (valve) stenosis: Secondary | ICD-10-CM

## 2016-02-17 DIAGNOSIS — Z952 Presence of prosthetic heart valve: Secondary | ICD-10-CM

## 2016-02-17 DIAGNOSIS — I5032 Chronic diastolic (congestive) heart failure: Secondary | ICD-10-CM

## 2016-02-17 DIAGNOSIS — I639 Cerebral infarction, unspecified: Secondary | ICD-10-CM

## 2016-02-17 DIAGNOSIS — I272 Pulmonary hypertension, unspecified: Secondary | ICD-10-CM

## 2016-02-17 NOTE — Progress Notes (Signed)
Cardiology Office Note  Date:  02/17/2016   ID:  Douglas Parker Sr., DOB Jun 11, 1927, MRN [id number]  PCP:  Elsie Stain, MD   Chief Complaint  Patient presents with  . other    F/u hospital c/o weakness and sob. Meds reviewed verbally with pt.    HPI:  [name] is a 82 year old gentleman with no known coronary artery disease, history of hypertension, hyperlipidemia, chronic mild shortness of breath,  Severe aortic valve stenosis ,  who underwent TAVR 02/03/14 who presents for routine followup of his prosthetic aortic valveAnd for hospital follow-up after recent acute stroke. He has severe scoliosis and kyphosis  In follow-up today he reports worsening shortness of breath over the past several days, now on nasal cannula oxygen  Recently admitted to the hospital [facility]  02/09/2016 acute CVA.  MRI of the brain confirmed a left MCA stroke.   intracranial vessel obstruction that is not amendable to any surgical intervention.   noted to have a pneumonia similar to his previous admission one month ago per the notes. aspiration was considered. He was treated with antibiotics  Echocardiogram in the hospital showed normal ejection fraction, severely elevated right heart pressures Discharged on Lasix 20 mg daily Worsening shortness of breath recently Amlodipine held on arrival to nursing facility/rehabilitation given hypotension  Continues to have problems, kyphosis, scoliosis, chronic pain Unable to walk very far secondary to his back  Recent lab work reviewed showing normal creatinine Prior potassium on arrival to nursing facility 3.8  Other past medical history reviewed TEE performed October 2014 showing moderate to severe aortic valve stenosis, estimated aortic valve area 1.2 cm  follow-up echocardiogram following TAVR showing no significant gradient.  He has numerous animals at home. Sometimes spends more money on their food in his own. Review of blood work with him  shows chronic baseline anemia.    cardiac catheterization on 11/13/2013 that showed right dominant coronary system, possibly codominant with mild luminal irregularities, mild to moderate proximal LAD disease  Previous Echocardiogram in 2014 showed severe aortic valve stenosis, severe calcification of the valve with restriction of the leaflets. Mean gradient of 41 mm of mercury, peak gradient 80 mm of mercury  Previous Total cholesterol 154, LDL 84   Echocardiogram from April 2010 showed normal systolic function, minimal aortic valve stenosis, mildly dilated left atrium, mild TR. Carotid arterial Doppler done last year showed mild plaquing less than 39% bilaterally. Last stress test was 3 years ago with no ischemia. He exercised for 5 minutes, 30 seconds. Achieved 13 METS   PMH:   has a past medical history of Aortic valve stenosis (09/10/2012); Arthritis; Back pain; Benign prostatic hypertrophy (10/18/00); [facility]); CHF (congestive heart failure) ([place]) (04/21/03); Chronic diastolic congestive heart failure ([place]); Colon polyps (06/05/2002); Diverticulosis of colon (06/05/2002); Hyperlipidemia (09/05/95); Hypertension (05/19/03); S/P TAVR (transcatheter aortic valve replacement) (02/03/2014); Scoliosis; and Thinning of skin.  PSH:    Past Surgical History:  Procedure Laterality Date  . CARDIAC CATHETERIZATION  10/27/2013  . COLONOSCOPY W/ BIOPSIES  05/2002  . EYE SURGERY     bilateral cataracts  . FLEXIBLE SIGMOIDOSCOPY  07/2005  . INTRAOPERATIVE TRANSESOPHAGEAL ECHOCARDIOGRAM N/A 02/03/2014   Procedure: INTRAOPERATIVE TRANSESOPHAGEAL ECHOCARDIOGRAM;  Surgeon: Sherren Mocha, MD;  Location: [facility] OR;  Service: Open Heart Surgery;  Laterality: N/A;  . KNEE ARTHROSCOPY  06/1997   right  . PLEURAL EFFUSION DRAINAGE     2017  . TEE WITHOUT CARDIOVERSION    . TONSILLECTOMY    .  TRANSCATHETER AORTIC VALVE REPLACEMENT, TRANSFEMORAL N/A 02/03/2014   Procedure: TRANSCATHETER AORTIC VALVE REPLACEMENT,  TRANSFEMORAL;  Surgeon: Sherren Mocha, MD;  Location: [place];  Service: Open Heart Surgery;  Laterality: N/A;    Current Outpatient Prescriptions  Medication Sig Dispense Refill  . aspirin 81 MG EC tablet Take 81 mg by mouth daily.      Marland Kitchen atorvastatin (LIPITOR) 20 MG tablet Take 2 tablets (40 mg total) by mouth daily.    . finasteride (PROSCAR) 5 MG tablet Take 1 tablet (5 mg total) by mouth daily. 90 tablet 3  . furosemide (LASIX) 20 MG tablet Take 1 tablet (20 mg total) by mouth daily. 30 tablet 2  . gabapentin (NEURONTIN) 100 MG capsule Take 1 capsule (100 mg total) by mouth 3 (three) times daily. For burning pain 90 capsule 3  . modafinil (PROVIGIL) 100 MG tablet Take 1 tablet (100 mg total) by mouth daily. 30 tablet 5  . potassium chloride SA (K-DUR,KLOR-CON) 20 MEQ tablet Take 1 tablet (20 mEq total) by mouth daily. 90 tablet 3  . sildenafil (REVATIO) 20 MG tablet Take 1 tablet (20 mg total) by mouth 3 (three) times daily. 90 tablet 0   No current facility-administered medications for this visit.      Allergies:   Patient has no known allergies.   Social History:  The patient  reports that he quit smoking about 45 years ago. His smoking use included Cigarettes. He has a 20.00 pack-year smoking history. He has quit using smokeless tobacco. He reports that he does not drink alcohol or use drugs.   Family History:   family history includes Coronary artery disease in his brother; Diabetes in his brother and brother; Hip fracture in his sister; Hypertension in his other; Stroke in his father.    Review of Systems: Review of Systems  Constitutional: Positive for malaise/fatigue.  Respiratory: Positive for shortness of breath.   Cardiovascular: Negative.   Gastrointestinal: Negative.   Musculoskeletal: Positive for back pain.       Gait instability  Neurological: Positive for weakness.  Psychiatric/Behavioral: Negative.   All other systems reviewed and are negative.    PHYSICAL  EXAM: VS:  BP (!) 158/60 (BP Location: Left Arm, Patient Position: Sitting, Cuff Size: Normal)   Pulse 70   Ht 5\' 10"  (1.778 m)   Wt 150 lb (68 kg)   BMI 21.52 kg/m  , BMI Body mass index is 21.52 kg/m. GEN: Well nourished, well developed, in no acute distress  HEENT: normal  Neck: no JVD, carotid bruits, or masses Cardiac: RRR; no murmurs, rubs, or gallops,no edema  Respiratory:  Thomas at the bases bilaterally, normal work of breathing GI: soft, nontender, nondistended, + BS MS: no deformity or atrophy  Skin: warm and dry, no rash Neuro:  Strength and sensation are intact Psych: euthymic mood, full affect    Recent Labs: 01/05/2016: B Natriuretic Peptide 575.0 02/08/2016: Pro B Natriuretic peptide (BNP) 483.0 02/09/2016: ALT 16; BUN 29; Potassium 3.8; Sodium 143 02/10/2016: TSH 0.873 02/13/2016: Creatinine, Ser 0.96; Hemoglobin 11.5; Platelets 180    Lipid Panel Lab Results  Component Value Date   CHOL 131 02/10/2016   HDL 49 02/10/2016   LDLCALC 72 02/10/2016   TRIG 52 02/10/2016      Wt Readings from Last 3 Encounters:  02/17/16 150 lb (68 kg)  02/14/16 145 lb (65.8 kg)  02/09/16 149 lb (67.6 kg)       ASSESSMENT AND PLAN:  Pulmonary hypertension Markedly  elevated pressures on echocardiogram, 70 mmHg Concerning given his worsening shortness of breath, now on oxygen in the exam room today He reports having oxygen yesterday also with the rehabilitation  Recommended he increase Lasix up to 40 mg 4 days a week with potassium 20 mEq  Other days would take Lasix 20 mg as he is currently taking  Recommended he start  revatio 20 mg 3 times a day  He denies excessive fluid intake   Aortic valve stenosis, etiology of cardiac valve disease unspecified  Chronic diastolic congestive heart failure (HCC)  we'll increase Lasix as above  Denies excessive fluid intake   Aortic stenosis, severe  Acute CVA (cerebrovascular accident) ([place])  recent stroke, small  vessel disease Currently on aspirin, statin  S/P TAVR (transcatheter aortic valve replacement)  echocardiogram showing well-seated well-functioning bioprosthetic valve    Total encounter time more than 25 minutes  Greater than 50% was spent in counseling and coordination of care with the patient  Disposition:   F/U  2 months  No orders of the defined types were placed in this encounter.    Signed, Esmond Plants, M.D., Ph.D. 02/17/2016  [place], [place]

## 2016-02-17 NOTE — Patient Instructions (Addendum)
Medication Instructions:   Please start revatio 20 mg TID (for pulmonary hypertension and shortness of breath)  Please increase lasix up to 40 mg daily on Monday, Wednesday, Friday and Saturday with potassium 20 meq on Monday, Wednesday Friday and Saturday  Labwork:  No new labs needed  Testing/Procedures:  No further testing at this time   I recommend watching educational videos on topics of interest to you at:       [email or web address]  Enter code: HEARTCARE    Follow-Up: It was a pleasure seeing you in the office today. Please call us if you have new issues that need to be addressed before your next appt.  [phone]  Your physician wants you to follow-up in: 2 months.    If you need a refill on your cardiac medications before your next appointment, please call your pharmacy.

## 2016-02-21 LAB — BASIC METABOLIC PANEL
BUN: 50 mg/dL — AB (ref 4–21)
CREATININE: 1.1 mg/dL (ref 0.6–1.3)
POTASSIUM: 3.7 mmol/L (ref 3.4–5.3)
SODIUM: 150 mmol/L — AB (ref 137–147)

## 2016-02-21 LAB — CBC AND DIFFERENTIAL
HCT: 36 % — AB (ref 41–53)
Hemoglobin: 11.4 g/dL — AB (ref 13.5–17.5)
Platelets: 159 10*3/uL (ref 150–399)
WBC: 7.2 10^3/mL

## 2016-02-22 ENCOUNTER — Encounter (HOSPITAL_COMMUNITY): Payer: Self-pay

## 2016-02-22 ENCOUNTER — Emergency Department (HOSPITAL_COMMUNITY): Payer: Medicare Other

## 2016-02-22 ENCOUNTER — Non-Acute Institutional Stay (SKILLED_NURSING_FACILITY): Payer: Medicare Other | Admitting: Family

## 2016-02-22 ENCOUNTER — Inpatient Hospital Stay (HOSPITAL_COMMUNITY)
Admission: EM | Admit: 2016-02-22 | Discharge: 2016-02-26 | DRG: 308 | Disposition: A | Discharge status: Hospice | Payer: Medicare Other | Attending: Internal Medicine | Admitting: Internal Medicine

## 2016-02-22 DIAGNOSIS — I5032 Chronic diastolic (congestive) heart failure: Secondary | ICD-10-CM

## 2016-02-22 DIAGNOSIS — Z8673 Personal history of transient ischemic attack (TIA), and cerebral infarction without residual deficits: Secondary | ICD-10-CM

## 2016-02-22 DIAGNOSIS — Z515 Encounter for palliative care: Secondary | ICD-10-CM

## 2016-02-22 DIAGNOSIS — E860 Dehydration: Secondary | ICD-10-CM | POA: Diagnosis present

## 2016-02-22 DIAGNOSIS — F0390 Unspecified dementia without behavioral disturbance: Secondary | ICD-10-CM | POA: Diagnosis present

## 2016-02-22 DIAGNOSIS — Z833 Family history of diabetes mellitus: Secondary | ICD-10-CM

## 2016-02-22 DIAGNOSIS — D696 Thrombocytopenia, unspecified: Secondary | ICD-10-CM

## 2016-02-22 DIAGNOSIS — Z952 Presence of prosthetic heart valve: Secondary | ICD-10-CM

## 2016-02-22 DIAGNOSIS — Z7189 Other specified counseling: Secondary | ICD-10-CM

## 2016-02-22 DIAGNOSIS — Z79899 Other long term (current) drug therapy: Secondary | ICD-10-CM

## 2016-02-22 DIAGNOSIS — R7989 Other specified abnormal findings of blood chemistry: Secondary | ICD-10-CM

## 2016-02-22 DIAGNOSIS — I959 Hypotension, unspecified: Secondary | ICD-10-CM | POA: Diagnosis present

## 2016-02-22 DIAGNOSIS — R778 Other specified abnormalities of plasma proteins: Secondary | ICD-10-CM

## 2016-02-22 DIAGNOSIS — I4891 Unspecified atrial fibrillation: Secondary | ICD-10-CM | POA: Diagnosis not present

## 2016-02-22 DIAGNOSIS — I110 Hypertensive heart disease with heart failure: Secondary | ICD-10-CM | POA: Diagnosis present

## 2016-02-22 DIAGNOSIS — Z87891 Personal history of nicotine dependence: Secondary | ICD-10-CM

## 2016-02-22 DIAGNOSIS — E785 Hyperlipidemia, unspecified: Secondary | ICD-10-CM | POA: Diagnosis present

## 2016-02-22 DIAGNOSIS — Z823 Family history of stroke: Secondary | ICD-10-CM

## 2016-02-22 DIAGNOSIS — Z66 Do not resuscitate: Secondary | ICD-10-CM | POA: Diagnosis present

## 2016-02-22 DIAGNOSIS — I69351 Hemiplegia and hemiparesis following cerebral infarction affecting right dominant side: Secondary | ICD-10-CM

## 2016-02-22 DIAGNOSIS — D638 Anemia in other chronic diseases classified elsewhere: Secondary | ICD-10-CM | POA: Diagnosis present

## 2016-02-22 DIAGNOSIS — E162 Hypoglycemia, unspecified: Secondary | ICD-10-CM | POA: Diagnosis present

## 2016-02-22 DIAGNOSIS — Z85828 Personal history of other malignant neoplasm of skin: Secondary | ICD-10-CM

## 2016-02-22 DIAGNOSIS — Z7982 Long term (current) use of aspirin: Secondary | ICD-10-CM

## 2016-02-22 DIAGNOSIS — Z86010 Personal history of colonic polyps: Secondary | ICD-10-CM

## 2016-02-22 DIAGNOSIS — M419 Scoliosis, unspecified: Secondary | ICD-10-CM | POA: Diagnosis present

## 2016-02-22 DIAGNOSIS — N400 Enlarged prostate without lower urinary tract symptoms: Secondary | ICD-10-CM | POA: Diagnosis present

## 2016-02-22 DIAGNOSIS — E870 Hyperosmolality and hypernatremia: Secondary | ICD-10-CM | POA: Diagnosis present

## 2016-02-22 DIAGNOSIS — I63512 Cerebral infarction due to unspecified occlusion or stenosis of left middle cerebral artery: Secondary | ICD-10-CM | POA: Diagnosis present

## 2016-02-22 DIAGNOSIS — Z8249 Family history of ischemic heart disease and other diseases of the circulatory system: Secondary | ICD-10-CM

## 2016-02-22 DIAGNOSIS — R600 Localized edema: Secondary | ICD-10-CM | POA: Diagnosis present

## 2016-02-22 DIAGNOSIS — R251 Tremor, unspecified: Secondary | ICD-10-CM

## 2016-02-22 DIAGNOSIS — I5033 Acute on chronic diastolic (congestive) heart failure: Secondary | ICD-10-CM | POA: Diagnosis present

## 2016-02-22 DIAGNOSIS — I248 Other forms of acute ischemic heart disease: Secondary | ICD-10-CM | POA: Diagnosis present

## 2016-02-22 DIAGNOSIS — I272 Pulmonary hypertension, unspecified: Secondary | ICD-10-CM | POA: Diagnosis present

## 2016-02-22 DIAGNOSIS — D6959 Other secondary thrombocytopenia: Secondary | ICD-10-CM | POA: Diagnosis present

## 2016-02-22 LAB — CBC WITH DIFFERENTIAL/PLATELET
Basophils Absolute: 0 10*3/uL (ref 0.0–0.1)
Basophils Relative: 0 %
EOS ABS: 0.1 10*3/uL (ref 0.0–0.7)
Eosinophils Relative: 1 %
HEMATOCRIT: 35 % — AB (ref 39.0–52.0)
HEMOGLOBIN: 11.1 g/dL — AB (ref 13.0–17.0)
LYMPHS ABS: 1.2 10*3/uL (ref 0.7–4.0)
Lymphocytes Relative: 18 %
MCH: 29.6 pg (ref 26.0–34.0)
MCHC: 31.7 g/dL (ref 30.0–36.0)
MCV: 93.3 fL (ref 78.0–100.0)
Monocytes Absolute: 0.4 10*3/uL (ref 0.1–1.0)
Monocytes Relative: 5 %
NEUTROS ABS: 5.1 10*3/uL (ref 1.7–7.7)
NEUTROS PCT: 76 %
Platelets: 132 10*3/uL — ABNORMAL LOW (ref 150–400)
RBC: 3.75 MIL/uL — AB (ref 4.22–5.81)
RDW: 14.3 % (ref 11.5–15.5)
WBC: 6.8 10*3/uL (ref 4.0–10.5)

## 2016-02-22 LAB — I-STAT TROPONIN, ED: TROPONIN I, POC: 0.08 ng/mL (ref 0.00–0.08)

## 2016-02-22 LAB — URINALYSIS, ROUTINE W REFLEX MICROSCOPIC
BACTERIA UA: NONE SEEN
Bilirubin Urine: NEGATIVE
Glucose, UA: >=500 mg/dL — AB
Hgb urine dipstick: NEGATIVE
KETONES UR: NEGATIVE mg/dL
Leukocytes, UA: NEGATIVE
NITRITE: NEGATIVE
PROTEIN: 30 mg/dL — AB
Specific Gravity, Urine: 1.024 (ref 1.005–1.030)
Squamous Epithelial / LPF: NONE SEEN
pH: 5 (ref 5.0–8.0)

## 2016-02-22 LAB — COMPREHENSIVE METABOLIC PANEL
ALT: 15 U/L — AB (ref 17–63)
ANION GAP: 7 (ref 5–15)
AST: 19 U/L (ref 15–41)
Albumin: 3.3 g/dL — ABNORMAL LOW (ref 3.5–5.0)
Alkaline Phosphatase: 53 U/L (ref 38–126)
BUN: 49 mg/dL — ABNORMAL HIGH (ref 6–20)
CHLORIDE: 110 mmol/L (ref 101–111)
CO2: 28 mmol/L (ref 22–32)
CREATININE: 1.19 mg/dL (ref 0.61–1.24)
Calcium: 8.7 mg/dL — ABNORMAL LOW (ref 8.9–10.3)
GFR, EST NON AFRICAN AMERICAN: 53 mL/min — AB (ref >60)
Glucose, Bld: 256 mg/dL — ABNORMAL HIGH (ref 65–99)
POTASSIUM: 3.9 mmol/L (ref 3.5–5.1)
Sodium: 145 mmol/L (ref 135–145)
Total Bilirubin: 0.6 mg/dL (ref 0.3–1.2)
Total Protein: 6 g/dL — ABNORMAL LOW (ref 6.5–8.1)

## 2016-02-22 LAB — PROTIME-INR
INR: 1.27
PROTHROMBIN TIME: 16 s — AB (ref 11.4–15.2)

## 2016-02-22 LAB — I-STAT CG4 LACTIC ACID, ED: LACTIC ACID, VENOUS: 1.36 mmol/L (ref 0.5–1.9)

## 2016-02-22 LAB — CBG MONITORING, ED
GLUCOSE-CAPILLARY: 47 mg/dL — AB (ref 65–99)
GLUCOSE-CAPILLARY: 63 mg/dL — AB (ref 65–99)
Glucose-Capillary: 137 mg/dL — ABNORMAL HIGH (ref 65–99)

## 2016-02-22 LAB — BRAIN NATRIURETIC PEPTIDE: B NATRIURETIC PEPTIDE 5: 552.4 pg/mL — AB (ref 0.0–100.0)

## 2016-02-22 MED ORDER — FUROSEMIDE 20 MG PO TABS: 20.0000 mg | ORAL_TABLET | Freq: Every day | ORAL | Status: AC

## 2016-02-22 MED ORDER — DILTIAZEM HCL 25 MG/5ML IV SOLN
10.0000 mg | Freq: Once | INTRAVENOUS | Status: AC
  Administered 2016-02-22: 10 mg via INTRAVENOUS
  Filled 2016-02-22: qty 5

## 2016-02-22 MED ORDER — DILTIAZEM HCL 100 MG IV SOLR
5.0000 mg/h | Freq: Once | INTRAVENOUS | Status: AC
  Administered 2016-02-22: 5 mg/h via INTRAVENOUS
  Filled 2016-02-22: qty 100

## 2016-02-22 MED ORDER — DEXTROSE 50 % IV SOLN
INTRAVENOUS | Status: AC
  Administered 2016-02-22: 50 mL
  Filled 2016-02-22: qty 50

## 2016-02-22 NOTE — ED Provider Notes (Signed)
[facility] DEPT Provider Note   CSN: [id number] Arrival date & time: 02/22/16  2127     History   Chief Complaint Chief Complaint  Patient presents with  . Weakness  . Tremors    HPI Douglas NEISWONGER Sr. is a 82 y.o. male.  HPI   She is a 82 year old male with recent admission for stroke. He's got a history significant for aortic stenosis, hypertension, CHF, status post Her procedure, dementia BPH, presenting today with weakness. Patient able to unable to give any kind of history at this time. Patient oriented only to self.  On arrival patient's blood sugar 46, heart rate 144, A. fib with RVR  Level V caveat dementia.   Past Medical History:  Diagnosis Date  . Aortic valve stenosis 09/10/2012  . Arthritis   . Back pain   . Benign prostatic hypertrophy 10/18/00  . Cancer ([place])    skin cancers  . CHF (congestive heart failure) ([place]) 04/21/03  . Chronic diastolic congestive heart failure ([place])   . Colon polyps 06/05/2002   path could not be found   . Diverticulosis of colon 06/05/2002  . Hyperlipidemia 09/05/95  . Hypertension 05/19/03  . 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 Scoliosis   . Thinning of skin    bruises easy.    Patient Active Problem List   Diagnosis Date Noted  . Acute CVA (cerebrovascular accident) ([place]) 02/09/2016  . Pneumonia 01/05/2016  . Atypical chest pain 12/01/2015  . Cervical lymphadenopathy 04/20/2015  . CCF (congestive cardiac failure) ([employer]) 04/03/2014  . BPH (benign prostatic hyperplasia) 02/10/2014  . Dyslipidemia 02/10/2014  . GERD (gastroesophageal reflux disease) 02/10/2014  . Severe aortic valve stenosis 02/03/2014  . 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 Aortic stenosis, severe 12/19/2013  . Chronic diastolic congestive heart failure ([place])   . Back pain   . Edema 11/20/2013  . Loss  of weight 06/26/2013  . Cornea scar 04/09/2013  . Chronic glaucoma 12/17/2012  . Aortic valve stenosis 09/10/2012  . Pseudoaphakia 06/28/2012  . Living will, counseling/discussion 12/28/2011  . 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 Degeneration macular 03/06/2011  . Constipation 01/02/2007  . Hypertensive heart disease with CHF (congestive heart failure) ([place]) 05/19/2003  . CA IN SITU, SKIN NOS 05/19/1999  . Arthropathy of pelvic region and thigh 05/18/1997  . HEMORRHOIDS, INTERNAL, WITH BLEEDING 03/20/1994  . DIVERTICULOSIS, COLON 02/17/1994  . FX OPEN MLT PELVIS W/PELVIC CIRCULAT DISRUPT 03/20/1986  . SCOLIOSIS, THORACIC SPINE 03/20/1944    Past Surgical History:  Procedure Laterality Date  . CARDIAC CATHETERIZATION  10/27/2013  . COLONOSCOPY W/ BIOPSIES  05/2002  . EYE SURGERY     bilateral cataracts  . FLEXIBLE SIGMOIDOSCOPY  07/2005  . INTRAOPERATIVE TRANSESOPHAGEAL ECHOCARDIOGRAM N/A 02/03/2014   Procedure: INTRAOPERATIVE TRANSESOPHAGEAL ECHOCARDIOGRAM;  Surgeon: Sherren Mocha, MD;  Location: [facility] OR;  Service: Open Heart Surgery;  Laterality: N/A;  . KNEE ARTHROSCOPY  06/1997   right  . PLEURAL EFFUSION DRAINAGE     2017  . TEE WITHOUT CARDIOVERSION    . TONSILLECTOMY    . TRANSCATHETER AORTIC VALVE REPLACEMENT, TRANSFEMORAL N/A 02/03/2014   Procedure: TRANSCATHETER AORTIC VALVE REPLACEMENT, TRANSFEMORAL;  Surgeon: Sherren Mocha, MD;  Location: [place];  Service: Open Heart Surgery;  Laterality: N/A;       Home Medications    Prior to Admission medications   Medication  Sig Start Date End Date Taking? Authorizing Provider  aspirin 81 MG EC tablet Take 81 mg by mouth daily.     Yes Historical Provider, MD  atorvastatin (LIPITOR) 20 MG tablet Take 2 tablets (40 mg total) by mouth daily. 02/13/16  Yes Dustin Flock, MD  finasteride (PROSCAR) 5 MG tablet Take 1 tablet (5 mg total) by mouth daily. 10/07/15  Yes Tonia Ghent, MD  furosemide (LASIX) 20 MG tablet Take 1  tablet (20 mg total) by mouth daily. On Mon, Wed, Fri and sat Patient taking differently: Take 20 mg by mouth See admin instructions. On Mon, Wed, Fri and sat 02/22/16  Yes Dinah C Ngetich, NP  gabapentin (NEURONTIN) 100 MG capsule Take 1 capsule (100 mg total) by mouth 3 (three) times daily. For burning pain 10/07/15  Yes Tonia Ghent, MD  modafinil (PROVIGIL) 100 MG tablet Take 1 tablet (100 mg total) by mouth daily. 02/14/16  Yes Lauree Chandler, NP  Multiple Vitamin (MULTIVITAMIN WITH MINERALS) TABS tablet Take 1 tablet by mouth daily.   Yes Historical Provider, MD  potassium chloride SA (K-DUR,KLOR-CON) 20 MEQ tablet Take 20 mEq by mouth See admin instructions. On Mon, Wed, Fri and sat 02/17/16  Yes Minna Merritts, MD  sildenafil (REVATIO) 20 MG tablet Take 1 tablet (20 mg total) by mouth 3 (three) times daily. 02/17/16  Yes Minna Merritts, MD    Family History Family History  Problem Relation Age of Onset  . Stroke Father   . Diabetes Brother   . Hip fracture Sister     after a fall  . Diabetes Brother   . Coronary artery disease Brother     carotid stenosis  . Hypertension Other   . Colon cancer Neg Hx   . Prostate cancer Neg Hx     Social History Social History  Substance Use Topics  . Smoking status: Former Smoker    Packs/day: 1.00    Years: 20.00    Types: Cigarettes    Quit date: 01/11/1971  . Smokeless tobacco: Former Systems developer     Comment: quit 40 years ago  . Alcohol use No     Allergies   Patient has no known allergies.   Review of Systems Review of Systems  Unable to perform ROS: Dementia     Physical Exam Updated Vital Signs BP 114/84 (BP Location: Right Arm)   Pulse 119   Temp 97.6 F (36.4 C) (Oral)   Resp 13   SpO2 98%   Physical Exam  Constitutional: He appears well-nourished.  Cachectic elderly male.  HENT:  Head: Normocephalic and atraumatic.  Eyes: Conjunctivae are normal.  Cardiovascular: Normal rate.   Irregularly irregular  A. fib with RVR.  Pulmonary/Chest: Effort normal.  Mild crackles bilaterally.  Neurological: No cranial nerve deficit. Coordination normal.  Oriented 1  Skin: Skin is warm and dry. He is not diaphoretic.  Chronic bruising.  Psychiatric: He has a normal mood and affect. His behavior is normal.     ED Treatments / Results  Labs (all labs ordered are listed, but only abnormal results are displayed) Labs Reviewed  COMPREHENSIVE METABOLIC PANEL - Abnormal; Notable for the following:       Result Value   Glucose, Bld 256 (*)    BUN 49 (*)    Calcium 8.7 (*)    Total Protein 6.0 (*)    Albumin 3.3 (*)    ALT 15 (*)    GFR calc non Af  Amer 53 (*)    All other components within normal limits  CBC WITH DIFFERENTIAL/PLATELET - Abnormal; Notable for the following:    RBC 3.75 (*)    Hemoglobin 11.1 (*)    HCT 35.0 (*)    Platelets 132 (*)    All other components within normal limits  BRAIN NATRIURETIC PEPTIDE - Abnormal; Notable for the following:    B Natriuretic Peptide 552.4 (*)    All other components within normal limits  PROTIME-INR - Abnormal; Notable for the following:    Prothrombin Time 16.0 (*)    All other components within normal limits  URINALYSIS, ROUTINE W REFLEX MICROSCOPIC - Abnormal; Notable for the following:    Color, Urine AMBER (*)    APPearance HAZY (*)    Glucose, UA >=500 (*)    Protein, ur 30 (*)    All other components within normal limits  CBG MONITORING, ED - Abnormal; Notable for the following:    Glucose-Capillary 47 (*)    All other components within normal limits  CBG MONITORING, ED - Abnormal; Notable for the following:    Glucose-Capillary 63 (*)    All other components within normal limits  CBG MONITORING, ED - Abnormal; Notable for the following:    Glucose-Capillary 137 (*)    All other components within normal limits  URINE CULTURE  I-STAT CG4 LACTIC ACID, ED  I-STAT TROPOININ, ED    EKG  EKG Interpretation  Date/Time:  Tuesday  February 22 2016 21:30:08 EST Ventricular Rate:  156 PR Interval:    QRS Duration: 66 QT Interval:  284 QTC Calculation: 457 R Axis:   27 Text Interpretation:  Atrial fibrillation with rapid ventricular response Abnormal QRS-T angle, consider primary T wave abnormality Abnormal ECG Atrial fibrillation Confirmed by Gerald Leitz ([id number]) on 02/22/2016 9:53:13 PM       Radiology Dg Chest Portable 1 View  Result Date: 02/22/2016 CLINICAL DATA:  82 y/o M; chest pain, shortness of breath, and atrial fibrillation. EXAM: PORTABLE CHEST 1 VIEW COMPARISON:  02/09/2016 chest radiograph. FINDINGS: Stable mild cardiomegaly. Stented aortic valve replacement. Calcified pleural plaques. Interval improvement in bilateral pleural effusions. Aortic atherosclerosis with arch calcification. No new focal consolidation of the lungs. Moderate dextrocurvature of the lower thoracic spine. IMPRESSION: Interval improvement in bilateral pleural effusions. No new acute pulmonary process. Electronically Signed   By: Kristine Garbe M.D.   On: 02/22/2016 22:54    Procedures Procedures (including critical care time)  Medications Ordered in ED Medications  dextrose 50 % solution (50 mLs  Given 02/22/16 2200)  diltiazem (CARDIZEM) injection 10 mg (10 mg Intravenous Given 02/22/16 2153)  diltiazem (CARDIZEM) 100 mg in dextrose 5 % 100 mL (1 mg/mL) infusion (5 mg/hr Intravenous New Bag/Given 02/22/16 2220)     Initial Impression / Assessment and Plan / ED Course  I have reviewed the triage vital signs and the nursing notes.  Pertinent labs & imaging results that were available during my care of the patient were reviewed by me and considered in my medical decision making (see chart for details).  Clinical Course     Pt is a 82 year old male with dementia, CHF, aortic stenosis, status post's TAVR, CHF, presenting today with weakness. Patient had  blood sugar 40. Found to be in A. fib with RVR.  Looking at  cardiology notes, since it appears that patient has neverr been in A. fib with RVR before. Patient has pitting edema bilateral lower extremities. And has had changes in  lasix recently.  We'll try to get in touch with  family, nursing home to get a better idea of the story.  11:51 PM Patient given dilt. Patient's heart rate mildly lowered from 165 to the 110s however patient's blood pressure dropped. We will start dilt drip we're able to titrate more easily. I did not comfortable doing a fluid bolus given patient's bilateral edema and mild crackles on exam.  Patient's family here. They report that they went to the nursing home today and he was unable to move the right side of his body and was confused.  Unsure when patient started in A. Fib.  I suspect the low sugars will cause the neuro symptoms.  However we will need to admit for new-onset A. fib, and make sure that there is no new stroke on MRI. As well as continue to evaluate sugars.  Final Clinical Impressions(s) / ED Diagnoses   Final diagnoses:  None    New Prescriptions New Prescriptions   No medications on file     Douglas Krieger Julio Alm, MD 02/22/16 2352

## 2016-02-22 NOTE — ED Triage Notes (Signed)
Pt arrives via [facility] from [facility] c/o right sided weakness. Pt has hx of TIA and was seen here last week. Pt alert and oriented x4 upon arrival. Last seen well was Sunday by family members. Family reported to EMS that the pt had increased weakness on the right side during their visit.

## 2016-02-22 NOTE — ED Notes (Signed)
Pt taken to CT.

## 2016-02-22 NOTE — Progress Notes (Signed)
Location:  [facility] Room Number: [id number] Place of Service:  SNF [phone]) Provider:  Kaisen Ackers FNP-C   Elsie Stain, MD  Patient Care Team: Tonia Ghent, MD as PCP - General (Family Medicine) Minna Merritts, MD as Consulting Physician (Cardiology)  Extended Emergency Contact Information Primary Emergency Contact: Kley,Pat Address: [address]          [place], [place] [address] [place] of [place] Phone: [phone] Mobile Phone: [phone] Relation: Spouse Secondary Emergency Contact: Z[place]ki of [place] Phone: [phone] Relation: Son  Code Status:  Full Code  Goals of care: Advanced Directive information Advanced Directives 02/09/2016  Does Patient Have a Medical Advance Directive? No  Type of Advance Directive -  Does patient want to make changes to medical advance directive? -  Copy of [place] in Chart? -  Would patient like information on creating a medical advance directive? Yes (Inpatient - patient requests chaplain consult to create a medical advance directive)     Chief Complaint  Patient presents with  . Acute Visit    abnormal lab results     HPI:  Pt is a 82 y.o. male seen today at [facility] for an acute visit for evaluation of abnormal lab results. He has a significant medical history of HTN, CHF, Severe  Aortic Stenosis, BPH among other conditions. He is seen in his room today. He denies any acute issues this visit. He was seen 02/17/2016 by [place] DR. Ida Rogue at [phone] Furosemide dose was increased to 40 mg Tablet on Mon, Wed, Fri and Sat along with Potassium 20 meq tablet. Revatio 20 mg Tablet three times daily was also started. His lab results showed Hgb 11.4 ( improved from previous 10.3), HCT 36.0, Na 150, Cl 111, BUN 50 ( 02/21/2016).He denies drinking excess fluid. He is currently on Nectar thicken liquids.  Will adjust Furosemide dosage and fax lab results to [place] DR. Ida Rogue office.    Past Medical History:  Diagnosis Date  . Aortic valve stenosis 09/10/2012  . Arthritis   . Back pain   . Benign prostatic hypertrophy 10/18/00  . Cancer ([name])    skin cancers  . CHF (congestive heart failure) ([place]) 04/21/03  . Chronic diastolic congestive heart failure ([place])   . Colon polyps 06/05/2002   path could not be found   . Diverticulosis of colon 06/05/2002  . Hyperlipidemia 09/05/95  . Hypertension 05/19/03  . 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 Scoliosis   . Thinning of skin    bruises easy.   Past Surgical History:  Procedure Laterality Date  . CARDIAC CATHETERIZATION  10/27/2013  . COLONOSCOPY W/ BIOPSIES  05/2002  . EYE SURGERY     bilateral cataracts  . FLEXIBLE SIGMOIDOSCOPY  07/2005  . INTRAOPERATIVE TRANSESOPHAGEAL ECHOCARDIOGRAM N/A 02/03/2014   Procedure: INTRAOPERATIVE TRANSESOPHAGEAL ECHOCARDIOGRAM;  Surgeon: Sherren Mocha, MD;  Location: [facility] OR;  Service: Open Heart Surgery;  Laterality: N/A;  . KNEE ARTHROSCOPY  06/1997   right  . PLEURAL EFFUSION DRAINAGE     2017  . TEE WITHOUT CARDIOVERSION    . TONSILLECTOMY    . TRANSCATHETER AORTIC VALVE REPLACEMENT, TRANSFEMORAL N/A 02/03/2014   Procedure: TRANSCATHETER AORTIC VALVE REPLACEMENT, TRANSFEMORAL;  Surgeon: Sherren Mocha, MD;  Location: [facility];  Service: Open Heart Surgery;  Laterality: N/A;    No Known Allergies    Medication List       Accurate as of 02/22/16  5:57 PM. Always use your most recent med list.          aspirin 81 MG EC tablet Take 81 mg by mouth daily.   atorvastatin 20 MG tablet Commonly known as:  LIPITOR Take 2 tablets (40 mg total) by mouth daily.   finasteride 5 MG tablet Commonly known as:  PROSCAR Take 1 tablet (5 mg total) by mouth daily.   furosemide  20 MG tablet Commonly known as:  LASIX Take 1 tablet (20 mg total) by mouth daily. On Mon, Wed, Fri and sat   gabapentin 100 MG capsule Commonly known as:  NEURONTIN Take 1 capsule (100 mg total) by mouth 3 (three) times daily. For burning pain   modafinil 100 MG tablet Commonly known as:  PROVIGIL Take 1 tablet (100 mg total) by mouth daily.   potassium chloride SA 20 MEQ tablet Commonly known as:  K-DUR,KLOR-CON Take 20 mEq by mouth daily. On Mon, Wed, Fri and sat   REVATIO 20 MG tablet Generic drug:  sildenafil Take 1 tablet (20 mg total) by mouth 3 (three) times daily.       Review of Systems  Constitutional: Negative for activity change, appetite change, chills, fatigue and fever.  HENT: Negative for congestion, rhinorrhea, sinus pain, sinus pressure and sore throat.   Eyes: Negative.   Respiratory: Negative for cough, chest tightness, shortness of breath and wheezing.   Cardiovascular: Negative for chest pain, palpitations and leg swelling.  Gastrointestinal: Negative for abdominal distention, abdominal pain, constipation, diarrhea, nausea and vomiting.  Genitourinary: Negative for dysuria, frequency and urgency.  Musculoskeletal: Positive for back pain and gait problem.  Skin: Negative.   Neurological: Negative for dizziness, syncope, weakness, light-headedness, numbness and headaches.  Psychiatric/Behavioral: Negative for agitation, confusion, hallucinations and sleep disturbance. The patient is not nervous/anxious.     Immunization History  Administered Date(s) Administered  . Influenza Split 01/04/2011, 12/28/2011  . Influenza Whole 12/18/2004, 12/19/2006, 12/16/2007, 12/31/2008, 12/29/2009  . Influenza,inj,Quad PF,36+ Mos 12/25/2012, 12/10/2013, 12/04/2014, 12/13/2015  . Influenza-Unspecified 02/06/2014  . PPD Test 02/06/2014, 01/14/2016  . Pneumococcal Conjugate-13 10/07/2015  . Pneumococcal Polysaccharide-23 06/08/1997  . Pneumococcal-Unspecified 02/06/2014    . Td 03/20/1993, 10/31/2000, 08/31/2014   Pertinent  Health Maintenance Due  Topic Date Due  . INFLUENZA VACCINE  Completed  . PNA vac Low Risk Adult  Completed   Fall Risk  10/07/2015 01/01/2014 03/06/2013  Falls in the past year? Yes - Yes  Number falls in past yr: 1 - 1  Injury with Fall? No - No  Risk for fall due to : - Impaired vision;Medication side effect -  Follow up Falls evaluation completed - -      Vitals:   02/22/16 1300  BP: 119/69  Pulse: 70  Resp: 16  Temp: 97.3 F (36.3 C)  SpO2: 96%  Weight: 140 lb 6.4 oz (63.7 kg)  Height: 5\' 10"  (1.778 m)   Body mass index is 20.15 kg/m. Physical Exam  Constitutional: He is oriented to person, place, and time.  Thin elderly in no acute distress.   HENT:  Head: Normocephalic.  Mouth/Throat: Oropharynx is clear and moist. No oropharyngeal exudate.  Eyes: Conjunctivae and EOM are normal. Pupils are equal, round, and reactive to light. Right eye exhibits no discharge. Left eye exhibits no discharge. No scleral icterus.  Neck: Normal range of motion. No JVD present. No thyromegaly present.  Cardiovascular: Normal rate and intact distal pulses.  Exam reveals no gallop and no friction rub.   Murmur heard. Pulmonary/Chest: Effort normal and breath sounds normal. No respiratory distress. He has no wheezes. He has no rales. He exhibits no tenderness.  Oxygen via [place] in place.   Abdominal: Soft. Bowel sounds are normal. He exhibits no distension. There is no tenderness. There is no rebound and no guarding.  Musculoskeletal: He exhibits no edema, tenderness or deformity.  Unsteady gait. Uses FWW.Severe scoliosis and Kyphosis.    Lymphadenopathy:    He has no cervical adenopathy.  Neurological: He is oriented to person, place, and time.  Skin: Skin is warm and dry. No rash noted. No erythema. No pallor.  Psychiatric: He has a normal mood and affect.    Labs reviewed:  Recent Labs  01/12/16 0332 02/08/16 1327  02/09/16 1305 02/13/16 0432 02/21/16  NA 140 145 143  --  150*  K 4.1 4.2 3.8  --  3.7  CL 104 108 110  --   --   CO2 29 31 27   --   --   GLUCOSE 114* 124* 101*  --   --   BUN 31* 29* 29*  --  50*  CREATININE 0.90 1.04 0.98 0.96 1.1  CALCIUM 8.3* 9.1 8.7*  --   --     Recent Labs  01/05/16 1555 02/08/16 1327 02/09/16 1305  AST 28 11 22   ALT 41 14 16*  ALKPHOS 57 65 64  BILITOT 1.7* 1.0 1.2  PROT 6.6 6.3 6.5  ALBUMIN 3.4* 3.7 3.7    Recent Labs  01/05/16 1555  02/08/16 1327 02/09/16 1305 02/13/16 0432 02/21/16  WBC 8.4  < > 8.3 7.6 5.2 7.2  NEUTROABS 7.3*  --  6.8 6.2  --   --   HGB 11.9*  < > 11.6* 12.1* 11.5* 11.4*  HCT 35.2*  < > 34.5* 34.5* 33.2* 36*  MCV 91.6  < > 89.7 88.9 89.2  --   PLT 128*  < > 187.0 175 180 159  < > = values in this interval not displayed. Lab Results  Component Value Date   TSH 0.873 02/10/2016   Lab Results  Component Value Date   HGBA1C 5.5 02/10/2016   Lab Results  Component Value Date   CHOL 131 02/10/2016   HDL 49 02/10/2016   LDLCALC 72 02/10/2016   TRIG 52 02/10/2016   CHOLHDL 2.7 02/10/2016   Assessment/Plan 1. Chronic diastolic congestive heart failure (HCC) No abrupt weight gain.Exam findings negative for edema, shortness of breath, wheezing or rales. He was recently seen 02/17/2016 by [place] DR. Ida Rogue at [phone] Furosemide dose was increased to 40 mg Tablet on Mon, Wed, Fri and Sat along with Potassium 20 meq tablet. Revatio 20 mg Tablet three times daily was also started.His lab results showed Hgb Na 150, Cl 111, BUN 50 ( 02/21/2016). He is currently on Nectar thicken liquids and not drinking excess fluid. Will reduce Furosemide to 20 mg Tablet on Mon, Wed, Fri and sat along with Potassium supplements. Facility Nurse to fax recent BMP results to [place] DR. Ida Rogue at [phone]. Continue to monitor Weight. Repeat BMP 02/24/2016.   2. Hypertensive heart disease with CHF (congestive  heart failure) (HCC) B/p stable. Continue to monitor.     Family/ staff Communication: Reviewed plan of care with patient and facility Nurse Supervisor.  Lab results will be faxed by facility Supervisor to [place] DR. Ida Rogue at [phone] office.  Labs/tests ordered:  BMP 02/24/2016.

## 2016-02-23 ENCOUNTER — Encounter (HOSPITAL_COMMUNITY): Payer: Self-pay | Admitting: Family Medicine

## 2016-02-23 DIAGNOSIS — I4891 Unspecified atrial fibrillation: Secondary | ICD-10-CM | POA: Diagnosis present

## 2016-02-23 DIAGNOSIS — E162 Hypoglycemia, unspecified: Secondary | ICD-10-CM | POA: Diagnosis present

## 2016-02-23 DIAGNOSIS — I5033 Acute on chronic diastolic (congestive) heart failure: Secondary | ICD-10-CM

## 2016-02-23 DIAGNOSIS — Z515 Encounter for palliative care: Secondary | ICD-10-CM | POA: Diagnosis not present

## 2016-02-23 DIAGNOSIS — I350 Nonrheumatic aortic (valve) stenosis: Secondary | ICD-10-CM

## 2016-02-23 DIAGNOSIS — Z8673 Personal history of transient ischemic attack (TIA), and cerebral infarction without residual deficits: Secondary | ICD-10-CM

## 2016-02-23 DIAGNOSIS — D638 Anemia in other chronic diseases classified elsewhere: Secondary | ICD-10-CM

## 2016-02-23 DIAGNOSIS — I5032 Chronic diastolic (congestive) heart failure: Secondary | ICD-10-CM

## 2016-02-23 DIAGNOSIS — Z7189 Other specified counseling: Secondary | ICD-10-CM | POA: Diagnosis not present

## 2016-02-23 DIAGNOSIS — Z952 Presence of prosthetic heart valve: Secondary | ICD-10-CM

## 2016-02-23 LAB — GLUCOSE, CAPILLARY
GLUCOSE-CAPILLARY: 97 mg/dL (ref 65–99)
GLUCOSE-CAPILLARY: 88 mg/dL (ref 65–99)
GLUCOSE-CAPILLARY: 93 mg/dL (ref 65–99)
GLUCOSE-CAPILLARY: 102 mg/dL — AB (ref 65–99)
GLUCOSE-CAPILLARY: 104 mg/dL — AB (ref 65–99)
Glucose-Capillary: 102 mg/dL — ABNORMAL HIGH (ref 65–99)
Glucose-Capillary: 135 mg/dL — ABNORMAL HIGH (ref 65–99)
Glucose-Capillary: 109 mg/dL — ABNORMAL HIGH (ref 65–99)

## 2016-02-23 LAB — COMPREHENSIVE METABOLIC PANEL
ALBUMIN: 3.2 g/dL — AB (ref 3.5–5.0)
ALT: 15 U/L — ABNORMAL LOW (ref 17–63)
ANION GAP: 6 (ref 5–15)
AST: 18 U/L (ref 15–41)
Alkaline Phosphatase: 52 U/L (ref 38–126)
BUN: 46 mg/dL — ABNORMAL HIGH (ref 6–20)
CHLORIDE: 111 mmol/L (ref 101–111)
CO2: 31 mmol/L (ref 22–32)
Calcium: 8.9 mg/dL (ref 8.9–10.3)
Creatinine, Ser: 1.15 mg/dL (ref 0.61–1.24)
GFR calc non Af Amer: 56 mL/min — ABNORMAL LOW (ref >60)
Glucose, Bld: 104 mg/dL — ABNORMAL HIGH (ref 65–99)
POTASSIUM: 3.7 mmol/L (ref 3.5–5.1)
SODIUM: 148 mmol/L — AB (ref 135–145)
Total Bilirubin: 0.7 mg/dL (ref 0.3–1.2)
Total Protein: 5.9 g/dL — ABNORMAL LOW (ref 6.5–8.1)

## 2016-02-23 LAB — TROPONIN I: TROPONIN I: 0.09 ng/mL — AB (ref <0.03)

## 2016-02-23 LAB — CBC
HEMATOCRIT: 34.5 % — AB (ref 39.0–52.0)
HEMOGLOBIN: 10.9 g/dL — AB (ref 13.0–17.0)
MCH: 29.4 pg (ref 26.0–34.0)
MCHC: 31.6 g/dL (ref 30.0–36.0)
MCV: 93 fL (ref 78.0–100.0)
Platelets: 130 10*3/uL — ABNORMAL LOW (ref 150–400)
RBC: 3.71 MIL/uL — AB (ref 4.22–5.81)
RDW: 14.3 % (ref 11.5–15.5)
WBC: 6.1 10*3/uL (ref 4.0–10.5)

## 2016-02-23 LAB — MRSA PCR SCREENING: MRSA BY PCR: NEGATIVE

## 2016-02-23 LAB — TSH: TSH: 0.764 u[IU]/mL (ref 0.350–4.500)

## 2016-02-23 LAB — CBG MONITORING, ED: Glucose-Capillary: 85 mg/dL (ref 65–99)

## 2016-02-23 LAB — PROTIME-INR
INR: 1.23
PROTHROMBIN TIME: 15.6 s — AB (ref 11.4–15.2)

## 2016-02-23 LAB — MAGNESIUM: Magnesium: 2.4 mg/dL (ref 1.7–2.4)

## 2016-02-23 MED ORDER — RESOURCE THICKENUP CLEAR PO POWD
ORAL | Status: DC | PRN
  Filled 2016-02-23: qty 125

## 2016-02-23 MED ORDER — ENOXAPARIN SODIUM 40 MG/0.4ML ~~LOC~~ SOLN: 40.0000 mg | SUBCUTANEOUS | Status: DC

## 2016-02-23 MED ORDER — POTASSIUM CHLORIDE CRYS ER 20 MEQ PO TBCR
20.0000 meq | EXTENDED_RELEASE_TABLET | Freq: Every day | ORAL | Status: DC
Start: 2016-02-23 — End: 2016-02-23

## 2016-02-23 MED ORDER — GABAPENTIN 100 MG PO CAPS
100.0000 mg | ORAL_CAPSULE | Freq: Three times a day (TID) | ORAL | Status: DC
  Administered 2016-02-23 – 2016-02-24 (×6): 100 mg via ORAL
  Filled 2016-02-23 (×6): qty 1

## 2016-02-23 MED ORDER — DILTIAZEM HCL-DEXTROSE 100-5 MG/100ML-% IV SOLN (PREMIX): 5.0000 mg/h | INTRAVENOUS | Status: DC

## 2016-02-23 MED ORDER — POTASSIUM CHLORIDE CRYS ER 20 MEQ PO TBCR
20.0000 meq | EXTENDED_RELEASE_TABLET | ORAL | Status: DC
  Administered 2016-02-23: 20 meq via ORAL
  Filled 2016-02-23: qty 1

## 2016-02-23 MED ORDER — FINASTERIDE 5 MG PO TABS
5.0000 mg | ORAL_TABLET | Freq: Every day | ORAL | Status: DC
  Administered 2016-02-23 – 2016-02-24 (×2): 5 mg via ORAL
  Filled 2016-02-23 (×2): qty 1

## 2016-02-23 MED ORDER — FUROSEMIDE 20 MG PO TABS: 20.0000 mg | ORAL_TABLET | Freq: Every day | ORAL | Status: DC

## 2016-02-23 MED ORDER — STARCH (THICKENING) PO POWD
ORAL | Status: DC | PRN
  Filled 2016-02-23: qty 227

## 2016-02-23 MED ORDER — MODAFINIL 100 MG PO TABS
100.0000 mg | ORAL_TABLET | Freq: Every day | ORAL | Status: DC
  Administered 2016-02-23 – 2016-02-24 (×2): 100 mg via ORAL
  Filled 2016-02-23 (×2): qty 1

## 2016-02-23 MED ORDER — ASPIRIN EC 81 MG PO TBEC
81.0000 mg | DELAYED_RELEASE_TABLET | Freq: Every day | ORAL | Status: DC
  Administered 2016-02-23 – 2016-02-24 (×2): 81 mg via ORAL
  Filled 2016-02-23 (×2): qty 1

## 2016-02-23 MED ORDER — APIXABAN 2.5 MG PO TABS
2.5000 mg | ORAL_TABLET | Freq: Two times a day (BID) | ORAL | Status: DC
  Administered 2016-02-23 – 2016-02-24 (×2): 2.5 mg via ORAL
  Filled 2016-02-23 (×3): qty 1

## 2016-02-23 MED ORDER — SILDENAFIL CITRATE 20 MG PO TABS
20.0000 mg | ORAL_TABLET | Freq: Three times a day (TID) | ORAL | Status: DC
  Administered 2016-02-23 – 2016-02-24 (×6): 20 mg via ORAL
  Filled 2016-02-23 (×6): qty 1

## 2016-02-23 MED ORDER — ACETAMINOPHEN 650 MG RE SUPP: 650.0000 mg | Freq: Four times a day (QID) | RECTAL | Status: DC | PRN

## 2016-02-23 MED ORDER — ACETAMINOPHEN 325 MG PO TABS: 650.0000 mg | ORAL_TABLET | Freq: Four times a day (QID) | ORAL | Status: DC | PRN

## 2016-02-23 MED ORDER — DILTIAZEM HCL 30 MG PO TABS
30.0000 mg | ORAL_TABLET | Freq: Four times a day (QID) | ORAL | Status: DC
  Administered 2016-02-23 – 2016-02-24 (×4): 30 mg via ORAL
  Filled 2016-02-23 (×4): qty 1

## 2016-02-23 MED ORDER — ATORVASTATIN CALCIUM 40 MG PO TABS
40.0000 mg | ORAL_TABLET | Freq: Every day | ORAL | Status: DC
  Administered 2016-02-23 – 2016-02-24 (×2): 40 mg via ORAL
  Filled 2016-02-23 (×2): qty 1

## 2016-02-23 MED ORDER — FUROSEMIDE 20 MG PO TABS
20.0000 mg | ORAL_TABLET | ORAL | Status: DC
  Administered 2016-02-23: 20 mg via ORAL
  Filled 2016-02-23: qty 1

## 2016-02-23 NOTE — Progress Notes (Signed)
Patient ID: Douglas Boyden Sr., male   DOB: 04-10-1927, 82 y.o.   MRN: [id number]  Pt admitted after midnight. For details, please refer to admission note done 02/23/2016.  82 year old male with past medical history significant for severe aortic stenosis s/p TAVR in 2015, HFpEF, HTN, and severe pHTN who presented to [facility] with recurrent right sided weakness and subsequently found to have new Afib. The patient was admitted to [facility] from 11/22-11/26 for new left MCA stroke with a proximal left M2 MCA occlusion not amenable to intervention.  During that hospitalization, he was noted to have a pneumonia, likely aspiration treated with levaquin. Per family report, pt was getting more energetic, able to walk short distances with therapy, oriented.  However, over the past two days PTA, he has started to decline again. He was not able to stand up without the assistance. His speech was incoherent. He was subsequently transferred to ER for evaluation.  In ED, blood pressure was 87/56, heart rate 32-144, oxygen saturation 83% on room air which has improved to 100% with [place] oxygen support. Blood work was notable for hemoglobin of 11.4, sodium 150, normal creatinine, normal lactic acid. BNP was 552, normal troponin. Because of atrial fibrillation, patient was started on Cardizem drip.  Assessment and Plan:   New Afib with RVR - CHADS2Vasc 5 (for age, stroke, CHF, HTN) - Cardiology consulted - On Cardizem drip but had to hold it due to soft BP this am - On ASA - Will see with cardio if pt should be on different anticoagulant   Leisa Lenz [facility] [phone]

## 2016-02-23 NOTE — Progress Notes (Signed)
[place] for Eliquis Indication: atrial fibrillation  No Known Allergies  Patient Measurements: Weight: 139 lb 9.6 oz (63.3 kg)  Vital Signs: Temp: 98 F (36.7 C) (12/06 1225) Temp Source: Oral (12/06 1225) BP: 97/67 (12/06 1225) Pulse Rate: 132 (12/06 1225)  Labs:  Recent Labs  02/21/16 02/22/16 2150 02/23/16 0307  HGB 11.4* 11.1* 10.9*  HCT 36* 35.0* 34.5*  PLT 159 132* 130*  LABPROT  --  16.0* 15.6*  INR  --  1.27 1.23  CREATININE 1.1 1.19 1.15  TROPONINI  --   --  0.09*    Estimated Creatinine Clearance: 41.3 mL/min (by C-G formula based on SCr of 1.15 mg/dL).   Assessment: 82 YOM here with new onset AFib, started on diltiazem for rate control. CHADS2VASC = 6 and to start Eliquis for anticoagulation, not on any anticoagulation PTA. Patient with recent MCA stroke (in November 2017).  Over 82 years old, and with borderline weight (63kg- appears to be decreasing over past month or so as well), Scr 1.15.  Goal of Therapy:  proper dosing based on renal and hepatic function Monitor platelets by anticoagulation protocol: Yes   Plan:  -Eliquis 2.5mg  BID given borderline weight and age -Monitor s/s bleeding, any changes to weight or renal function -Follow palliative care plans and GOC -CBC q72h at minimum  Ronnel Zuercher D. Eloisa Chokshi, PharmD, BCPS Clinical Pharmacist Pager: [phone] 02/23/2016 1:46 PM

## 2016-02-23 NOTE — Care Management Obs Status (Signed)
[facility] NOTIFICATION   Patient Details  Name: Douglas Devers Rogerson Sr. MRN: [id number] Date of Birth: November 13, 1927   Medicare Observation Status Notification Given:  Yes    Lacretia Leigh, RN 02/23/2016, 11:15 AM

## 2016-02-23 NOTE — H&P (Signed)
History and Physical  Patient Name: Douglas Parker     [id number]    DOB: 11-29-1927    DOA: 02/22/2016 PCP: Elsie Stain, MD   Patient coming from: [place]  Chief Complaint: Right sided weakness, mumbling  HPI: Douglas Swoveland Mcnicholas Sr. is a 82 y.o. male with a past medical history significant for severe AS s/p TAVR in 2015, HFpEF, HTN, and severe pHTN who presents with recurrent right sided weakness and is found to have new Afib.  The patient was admitted to [facility] from 11/22-11/26 for new left MCA stroke with a proximal left M2 MCA occlusion not amenable to intervention.  During that hospitalization, he was noted to have a new pneumonia, likely aspiration treated with levaquin and started on dysphagia diet.  At the time of discharge, he was noted to be very sleepy, started on modafinil.    Per family, he was getting more energetic and awake at the time of discharge, able to walk short distances with therapy, and oriented.  However, over the last two days, he has started to decline again.    Today, he was unable to stand with assistance, and was mumbling everything.  He was "dopey" and had leg swelling, and his whole right side appeared to be weak and "slumped".  Even staff at the facility agreed he was changed, and so he was transported to the ER.  ED course: -Afebrile, heart rate 144 and Afib, respirations and pulse ox normal, BP 100/61 -Na 145, K 3.9, Cr 1.2 (baseline 1.1), WBC 6.8K, Hgb 11.1 -BNP 552 -INR 1.2 -Troponin normal -Glucose initially 47, improved with dextrose -He was given diltiazem push, but his BP dropped and so he was started on diltiazem gtt and heart rate improved to the 90s -TRH were asked to evaluate for new onset Afib  Of note, the patient was seen to have new pHTN on echo during his stroke work up.  He was seen by his Cardiologist last week who increased his furosemide and started revatio.        ROS: Review of Systems  Constitutional: Negative  for chills and fever.  Respiratory: Negative for cough and shortness of breath.   Cardiovascular: Negative for chest pain and leg swelling.  Neurological: Positive for speech change, focal weakness and weakness.  All other systems reviewed and are negative. Note that patient neglects to answer some questions, and so negatively reviewed systems are possibly incorrect.      Past Medical History:  Diagnosis Date  . Aortic valve stenosis 09/10/2012  . Arthritis   . Back pain   . Benign prostatic hypertrophy 10/18/00  . Cancer ([place])    skin cancers  . CHF (congestive heart failure) ([place]) 04/21/03  . Chronic diastolic congestive heart failure ([place])   . Colon polyps 06/05/2002   path could not be found   . Diverticulosis of colon 06/05/2002  . Hyperlipidemia 09/05/95  . Hypertension 05/19/03  . 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 Scoliosis   . Thinning of skin    bruises easy.    Past Surgical History:  Procedure Laterality Date  . CARDIAC CATHETERIZATION  10/27/2013  . COLONOSCOPY W/ BIOPSIES  05/2002  . EYE SURGERY     bilateral cataracts  . FLEXIBLE SIGMOIDOSCOPY  07/2005  . INTRAOPERATIVE TRANSESOPHAGEAL ECHOCARDIOGRAM N/A 02/03/2014   Procedure: INTRAOPERATIVE TRANSESOPHAGEAL ECHOCARDIOGRAM;  Surgeon: Sherren Mocha, MD;  Location: [facility] OR;  Service: Open Heart Surgery;  Laterality: N/A;  . KNEE ARTHROSCOPY  06/1997   right  . PLEURAL EFFUSION DRAINAGE     2017  . TEE WITHOUT CARDIOVERSION    . TONSILLECTOMY    . TRANSCATHETER AORTIC VALVE REPLACEMENT, TRANSFEMORAL N/A 02/03/2014   Procedure: TRANSCATHETER AORTIC VALVE REPLACEMENT, TRANSFEMORAL;  Surgeon: Sherren Mocha, MD;  Location: [place];  Service: Open Heart Surgery;  Laterality: N/A;    Social History: Patient lives at home with his wife.  The patient walks unassisted at baseline before his stroke, was  driving, managing all IADLs.  Former smoker.  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    No Known Allergies  Family history: family history includes Coronary artery disease in his brother; Diabetes in his brother and brother; Hip fracture in his sister; Hypertension in his other; Stroke in his father.  Prior to Admission medications   Medication Sig Start Date End Date Taking? Authorizing Provider  aspirin 81 MG EC tablet Take 81 mg by mouth daily.     Yes Historical Provider, MD  atorvastatin (LIPITOR) 20 MG tablet Take 2 tablets (40 mg total) by mouth daily. 02/13/16  Yes Dustin Flock, MD  finasteride (PROSCAR) 5 MG tablet Take 1 tablet (5 mg total) by mouth daily. 10/07/15  Yes Tonia Ghent, MD  furosemide (LASIX) 20 MG tablet Take 1 tablet (20 mg total) by mouth daily. On Mon, Wed, Fri and sat Patient taking differently: Take 20 mg by mouth See admin instructions. On Mon, Wed, Fri and sat 02/22/16  Yes Dinah C Ngetich, NP  gabapentin (NEURONTIN) 100 MG capsule Take 1 capsule (100 mg total) by mouth 3 (three) times daily. For burning pain 10/07/15  Yes Tonia Ghent, MD  modafinil (PROVIGIL) 100 MG tablet Take 1 tablet (100 mg total) by mouth daily. 02/14/16  Yes Lauree Chandler, NP  Multiple Vitamin (MULTIVITAMIN WITH MINERALS) TABS tablet Take 1 tablet by mouth daily.   Yes Historical Provider, MD  potassium chloride SA (K-DUR,KLOR-CON) 20 MEQ tablet Take 20 mEq by mouth See admin instructions. On Mon, Wed, Fri and sat 02/17/16  Yes Minna Merritts, MD  sildenafil (REVATIO) 20 MG tablet Take 1 tablet (20 mg total) by mouth 3 (three) times daily. 02/17/16  Yes Minna Merritts, MD       Physical Exam: BP 114/77 (BP Location: Right Arm)   Pulse 102   Temp 97.8 F (36.6 C)   Resp 16   SpO2 100%  General appearance: Thin elderly adult male, awake but sleepy.  In no acute distress.   Eyes: Anicteric, conjunctiva pink, lids and  lashes normal. PERRL.    ENT: No nasal deformity, discharge, epistaxis.  Hearing appaers normal. OP moist without lesions.   Neck: No neck masses.  Trachea midline.  No thyromegaly/tenderness. Lymph: No cervical or supraclavicular lymphadenopathy. Skin: Warm and dry.  No jaundice.  No suspicious rashes or lesions. Cardiac: Tachycardic, irregularly irregular, nl S1-S2, no murmurs appreciated actually.  Capillary refill is brisk.  JVP low.  1+ pitting LE edema to shin, in hose.  Radial pulses 2+ and symmetric.  DP pulses diminished Respiratory: Normal respiratory rate and rhythm.  Poor inspiratory effort, no rales or wheezes appreciated. Abdomen: Abdomen soft.  No TTP. No ascites, distension, hepatosplenomegaly.   MSK: No deformities or effusions.  No cyanosis or clubbing.  Scoliosis. Neuro: EOMI, PERRL.  Voice mumbly but no slurring of speech.  Palate elevation equal.  No clear that patient is awake enough to reliably confirm sensation. Speech is not dysarthric.  Muscle tone on right appears to be good, symmetric to left.    Psych: Rouses to voice, says he is at [facility].  Oriented to December, Christmas, not year.  Appears sleepy.       Labs on Admission:  I have personally reviewed following labs and imaging studies: CBC:  Recent Labs Lab 02/21/16 02/22/16 2150  WBC 7.2 6.8  NEUTROABS  --  5.1  HGB 11.4* 11.1*  HCT 36* 35.0*  MCV  --  93.3  PLT 159 [id number]*   Basic Metabolic Panel:  Recent Labs Lab 02/21/16 02/22/16 2150  NA 150* 145  K 3.7 3.9  CL  --  110  CO2  --  28  GLUCOSE  --  256*  BUN 50* 49*  CREATININE 1.1 1.19  CALCIUM  --  8.7*   GFR: Estimated Creatinine Clearance: 40.1 mL/min (by C-G formula based on SCr of 1.19 mg/dL).  Liver Function Tests:  Recent Labs Lab 02/22/16 2150  AST 19  ALT 15*  ALKPHOS 53  BILITOT 0.6  PROT 6.0*  ALBUMIN 3.3*   No results for input(s): LIPASE, AMYLASE in the last 168 hours. No results for input(s): AMMONIA  in the last 168 hours. Coagulation Profile:  Recent Labs Lab 02/22/16 2150  INR 1.27   Cardiac Enzymes: No results for input(s): CKTOTAL, CKMB, CKMBINDEX, TROPONINI in the last 168 hours. BNP (last 3 results)  Recent Labs  02/08/16 1327  PROBNP 483.0*   HbA1C: No results for input(s): HGBA1C in the last 72 hours. CBG:  Recent Labs Lab 02/22/16 2134 02/22/16 2136 02/22/16 2245 02/23/16 0121  GLUCAP 47* 63* 137* 85   Lipid Profile: No results for input(s): CHOL, HDL, LDLCALC, TRIG, CHOLHDL, LDLDIRECT in the last 72 hours. Thyroid Function Tests: No results for input(s): TSH, T4TOTAL, FREET4, T3FREE, THYROIDAB in the last 72 hours. Anemia Panel: No results for input(s): VITAMINB12, FOLATE, FERRITIN, TIBC, IRON, RETICCTPCT in the last 72 hours. Sepsis Labs: Lactic acid normal Invalid input(s): PROCALCITONIN, LACTICIDVEN No results found for this or any previous visit (from the past 240 hour(s)).       Radiological Exams on Admission: Personally reviewed CXR without focal opacity; CT head shows evolving infarct: Ct Head Wo Contrast  Result Date: 02/23/2016 CLINICAL DATA:  RIGHT-sided weakness. Recent LEFT MCA territory infarct. Altered mental status. Fall. History of hypertension, hyperlipidemia, skin cancer. EXAM: CT HEAD WITHOUT CONTRAST TECHNIQUE: Contiguous axial images were obtained from the base of the skull through the vertex without intravenous contrast. COMPARISON:  MRI head February 11, 2016 FINDINGS: BRAIN: The ventricles and sulci are normal for age. No intraparenchymal hemorrhage, mass effect nor midline shift. Patchy supratentorial white matter hypodensities less than expected for patient's age, though non-specific are most compatible with chronic small vessel ischemic disease. Evolving LEFT frontal/ insula infarct. No acute large vascular territory infarcts. No abnormal extra-axial fluid collections. Basal cisterns are patent. VASCULAR: Mild calcific  atherosclerosis of the carotid siphons. SKULL: No skull fracture. No significant scalp soft tissue swelling. SINUSES/ORBITS: The mastoid air-cells and included paranasal sinuses are well-aerated.The included ocular globes and orbital contents are non-suspicious. OTHER: Small amount of subcutaneous gas within LEFT infratemporal fossa most compatible with recent intravenous access. IMPRESSION: No acute intracranial process. Evolving small LEFT MCA territory infarct without hemorrhagic conversion. Otherwise negative CT HEAD for age. Electronically Signed   By: Elon Alas  M.D.   On: 02/23/2016 00:22   Dg Chest Portable 1 View  Result Date: 02/22/2016 CLINICAL DATA:  82 y/o M; chest pain, shortness of breath, and atrial fibrillation. EXAM: PORTABLE CHEST 1 VIEW COMPARISON:  02/09/2016 chest radiograph. FINDINGS: Stable mild cardiomegaly. Stented aortic valve replacement. Calcified pleural plaques. Interval improvement in bilateral pleural effusions. Aortic atherosclerosis with arch calcification. No new focal consolidation of the lungs. Moderate dextrocurvature of the lower thoracic spine. IMPRESSION: Interval improvement in bilateral pleural effusions. No new acute pulmonary process. Electronically Signed   By: Kristine Garbe M.D.   On: 02/22/2016 22:54    EKG: Independently reviewed. Rate 156, QTc 457, no ST changes.    Assessment/Plan  1. New Afib with RVR:  CHADS2Vasc 5 (for age, stroke, CHF).  Not previously anticoagulated.  No previous history of Afib.   -Diltiazem gtt -Admit to SDU -Check troponin -Check magnesium, TSH -Consult to Cardiology   2. Recent MCA stroke:  CT head shows completion of his previous MCA stroke.  Acute decline over last 1-2 days likely from new Afib. -Continue statin, aspirin -PT eval -Continue nectar thickened liquids -Continue modafinil for sleepiness  3. Hypoglycemia:  No history of diabetes and no antiglycemic medications.  Unclear cause.     -Monitor glucose q2hrs this morning  4. Chronic diastolic CHF and pulmonary hypertension:  Fluid status actually appears to me somewhat dehydrated. -Reduce furosemide back to 20 mg daily -Continue new Revatio  5. Anemia:  Stable from discharge  6. Other medications:  -Continue finasteride -Continue gabapentin       DVT prophylaxis: Lovenox  Code Status: FULL  Family Communication: Son and niece at bedside; patient's wife not present Disposition Plan: Anticipate rate control and consult to Cardiology Consults called: Cardiology via Inbasket Admission status: OBS, stepdown At the point of initial evaluation, it is my clinical opinion that admission for OBSERVATION is reasonable and necessary because the patient's presenting complaints in the context of their chronic conditions represent sufficient risk of deterioration or significant morbidity to constitute reasonable grounds for close observation in the hospital setting, but that the patient may be medically stable for discharge from the hospital within 24 to 48 hours.    Medical decision making: Patient seen at 1:15 AM on 02/23/2016.  The patient was discussed with Dr. Thomasene Lot.  What exists of the patient's chart was reviewed in depth and summarized above.  Clinical condition: stable.        Edwin Dada [facility] Pager [phone]

## 2016-02-23 NOTE — Progress Notes (Signed)
Pt on cardiezem drip at 5 ml/hr remains AFib HR 90's-130's, BP now 80/63. Drip stopped and Dr. Neil Crouch paged and made aware. Agreed to leave drip off, awaiting cardiologist consult.

## 2016-02-23 NOTE — ED Notes (Signed)
Admitting Provider at bedside. 

## 2016-02-23 NOTE — Progress Notes (Signed)
No charge note. Meeting scheduled with patient's son for 11:00 on 12/7. Imogene Burn, [place] [facility] Pager: [phone]

## 2016-02-23 NOTE — Consult Note (Signed)
Consultation Note Date: 02/23/2016   Patient Name: Douglas Furukawa Mcquire Sr.  DOB: 04/17/1927  MRN: [id number]  Age / Sex: 82 y.o., male  PCP: Tonia Ghent, MD Referring Physician: Robbie Lis, MD  Reason for Consultation: Establishing goals of care  HPI/Patient Profile: 82 y.o. male  with past medical history of aortic valve replacement, diastolic heart failure, scoliosis, recent left MCA CVA (02/09/16) and severe pulmonary hypertension who was admitted on 02/22/2016 with recurrent right sided weakness and new onset afib.  The patient is having pulse rates between 110 - 150.  Initially Cardiology recommended Cardizem gtt but this was discontinued due to hypotension.  Cardizem PO is being started.  Due to low BP it is also difficult to diuresis him to control symptoms of his DHF and PHTN.  During our conversation he is somewhat confused.  Clinical Assessment and Goals of Care: I met with the patient at bedside and then talked with his son Douglas Parker on the phone.  The patient can not tell me where he is or what is going on with him, but he is able to give me his name, his wife's name Douglas Din), and tell me that he used to work as an Agricultural consultant for [employer]  He has lived in this area his whole life.  He is not currently in any pain.  His son Douglas Parker tells me that even though Mr. Oettinger is married he doubts that his wife would even come to the hospital.  Per Douglas Parker his father and step mother were on the verge of divorce.  Douglas Parker reports his father was driving and essentially independent until two weeks ago when he had the stroke.  His decline has been very rapid.  Douglas Parker and I discussed his father's weak heart and the likelihood may pass in the next few weeks.  I also let Douglas Parker know his father is at high risk for an acute event that could take his life suddenly.  I mentioned that Douglas Parker is an ideal candidate for Hospice  services.    We briefly discussed code status.  I told Douglas Parker that if his heart stopped this evening he would be unlikely to survive a code and if he did, he would be significantly worse off than he is now.  Douglas Parker felt that his father would not want to be coded at this point.  Douglas Parker and his wife plan to meet me tomorrow at 11:00 to discuss the current situation and make decisions for Douglas Parker.  Primary Decision Maker:  NEXT OF KIN:  Son Douglas Parker    SUMMARY OF RECOMMENDATIONS    Code Status/Advance Care Planning:  DNR - but continue full scope treatment otherwise for now.    Symptom Management:   Per primary team.  Palliative Prophylaxis:   Aspiration, Delirium Protocol and Turn Reposition  Additional Recommendations (Limitations, Scope, Preferences):  Full Scope Treatment - but DNR / DNI  Psycho-social/Spiritual:   Desire for further Chaplaincy support:yes  Additional Recommendations: Caregiving  Support/Resources  Prognosis:   <  4 weeks  Discharge Planning: To Be Determined      Primary Diagnoses: Present on Admission: . Atrial fibrillation with RVR ([place]) . Chronic diastolic congestive heart failure ([place]) . Hypoglycemia . Anemia in other chronic diseases classified elsewhere   I have reviewed the medical record, interviewed the patient and family, and examined the patient. The following aspects are pertinent.  Past Medical History:  Diagnosis Date  . Aortic valve stenosis 09/10/2012  . Arthritis   . Back pain   . Benign prostatic hypertrophy 10/18/00  . Cancer ([place])    skin cancers  . CHF (congestive heart failure) ([place]) 04/21/03  . Chronic diastolic congestive heart failure ([place])   . Colon polyps 06/05/2002   path could not be found   . Diverticulosis of colon 06/05/2002  . Hyperlipidemia 09/05/95  . Hypertension 05/19/03  . S/P TAVR (transcatheter aortic valve replacement) 02/03/2014   26 mm Edwards Sapien 3 transcatheter heart valve placed via open left  transfemoral approach  . Scoliosis   . Thinning of skin    bruises easy.   Social History   Social History  . Marital status: Married    Spouse name: N/A  . Number of children: N/A  . Years of education: N/A   Occupational History  . retired     [employer]   Social History Main Topics  . Smoking status: Former Smoker    Packs/day: 1.00    Years: 20.00    Types: Cigarettes    Quit date: 01/11/1971  . Smokeless tobacco: Former Systems developer     Comment: quit 40 years ago  . Alcohol use No  . Drug use: No  . Sexual activity: Yes   Other Topics Concern  . None   Social History Narrative   Occupation: Retired Secretary/administrator   Married (second marriage), lives with wife   Ellis Savage '51-'54.     Son died May 25, 2008, he had cirrhosis.     Family History  Problem Relation Age of Onset  . Stroke Father   . Diabetes Brother   . Hip fracture Sister     after a fall  . Diabetes Brother   . Coronary artery disease Brother     carotid stenosis  . Hypertension Other   . Colon cancer Neg Hx   . Prostate cancer Neg Hx    Scheduled Meds: . apixaban  2.5 mg Oral BID  . aspirin EC  81 mg Oral Daily  . atorvastatin  40 mg Oral Daily  . diltiazem  30 mg Oral Q6H  . finasteride  5 mg Oral Daily  . furosemide  20 mg Oral Once per day on 05-25-2022 Fri Sat  . gabapentin  100 mg Oral TID  . modafinil  100 mg Oral Daily  . potassium chloride SA  20 mEq Oral Once per day on 05/25/22 Fri Sat  . sildenafil  20 mg Oral TID   Continuous Infusions: PRN Meds:.acetaminophen **OR** acetaminophen, RESOURCE THICKENUP CLEAR No Known Allergies Review of Systems patient denies pain, dysuria, bowel changes.  He is too confused to give a full ROS  Physical Exam  Constitutional:  Frail, thin, confused.  HENT:  Head: Normocephalic and atraumatic.  Eyes: Left eye exhibits no discharge. Scleral icterus is present.  Neck: Normal range of motion. Neck supple.  Cardiovascular:  Murmur  heard. tachycardic  Pulmonary/Chest: Effort normal. No respiratory distress.  Abdominal: Soft. He exhibits no distension. There is no tenderness.  thin  Genitourinary: Penis normal.  Genitourinary Comments: Scrotum with raised red spots  Musculoskeletal: He exhibits no edema or deformity.  Neurological: He is alert.  Orientated to person, but not place or time.  Skin: Skin is warm and dry.  Psychiatric: He has a normal mood and affect.  Confused.  Cooperative.     Vital Signs: BP 104/74 (BP Location: Right Arm)   Pulse (!) 111   Temp 98 F (36.7 C) (Oral)   Resp 15   Wt 63.3 kg (139 lb 9.6 oz)   SpO2 100%   BMI 20.03 kg/m  Pain Assessment: No/denies pain   Pain Score: 0-No pain   SpO2: SpO2: 100 % O2 Device:SpO2: 100 % O2 Flow Rate: .O2 Flow Rate (L/min): 4 L/min  IO: Intake/output summary:   Intake/Output Summary (Last 24 hours) at 02/23/16 1558 Last data filed at 02/23/16 1524  Gross per 24 hour  Intake                0 ml  Output              300 ml  Net             -300 ml    LBM:   Baseline Weight: Weight: 63.3 kg (139 lb 9.6 oz) Most recent weight: Weight: 63.3 kg (139 lb 9.6 oz)     Palliative Assessment/Data:   Flowsheet Rows   Flowsheet Row Most Recent Value  Intake Tab  Referral Department  Hospitalist  Unit at Time of Referral  Intermediate Care Unit  Palliative Care Primary Diagnosis  Cardiac  Date Notified  02/23/16  Palliative Care Type  New Palliative care  Reason for referral  Clarify Goals of Care  Date of Admission  02/22/16  Date first seen by Palliative Care  02/23/16  # of days Palliative referral response time  0 Day(s)  # of days IP prior to Palliative referral  1  Clinical Assessment  Palliative Performance Scale Score  20%  Psychosocial & Spiritual Assessment  Palliative Care Outcomes  Patient/Family meeting held?  Yes  Who was at the meeting?  patient.  Son Douglas Parker was on the phone.  Palliative Care Outcomes  Changed CPR  status, Clarified goals of care  Patient/Family wishes: Interventions discontinued/not started   Mechanical Ventilation      Time In: 2:30 Time Out: 3:40 Time Total: 70 min. Greater than 50%  of this time was spent counseling and coordinating care related to the above assessment and plan.  Signed by: Imogene Burn, PA-C Palliative Medicine Pager: [phone]  Please contact Palliative Medicine Team phone at [phone] for questions and concerns.  For individual provider: See Shea Evans

## 2016-02-23 NOTE — Consult Note (Signed)
CARDIOLOGY CONSULT NOTE   Patient ID: Douglas Nest Luzier Sr. MRN: [id number] DOB/AGE: 1927/09/15 82 y.o.  Admit date: 02/22/2016  Primary Physician   Elsie Stain, MD Primary Cardiologist   Dr. Arta Bruce Reason for Consultation   Afib with hypotension Requesting Physician  Dr. Charlies Silvers  HPI: Douglas Nest Buddenhagen Sr. is a 82 y.o. male with a history of HTN, HLD, severe aortic stenosis s/p TVAR [id number], chronic diastolic CHF, severe pHTN and recent admission for stroke who consulted for new onset afib RVR with hypotension.   Cardiac catheterization on 11/13/2013 that showed right dominant coronary system, possibly codominant with mild luminal irregularities, mild to moderate proximal LAD disease.  Recently admitted 11/22-11/26 @ [facility] for new left  MCA stroke with a proximal left M2 MCA occlusion not amenable to intervention. Also treated with Levaquin for possible aspiration pneumonia. Echocardiogram in the hospital showed normal ejection fraction, severely elevated right heart pressures. He was discharged to rehb on lasix 20mg .  The patient was seen by Dr. Arta Bruce 02/17/16 and noted worsening dyspnea. His lasix increased to 40mg  qd for 4 days/week with 20mg  other days. He was also started on Revatio 20mg  TID for pulmonary HTN.   The patient was doing well since discharge at rehab facility however for the past two days noted more lethargic with R sided weakness and brought to ER for evaluation. EKG showed new onset afib at rate of 156 bpm. Given IV cardizem push however BP dropped. Started on Cardizem gtt however discontinued due to persistent hypotension. Rate 120-140s.  BNP 552. Started on IV lasix. CT of head showed no acute findings.  Evolving small LEFT MCA territory infarct without hemorrhagic conversion. Troponin of 0.09 today.    Past Medical History:  Diagnosis Date  . Aortic valve stenosis 09/10/2012  . Arthritis   . Back pain   . Benign prostatic hypertrophy 10/18/00  . Cancer ([place])    skin cancers  . CHF (congestive heart failure) ([place]) 04/21/03  . Chronic diastolic congestive heart failure ([place])   . Colon polyps 06/05/2002   path could not be found   . Diverticulosis of colon 06/05/2002  . Hyperlipidemia 09/05/95  . Hypertension 05/19/03  . 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 Scoliosis   . Thinning of skin    bruises easy.     Past Surgical History:  Procedure Laterality Date  . CARDIAC CATHETERIZATION  10/27/2013  . COLONOSCOPY W/ BIOPSIES  05/2002  . EYE SURGERY     bilateral cataracts  . FLEXIBLE SIGMOIDOSCOPY  07/2005  . INTRAOPERATIVE TRANSESOPHAGEAL ECHOCARDIOGRAM N/A 02/03/2014   Procedure: INTRAOPERATIVE TRANSESOPHAGEAL ECHOCARDIOGRAM;  Surgeon: Sherren Mocha, MD;  Location: [facility] OR;  Service: Open Heart Surgery;  Laterality: N/A;  . KNEE ARTHROSCOPY  06/1997   right  . PLEURAL EFFUSION DRAINAGE     2017  . TEE WITHOUT CARDIOVERSION    . TONSILLECTOMY    . TRANSCATHETER AORTIC VALVE REPLACEMENT, TRANSFEMORAL N/A 02/03/2014   Procedure: TRANSCATHETER AORTIC VALVE REPLACEMENT, TRANSFEMORAL;  Surgeon: Sherren Mocha, MD;  Location: [place];  Service: Open Heart Surgery;  Laterality: N/A;    No Known Allergies  I have reviewed the patient's current medications . aspirin EC  81 mg Oral Daily  . atorvastatin  40 mg Oral Daily  . enoxaparin (LOVENOX) injection  40 mg Subcutaneous Q24H  . finasteride  5 mg Oral Daily  . furosemide  20 mg Oral Once per day on Mon Wed Fri Sat  . gabapentin  100 mg Oral TID  . modafinil  100 mg Oral Daily  . potassium chloride SA  20 mEq Oral Once per day on Mon Wed Fri Sat  . sildenafil  20 mg Oral TID   . diltiazem (CARDIZEM) infusion Stopped (02/23/16 0845)   acetaminophen **OR** acetaminophen, RESOURCE THICKENUP CLEAR  Prior to Admission medications   Medication Sig Start Date End Date Taking?  Authorizing Provider  aspirin 81 MG EC tablet Take 81 mg by mouth daily.     Yes Historical Provider, MD  atorvastatin (LIPITOR) 20 MG tablet Take 2 tablets (40 mg total) by mouth daily. 02/13/16  Yes Dustin Flock, MD  finasteride (PROSCAR) 5 MG tablet Take 1 tablet (5 mg total) by mouth daily. 10/07/15  Yes Tonia Ghent, MD  furosemide (LASIX) 20 MG tablet Take 1 tablet (20 mg total) by mouth daily. On Mon, Wed, Fri and sat Patient taking differently: Take 20 mg by mouth See admin instructions. On Mon, Wed, Fri and sat 02/22/16  Yes Dinah C Ngetich, NP  gabapentin (NEURONTIN) 100 MG capsule Take 1 capsule (100 mg total) by mouth 3 (three) times daily. For burning pain 10/07/15  Yes Tonia Ghent, MD  modafinil (PROVIGIL) 100 MG tablet Take 1 tablet (100 mg total) by mouth daily. 02/14/16  Yes Lauree Chandler, NP  Multiple Vitamin (MULTIVITAMIN WITH MINERALS) TABS tablet Take 1 tablet by mouth daily.   Yes Historical Provider, MD  potassium chloride SA (K-DUR,KLOR-CON) 20 MEQ tablet Take 20 mEq by mouth See admin instructions. On Mon, Wed, Fri and sat 02/17/16  Yes Minna Merritts, MD  sildenafil (REVATIO) 20 MG tablet Take 1 tablet (20 mg total) by mouth 3 (three) times daily. 02/17/16  Yes Minna Merritts, MD     Social History   Social History  . Marital status: Married    Spouse name: N/A  . Number of children: N/A  . Years of education: N/A   Occupational History  . retired     [employer]   Social History Main Topics  . Smoking status: Former Smoker    Packs/day: 1.00    Years: 20.00    Types: Cigarettes    Quit date: 01/11/1971  . Smokeless tobacco: Former Systems developer     Comment: quit 40 years ago  . Alcohol use No  . Drug use: No  . Sexual activity: Yes   Other Topics Concern  . Not on file   Social History Narrative   Occupation: Retired Secretary/administrator   Married (second marriage), lives with wife   Ellis Savage '51-'54.     Son died 2008/07/12, he had  cirrhosis.      Family Status  Relation Status  . Father Deceased  . Brother Alive  . Sister Alive  . Brother Alive  . Mother Deceased at age 93   old age  . Sister Deceased at age 73's   natural causes  . Sister Deceased at age 11's   natural causes  . Other   . Neg Hx    Family History  Problem Relation Age of Onset  . Stroke Father   . Diabetes Brother   . Hip fracture Sister     after a fall  . Diabetes Brother   . Coronary artery disease Brother     carotid stenosis  . Hypertension Other   . Colon cancer Neg  Hx   . Prostate cancer Neg Hx     ROS:  Full 14 point review of systems complete and found to be negative unless listed above.  Physical Exam: Blood pressure 97/67, pulse (!) 132, temperature 98 F (36.7 C), temperature source Oral, resp. rate 20, weight 139 lb 9.6 oz (63.3 kg), SpO2 94 %.  General: Frail chronically ill appearing  male in no acute distress Head: Eyes PERRLA, No xanthomas. Normocephalic and atraumatic, oropharynx without edema or exudate.  Lungs: Resp regular and unlabored, CTA. Heart: Ir Ir tachycardic  no s3, s4, or murmurs..   Neck: No carotid bruits. No lymphadenopathy. No  JVD. Abdomen: Bowel sounds present, abdomen soft and non-tender without masses or hernias noted. Msk:  No spine or cva tenderness. No weakness, no joint deformities or effusions. Extremities: No clubbing, cyanosis. L extremity edema. DP/PT/Radials 2+ and equal bilaterally. Neuro: Alert and oriented X 3. R sided weakness.  Psych:  Good affect, responds appropriately Skin: No rashes or lesions noted.  Labs:   Lab Results  Component Value Date   WBC 6.1 02/23/2016   HGB 10.9 (L) 02/23/2016   HCT 34.5 (L) 02/23/2016   MCV 93.0 02/23/2016   PLT 130 (L) 02/23/2016    Recent Labs  02/23/16 0307  INR 1.23    Recent Labs Lab 02/23/16 0307  NA 148*  K 3.7  CL 111  CO2 31  BUN 46*  CREATININE 1.15  CALCIUM 8.9  PROT 5.9*  BILITOT 0.7  ALKPHOS 52  ALT  15*  AST 18  GLUCOSE 104*  ALBUMIN 3.2*   Magnesium  Date Value Ref Range Status  02/23/2016 2.4 1.7 - 2.4 mg/dL Final    Recent Labs  02/23/16 0307  TROPONINI 0.09*    Recent Labs  02/22/16 2209  TROPIPOC 0.08   Pro B Natriuretic peptide (BNP)  Date/Time Value Ref Range Status  02/08/2016 01:27 PM 483.0 (H) 0.0 - 100.0 pg/mL Final  07/03/2006 09:39 AM 52.0 0.0 - 100.0 pg/mL Final   Lab Results  Component Value Date   CHOL 131 02/10/2016   HDL 49 02/10/2016   LDLCALC 72 02/10/2016   TRIG 52 02/10/2016   No results found for: DDIMER Lipase  Date/Time Value Ref Range Status  11/10/2014 01:10 PM 32.0 11.0 - 59.0 U/L Final   TSH  Date/Time Value Ref Range Status  02/23/2016 03:07 AM 0.764 0.350 - 4.500 uIU/mL Final    Comment:    Performed by a 3rd Generation assay with a functional sensitivity of <=0.01 uIU/mL.  06/09/2015 02:36 PM 0.88 0.35 - 4.50 uIU/mL Final   Iron  Date/Time Value Ref Range Status  11/19/2015 01:21 PM 76 42 - 165 ug/dL Final    Echo: 02/09/16 LV EF: 60% -   65%  ------------------------------------------------------------------- Indications:      I163.9 Stroke.  ------------------------------------------------------------------- History:   PMH:  Acquired from the patient and from the patient&'s chart.  PMH:  Scoliosis. CHF.  Risk factors:  Hypertension. Dyslipidemia.  ------------------------------------------------------------------- Study Conclusions  - Left ventricle: The cavity size was normal. Systolic function was   normal. The estimated ejection fraction was in the range of 60%   to 65%. Wall motion was normal; there were no regional wall   motion abnormalities. Left ventricular diastolic function   parameters were normal. - Aortic valve: A bioprosthesis was present. There was very mild   stenosis, normal range for prosthetic valve. There was mild   regurgitation. - Mitral valve: Calcified annulus.  There was  moderate   regurgitation. - Right ventricle: Systolic function was normal. - Pulmonary arteries: Systolic pressure was severely elevated. PA   peak pressure: 70 mm Hg (S).  Impressions:  - No cardiac source of emboli was indentified. Saline contrast   bubble study not performed.  Radiology:  Ct Head Wo Contrast  Result Date: 02/23/2016 CLINICAL DATA:  RIGHT-sided weakness. Recent LEFT MCA territory infarct. Altered mental status. Fall. History of hypertension, hyperlipidemia, skin cancer. EXAM: CT HEAD WITHOUT CONTRAST TECHNIQUE: Contiguous axial images were obtained from the base of the skull through the vertex without intravenous contrast. COMPARISON:  MRI head February 11, 2016 FINDINGS: BRAIN: The ventricles and sulci are normal for age. No intraparenchymal hemorrhage, mass effect nor midline shift. Patchy supratentorial white matter hypodensities less than expected for patient's age, though non-specific are most compatible with chronic small vessel ischemic disease. Evolving LEFT frontal/ insula infarct. No acute large vascular territory infarcts. No abnormal extra-axial fluid collections. Basal cisterns are patent. VASCULAR: Mild calcific atherosclerosis of the carotid siphons. SKULL: No skull fracture. No significant scalp soft tissue swelling. SINUSES/ORBITS: The mastoid air-cells and included paranasal sinuses are well-aerated.The included ocular globes and orbital contents are non-suspicious. OTHER: Small amount of subcutaneous gas within LEFT infratemporal fossa most compatible with recent intravenous access. IMPRESSION: No acute intracranial process. Evolving small LEFT MCA territory infarct without hemorrhagic conversion. Otherwise negative CT HEAD for age. Electronically Signed   By: Elon Alas M.D.   On: 02/23/2016 00:22   Dg Chest Portable 1 View  Result Date: 02/22/2016 CLINICAL DATA:  82 y/o M; chest pain, shortness of breath, and atrial fibrillation. EXAM: PORTABLE CHEST  1 VIEW COMPARISON:  02/09/2016 chest radiograph. FINDINGS: Stable mild cardiomegaly. Stented aortic valve replacement. Calcified pleural plaques. Interval improvement in bilateral pleural effusions. Aortic atherosclerosis with arch calcification. No new focal consolidation of the lungs. Moderate dextrocurvature of the lower thoracic spine. IMPRESSION: Interval improvement in bilateral pleural effusions. No new acute pulmonary process. Electronically Signed   By: Kristine Garbe M.D.   On: 02/22/2016 22:54    ASSESSMENT AND PLAN:      1. AFib RVR (new onset) - Off Cardizem due to hypotension. Rate still elevated to 120-130s. TSH normal.  CHADSVASCS score of 6 (age, stoke, CHF and diastolic CHF and HTN). Hx of recent stroke 02/09/16. Head CT this admission without acute findings. No hemorraghic conversation. Agree with palliative care consult, meeting tomorrow at 11am.  - Will plan for rate control strategy given recent stoke and not on any anticoagulation. Start Cardizem po 30mg  q 6 hours. DC Cardizem gtt given hypotension. Pharmacy consult for Eliquis.   2. Acute on chronic diastolic CHF - BNP of [id number]. Low blood limiting aggressive diuresis. Continue IV lasix 20mg  qd. Net I & O negative 150cc.   3. S/p TVAR - Recent echo showed normal valve function  4. pHTN - Continue Revatio  5. Recent stroke - per primary  6. Anemia - stable. Follow closely.   SignedLeanor Kail, [place] 02/23/2016, 12:46 PM Pager [phone]   Patient examined chart reviewed. Little history from patient with somnolent MS. Afib likely cause of recent stroke No loop recorder advised at [place] Echo being done but EF normal in past. Given age and general status With recent CVA and no anticoagulation on board would not be aggressive about cardioverting Rate control with cardizem and start eliquis likely 2.5 bid dosing per pharmacy Agree with family meeting would appear to be a palliative Patient  Jenkins Rouge  Co-Sign MD

## 2016-02-24 DIAGNOSIS — I248 Other forms of acute ischemic heart disease: Secondary | ICD-10-CM | POA: Diagnosis present

## 2016-02-24 DIAGNOSIS — Z66 Do not resuscitate: Secondary | ICD-10-CM | POA: Diagnosis present

## 2016-02-24 DIAGNOSIS — E162 Hypoglycemia, unspecified: Secondary | ICD-10-CM | POA: Diagnosis present

## 2016-02-24 DIAGNOSIS — I110 Hypertensive heart disease with heart failure: Secondary | ICD-10-CM | POA: Diagnosis present

## 2016-02-24 DIAGNOSIS — I63512 Cerebral infarction due to unspecified occlusion or stenosis of left middle cerebral artery: Secondary | ICD-10-CM | POA: Diagnosis present

## 2016-02-24 DIAGNOSIS — Z87891 Personal history of nicotine dependence: Secondary | ICD-10-CM | POA: Diagnosis not present

## 2016-02-24 DIAGNOSIS — N400 Enlarged prostate without lower urinary tract symptoms: Secondary | ICD-10-CM | POA: Diagnosis present

## 2016-02-24 DIAGNOSIS — Z7982 Long term (current) use of aspirin: Secondary | ICD-10-CM | POA: Diagnosis not present

## 2016-02-24 DIAGNOSIS — M419 Scoliosis, unspecified: Secondary | ICD-10-CM | POA: Diagnosis present

## 2016-02-24 DIAGNOSIS — Z515 Encounter for palliative care: Secondary | ICD-10-CM

## 2016-02-24 DIAGNOSIS — D638 Anemia in other chronic diseases classified elsewhere: Secondary | ICD-10-CM | POA: Diagnosis present

## 2016-02-24 DIAGNOSIS — I4891 Unspecified atrial fibrillation: Secondary | ICD-10-CM | POA: Diagnosis not present

## 2016-02-24 DIAGNOSIS — I5033 Acute on chronic diastolic (congestive) heart failure: Secondary | ICD-10-CM | POA: Diagnosis present

## 2016-02-24 DIAGNOSIS — I272 Pulmonary hypertension, unspecified: Secondary | ICD-10-CM | POA: Diagnosis present

## 2016-02-24 DIAGNOSIS — Z8673 Personal history of transient ischemic attack (TIA), and cerebral infarction without residual deficits: Secondary | ICD-10-CM | POA: Diagnosis not present

## 2016-02-24 DIAGNOSIS — D6959 Other secondary thrombocytopenia: Secondary | ICD-10-CM | POA: Diagnosis present

## 2016-02-24 DIAGNOSIS — Z7189 Other specified counseling: Secondary | ICD-10-CM

## 2016-02-24 DIAGNOSIS — R7989 Other specified abnormal findings of blood chemistry: Secondary | ICD-10-CM

## 2016-02-24 DIAGNOSIS — I959 Hypotension, unspecified: Secondary | ICD-10-CM | POA: Diagnosis present

## 2016-02-24 DIAGNOSIS — D696 Thrombocytopenia, unspecified: Secondary | ICD-10-CM

## 2016-02-24 DIAGNOSIS — E785 Hyperlipidemia, unspecified: Secondary | ICD-10-CM | POA: Diagnosis present

## 2016-02-24 DIAGNOSIS — F0390 Unspecified dementia without behavioral disturbance: Secondary | ICD-10-CM | POA: Diagnosis present

## 2016-02-24 DIAGNOSIS — R778 Other specified abnormalities of plasma proteins: Secondary | ICD-10-CM

## 2016-02-24 DIAGNOSIS — E860 Dehydration: Secondary | ICD-10-CM | POA: Diagnosis present

## 2016-02-24 DIAGNOSIS — I69351 Hemiplegia and hemiparesis following cerebral infarction affecting right dominant side: Secondary | ICD-10-CM | POA: Diagnosis not present

## 2016-02-24 DIAGNOSIS — R748 Abnormal levels of other serum enzymes: Secondary | ICD-10-CM

## 2016-02-24 DIAGNOSIS — R600 Localized edema: Secondary | ICD-10-CM | POA: Diagnosis present

## 2016-02-24 DIAGNOSIS — Z952 Presence of prosthetic heart valve: Secondary | ICD-10-CM | POA: Diagnosis not present

## 2016-02-24 DIAGNOSIS — E870 Hyperosmolality and hypernatremia: Secondary | ICD-10-CM | POA: Diagnosis present

## 2016-02-24 LAB — BASIC METABOLIC PANEL
Anion gap: 8 (ref 5–15)
BUN: 41 mg/dL — AB (ref 6–20)
CALCIUM: 8.7 mg/dL — AB (ref 8.9–10.3)
CO2: 28 mmol/L (ref 22–32)
CREATININE: 1.08 mg/dL (ref 0.61–1.24)
Chloride: 111 mmol/L (ref 101–111)
GFR calc non Af Amer: >60 mL/min (ref >60)
Glucose, Bld: 114 mg/dL — ABNORMAL HIGH (ref 65–99)
Potassium: 3.9 mmol/L (ref 3.5–5.1)
SODIUM: 147 mmol/L — AB (ref 135–145)

## 2016-02-24 LAB — GLUCOSE, CAPILLARY
GLUCOSE-CAPILLARY: 108 mg/dL — AB (ref 65–99)
GLUCOSE-CAPILLARY: 110 mg/dL — AB (ref 65–99)
GLUCOSE-CAPILLARY: 98 mg/dL (ref 65–99)
Glucose-Capillary: 97 mg/dL (ref 65–99)
Glucose-Capillary: 116 mg/dL — ABNORMAL HIGH (ref 65–99)
Glucose-Capillary: 122 mg/dL — ABNORMAL HIGH (ref 65–99)

## 2016-02-24 LAB — CBC
HCT: 34.3 % — ABNORMAL LOW (ref 39.0–52.0)
Hemoglobin: 11 g/dL — ABNORMAL LOW (ref 13.0–17.0)
MCH: 29.8 pg (ref 26.0–34.0)
MCHC: 32.1 g/dL (ref 30.0–36.0)
MCV: 93 fL (ref 78.0–100.0)
Platelets: 129 10*3/uL — ABNORMAL LOW (ref 150–400)
RBC: 3.69 MIL/uL — ABNORMAL LOW (ref 4.22–5.81)
RDW: 14.1 % (ref 11.5–15.5)
WBC: 9 10*3/uL (ref 4.0–10.5)

## 2016-02-24 LAB — URINE CULTURE: Culture: NO GROWTH

## 2016-02-24 MED ORDER — HALOPERIDOL LACTATE 2 MG/ML PO CONC
0.5000 mg | ORAL | Status: DC | PRN
  Filled 2016-02-24: qty 0.3

## 2016-02-24 MED ORDER — DILTIAZEM HCL 60 MG PO TABS
60.0000 mg | ORAL_TABLET | Freq: Four times a day (QID) | ORAL | Status: DC
  Administered 2016-02-24 (×2): 60 mg via ORAL
  Filled 2016-02-24 (×2): qty 1

## 2016-02-24 MED ORDER — SODIUM CHLORIDE 0.9% FLUSH: 3.0000 mL | INTRAVENOUS | Status: DC | PRN

## 2016-02-24 MED ORDER — POLYVINYL ALCOHOL 1.4 % OP SOLN
1.0000 [drp] | Freq: Four times a day (QID) | OPHTHALMIC | Status: DC | PRN
  Filled 2016-02-24: qty 15

## 2016-02-24 MED ORDER — HALOPERIDOL LACTATE 5 MG/ML IJ SOLN
1.0000 mg | Freq: Four times a day (QID) | INTRAMUSCULAR | Status: DC | PRN
  Administered 2016-02-24 – 2016-02-26 (×3): 1 mg via INTRAVENOUS
  Filled 2016-02-24 (×2): qty 1

## 2016-02-24 MED ORDER — ONDANSETRON HCL 4 MG/2ML IJ SOLN: 4.0000 mg | Freq: Four times a day (QID) | INTRAMUSCULAR | Status: DC | PRN

## 2016-02-24 MED ORDER — SODIUM CHLORIDE 0.9 % IV SOLN: 250.0000 mL | INTRAVENOUS | Status: DC | PRN

## 2016-02-24 MED ORDER — HALOPERIDOL LACTATE 5 MG/ML IJ SOLN: 3.0000 mg | Freq: Once | INTRAMUSCULAR | Status: DC

## 2016-02-24 MED ORDER — GLYCOPYRROLATE 0.2 MG/ML IJ SOLN: 0.2000 mg | INTRAMUSCULAR | Status: DC | PRN

## 2016-02-24 MED ORDER — HALOPERIDOL 0.5 MG PO TABS
0.5000 mg | ORAL_TABLET | ORAL | Status: DC | PRN
  Filled 2016-02-24: qty 1

## 2016-02-24 MED ORDER — SODIUM CHLORIDE 0.9% FLUSH
3.0000 mL | Freq: Two times a day (BID) | INTRAVENOUS | Status: DC
  Administered 2016-02-24 – 2016-02-26 (×4): 3 mL via INTRAVENOUS

## 2016-02-24 MED ORDER — MORPHINE SULFATE (CONCENTRATE) 10 MG/0.5ML PO SOLN
10.0000 mg | ORAL | Status: DC | PRN
  Administered 2016-02-25 – 2016-02-26 (×3): 10 mg via SUBLINGUAL
  Filled 2016-02-24 (×3): qty 0.5

## 2016-02-24 MED ORDER — MORPHINE SULFATE (CONCENTRATE) 10 MG/0.5ML PO SOLN: 10.0000 mg | ORAL | Status: DC | PRN

## 2016-02-24 MED ORDER — METOPROLOL TARTRATE 50 MG PO TABS
50.0000 mg | ORAL_TABLET | Freq: Two times a day (BID) | ORAL | Status: DC
  Administered 2016-02-24: 50 mg via ORAL
  Filled 2016-02-24: qty 1

## 2016-02-24 MED ORDER — HALOPERIDOL LACTATE 5 MG/ML IJ SOLN
0.5000 mg | INTRAMUSCULAR | Status: DC | PRN
  Administered 2016-02-24: 0.5 mg via INTRAVENOUS
  Filled 2016-02-24 (×2): qty 1

## 2016-02-24 MED ORDER — BIOTENE DRY MOUTH MT LIQD: 15.0000 mL | OROMUCOSAL | Status: DC | PRN

## 2016-02-24 MED ORDER — ONDANSETRON 4 MG PO TBDP: 4.0000 mg | ORAL_TABLET | Freq: Four times a day (QID) | ORAL | Status: DC | PRN

## 2016-02-24 MED ORDER — MORPHINE SULFATE (CONCENTRATE) 10 MG/0.5ML PO SOLN
5.0000 mg | ORAL | Status: DC | PRN
  Administered 2016-02-24: 5 mg via SUBLINGUAL
  Filled 2016-02-24: qty 0.5

## 2016-02-24 MED ORDER — MORPHINE SULFATE (CONCENTRATE) 10 MG/0.5ML PO SOLN: 5.0000 mg | ORAL | Status: DC | PRN

## 2016-02-24 MED ORDER — GLYCOPYRROLATE 1 MG PO TABS
1.0000 mg | ORAL_TABLET | ORAL | Status: DC | PRN
  Filled 2016-02-24: qty 1

## 2016-02-24 NOTE — Evaluation (Signed)
Physical Therapy Evaluation Patient Details Name: Douglas Blocker Adams Sr. MRN: [id number] DOB: 10/12/1927 Today's Date: 02/24/2016   History of Present Illness  Pt adm from SNF with afib with RVR. Pt with recent admission for Lt MCA CVA at [facility]. PMH - CVA, TAVR, HTN, arthritis, CHF, scoliosis  Clinical Impression  Pt admitted with above diagnosis and presents to PT with functional limitations due to deficits listed below (See PT problem list). Pt needs skilled PT to maximize independence and safety to allow discharge to SNF vs hospice home. Doubt pt will recover enough function to regain enough independence to return home. Family meeting with palliative to discuss plans.     Follow Up Recommendations SNF (unless family opts for hospice home)    Equipment Recommendations  Other (comment) (TBD)    Recommendations for Other Services       Precautions / Restrictions Precautions Precautions: Fall Restrictions Weight Bearing Restrictions: No      Mobility  Bed Mobility Overal bed mobility: Needs Assistance Bed Mobility: Supine to Sit     Supine to sit: +2 for physical assistance;Max assist     General bed mobility comments: Assist for all aspects  Transfers Overall transfer level: Needs assistance Equipment used: Rolling walker (2 wheeled) Transfers: Sit to/from [facility] Sit to Stand: +2 physical assistance;Mod assist Stand pivot transfers: +2 physical assistance;Mod assist       General transfer comment: Assist to bring hips up and for balance. Pt with difficulty keeping rt hand on walker. Verbal/tactile cues for step by step instructions. Pt with very small shuffling pivotal steps to chair. HR 120's to 160's at rest and with activity  Ambulation/Gait                Stairs            Wheelchair Mobility    Modified Rankin (Stroke Patients Only)       Balance Overall balance assessment: Needs assistance Sitting-balance support:  Bilateral upper extremity supported;Feet supported Sitting balance-Leahy Scale: Poor Sitting balance - Comments: Min to mod A to sit EOB x 7-8 minutes.  Postural control: Right lateral lean Standing balance support: Bilateral upper extremity supported Standing balance-Leahy Scale: Poor Standing balance comment: walker and +2 mod A for static standing                             Pertinent Vitals/Pain Pain Assessment: Faces Faces Pain Scale: No hurt    Home Living Family/patient expects to be discharged to:: Skilled nursing facility                 Additional Comments: Possibly hospice home    Prior Function Level of Independence: Needs assistance   Gait / Transfers Assistance Needed: Assist to amb short distance with walker since recent CVA. Prior to CVA pt independent with mobility           Hand Dominance   Dominant Hand: Right    Extremity/Trunk Assessment   Upper Extremity Assessment: RUE deficits/detail;Generalized weakness RUE Deficits / Details: Impaired strength, motor control and coordination         Lower Extremity Assessment: RLE deficits/detail;LLE deficits/detail RLE Deficits / Details: grossly 3-/5 LLE Deficits / Details: grossly 3-/5  Cervical / Trunk Assessment: Other exceptions (significant scoliosis)  Communication   Communication: Other (comment) (difficult to understand)  Cognition Arousal/Alertness: Lethargic Behavior During Therapy: Flat affect Overall Cognitive Status: No family/caregiver present to determine baseline  cognitive functioning Area of Impairment: Orientation;Attention;Memory;Following commands;Safety/judgement;Problem solving Orientation Level: Disoriented to;Place;Time;Situation Current Attention Level: Focused Memory: Decreased short-term memory Following Commands: Follows one step commands inconsistently Safety/Judgement: Decreased awareness of safety;Decreased awareness of deficits   Problem Solving:  Slow processing;Decreased initiation;Difficulty sequencing;Requires verbal cues;Requires tactile cues      General Comments      Exercises     Assessment/Plan    PT Assessment Patient needs continued PT services  PT Problem List Decreased strength;Decreased activity tolerance;Decreased balance;Decreased mobility;Decreased coordination;Decreased cognition;Decreased knowledge of use of DME;Decreased safety awareness;Decreased knowledge of precautions;Cardiopulmonary status limiting activity          PT Treatment Interventions DME instruction;Gait training;Functional mobility training;Therapeutic activities;Therapeutic exercise;Balance training;Cognitive remediation;Patient/family education    PT Goals (Current goals can be found in the Care Plan section)  Acute Rehab PT Goals PT Goal Formulation: Patient unable to participate in goal setting Time For Goal Achievement: 03/09/16 Potential to Achieve Goals: Fair    Frequency Min 2X/week   Barriers to discharge        Co-evaluation               End of Session Equipment Utilized During Treatment: Gait belt Activity Tolerance: Patient limited by fatigue;Patient limited by lethargy Patient left: in chair;with call bell/phone within reach;with chair alarm set;with family/visitor present Nurse Communication: Mobility status    Functional Assessment Tool Used: clinical judgement Functional Limitation: Mobility: Walking and moving around Mobility: Walking and Moving Around Current Status [phone]): At least 80 percent but less than 100 percent impaired, limited or restricted Mobility: Walking and Moving Around Goal Status [phone]): At least 40 percent but less than 60 percent impaired, limited or restricted    Time: 1011-1039 PT Time Calculation (min) (ACUTE ONLY): 28 min   Charges:   PT Evaluation $PT Eval Moderate Complexity: 1 Procedure PT Treatments $Therapeutic Activity: 8-22 mins   PT G Codes:   PT G-Codes **NOT  FOR INPATIENT CLASS** Functional Assessment Tool Used: clinical judgement Functional Limitation: Mobility: Walking and moving around Mobility: Walking and Moving Around Current Status [phone]): At least 80 percent but less than 100 percent impaired, limited or restricted Mobility: Walking and Moving Around Goal Status [phone]): At least 40 percent but less than 60 percent impaired, limited or restricted    [place] 02/24/2016, 12:27 PM [employer] PT [phone]

## 2016-02-24 NOTE — Progress Notes (Signed)
Daily Progress Note   Patient Name: Douglas ROUGHT Sr.       Date: 02/24/2016 DOB: [birth year]  Age: 82 y.o. MRN#: [id number] Attending Physician: Robbie Lis, MD Primary Care Physician: Elsie Stain, MD Admit Date: 02/22/2016  Reason for Consultation/Follow-up: Establishing goals of care  Subjective: Patient confused.  Spoke with cousin Wille Glaser, Zara Chess and Casey's wife [place] at bedside.  The patient is married but according to his family his wife simply lives in the same house with him.  They are not close and she refused to come to the hospital for this morning's meeting.  Thus, it appears POA falls to the patient's only remaining son, Douglas Parker.  Patient was very independent and strong until 3 weeks ago.  Family would like for things to be different but they believe he would not want to live this way (weakened and dependent).  We discussed the current dysfunction in his brain, his heart, his swallow, his lungs and his musculoskeletal system.  PT just worked with the patient and he required 2 person assist.  It is felt that he will not recover his independence.  After much discussion,  Douglas Parker agrees that Hospice house is the best option for his father and their family.  The family understands that he will likely pass at [place] and they want his passing to be comfortable.   Length of Stay: 0  Current Medications: Scheduled Meds:  . apixaban  2.5 mg Oral BID  . aspirin EC  81 mg Oral Daily  . atorvastatin  40 mg Oral Daily  . diltiazem  60 mg Oral Q6H  . finasteride  5 mg Oral Daily  . furosemide  20 mg Oral Once per day on Mon Wed Fri Sat  . gabapentin  100 mg Oral TID  . metoprolol tartrate  50 mg Oral BID  . modafinil  100 mg Oral Daily  . potassium chloride SA  20 mEq Oral Once  per day on Mon Wed Fri Sat  . sildenafil  20 mg Oral TID    Continuous Infusions:   PRN Meds: acetaminophen **OR** acetaminophen, RESOURCE THICKENUP CLEAR  Physical Exam  Constitutional:  Frail, confused.  Sitting in recliner chair.  Family at bedside.  HENT:  Head: Normocephalic and atraumatic.  Eyes: Left eye exhibits no discharge. No  scleral icterus.  Neck: Normal range of motion. Neck supple.  Cardiovascular:  Murmur heard. Tachycardic   Pulmonary/Chest: Effort normal. No respiratory distress.  Abdominal: Soft. Bowel sounds are normal. He exhibits no distension.  Musculoskeletal: He exhibits no edema or deformity.  Generalized weakness.  Neurological:  Confused.  Follows commands.  Has involuntary jerking of both of his arms.  Skin: Skin is warm and dry.  Psychiatric:  Calm.  No complaints.  Confused.           Vital Signs: BP 100/61 (BP Location: Right Arm)   Pulse (!) 146   Temp 97.6 F (36.4 C) (Axillary)   Resp 18   Wt 63.5 kg (140 lb)   SpO2 93%   BMI 20.09 kg/m  SpO2: SpO2: 93 % O2 Device: O2 Device: Not Delivered O2 Flow Rate: O2 Flow Rate (L/min): 4 L/min  Intake/output summary:  Intake/Output Summary (Last 24 hours) at 02/24/16 1201 Last data filed at 02/23/16 1900  Gross per 24 hour  Intake              420 ml  Output              150 ml  Net              270 ml   LBM:   Baseline Weight: Weight: 63.3 kg (139 lb 9.6 oz) Most recent weight: Weight: 63.5 kg (140 lb)       Palliative Assessment/Data:    Flowsheet Rows   Flowsheet Row Most Recent Value  Intake Tab  Referral Department  Hospitalist  Unit at Time of Referral  Intermediate Care Unit  Palliative Care Primary Diagnosis  Cardiac  Date Notified  02/23/16  Palliative Care Type  New Palliative care  Reason for referral  Clarify Goals of Care  Date of Admission  02/22/16  Date first seen by Palliative Care  02/23/16  # of days Palliative referral response time  0 Day(s)  # of  days IP prior to Palliative referral  1  Clinical Assessment  Palliative Performance Scale Score  20%  Psychosocial & Spiritual Assessment  Palliative Care Outcomes  Patient/Family meeting held?  Yes  Who was at the meeting?  patient.  Son Douglas Parker was on the phone.  Palliative Care Outcomes  Changed CPR status, Clarified goals of care  Patient/Family wishes: Interventions discontinued/not started   Mechanical Ventilation      Patient Active Problem List   Diagnosis Date Noted  . Thrombocytopenia ([place])   . Troponin level elevated   . Atrial fibrillation with RVR ([place]) 02/23/2016  . Hypoglycemia 02/23/2016  . History of ischemic left MCA stroke 02/23/2016  . Anemia in other chronic diseases classified elsewhere 02/23/2016  . Acute CVA (cerebrovascular accident) ([place]) 02/09/2016  . Pneumonia 01/05/2016  . Atypical chest pain 12/01/2015  . Cervical lymphadenopathy 04/20/2015  . BPH (benign prostatic hyperplasia) 02/10/2014  . Dyslipidemia 02/10/2014  . GERD (gastroesophageal reflux disease) 02/10/2014  . Severe aortic valve stenosis 02/03/2014  . 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 Aortic stenosis, severe 12/19/2013  . Chronic diastolic congestive heart failure ([place])   . Back pain   . Edema 11/20/2013  . Loss of weight 06/26/2013  . Cornea scar 04/09/2013  . Chronic glaucoma 12/17/2012  . Aortic valve stenosis 09/10/2012  . Pseudoaphakia 06/28/2012  . Living will, counseling/discussion 12/28/2011  . 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 Degeneration macular 03/06/2011  . Constipation 01/02/2007  . Hypertensive  heart disease with CHF (congestive heart failure) ([place]) 05/19/2003  . CA IN SITU, SKIN NOS 05/19/1999  . Arthropathy of pelvic region and thigh 05/18/1997  . HEMORRHOIDS, INTERNAL, WITH BLEEDING 03/20/1994  . DIVERTICULOSIS, COLON 02/17/1994  . FX OPEN MLT PELVIS W/PELVIC CIRCULAT DISRUPT 03/20/1986  . SCOLIOSIS, THORACIC SPINE 03/20/1944     Palliative Care Assessment & Plan   Patient Profile: 82 y.o. male  with past medical history of aortic valve replacement, diastolic heart failure, scoliosis, recent left MCA CVA (02/09/16) and severe pulmonary hypertension, and aspiration pneumonia who was admitted on 02/22/2016 with recurrent right sided weakness and new onset afib.  The patient is having pulse rates between 110 - 150.  Initially Cardiology recommended Cardizem gtt but this was discontinued due to hypotension.  Cardizem PO is being started.  Due to low BP it is also difficult to diuresis him to control symptoms of his DHF and PHTN.  During our conversation he is somewhat confused.  Recommendations/Plan:  DNR/DNI  Family wants Hospice House at discharge [facility] or [place] - family is centrally located)  Shift to full comfort after the patient is as "tuned up" as he can be.  Will D/C statin, aspirin, restrictive diet in a move towards comfort.   Prognosis:   < 2 weeks secondary to afib with RVR, hypotension, recurrent aspiration, MCA stroke, pulmonary hypertension, inability to walk or feed himself.  Discharge Planning:  Hospice facility   Care plan was discussed with family and [place] attending MD  Thank you for allowing the Palliative Medicine Team to assist in the care of this patient.   Time In: 11:00 Time Out: 12:00 Total Time 65 min Prolonged Time Billed yes      Greater than 50%  of this time was spent counseling and coordinating care related to the above assessment and plan.  Imogene Burn, PA-C Palliative Medicine   Please contact Palliative MedicineTeam phone at [phone] for questions and concerns.   Please see AMION for individual provider pager numbers.

## 2016-02-24 NOTE — Progress Notes (Addendum)
Patient ID: Douglas Boyden Sr., male   DOB: [birth year], 82 y.o.   MRN: [id number]  PROGRESS NOTE    Douglas Conger Asare Sr.  [id number] DOB: 1927/05/06 DOA: 02/22/2016  PCP: Elsie Stain, MD   Brief Narrative:  82 year old male with past medical history significant for severe aortic stenosis s/p TAVR in 2015, HFpEF, HTN, and severe pHTNwho presented to [facility] with recurrent right sided weakness and subsequently found to have new Afib. The patient was admitted to [facility] from 11/22-11/26 for new left MCA stroke with a proximal left M2 MCA occlusion not amenable to intervention. During that hospitalization, he was noted to have a pneumonia, likely aspiration treated with levaquin. Per family report, pt was getting more energetic, able to walk short distances with therapy, oriented. However, over the past two days PTA, he has started to decline again. He was not able to stand up without the assistance. His speech was incoherent. He was subsequently transferred to ER for evaluation.  In ED, blood pressure was 87/56, heart rate 32-144, oxygen saturation 83% on room air which has improved to 100% with [place] oxygen support. Blood work was notable for hemoglobin of 11.4, sodium 150, normal creatinine, normal lactic acid. BNP was 552, normal troponin. Because of atrial fibrillation, patient was started on Cardizem drip.  Assessment & Plan:    New Afib with RVR - CHADS2Vasc 5 (for age, stroke, CHF, HTN) - Cardiology consulted - On Cardizem drip but had to hold it due to soft BP on 12/6 - Cardiology changed from Cardizem drip to Cardizem 60 mg by mouth every 6 hours along with metoprolol 50 mg twice daily - Cardiology started Apixaban 2.5 mg BID in addition to aspirin which pt was taking prior to admission   Essential hypertension - Continue metoprolol, Cardizem, Lasix  Mild troponin elevation - Likely demand ischemia from new atrial fibrillation - Management per cardio  Hypernatremia - Likely  dehydration - Continue IV fluids - Sodium 147 this morning - Follow-up BMP tomorrow morning  Thrombocytopenia - Possibly related to aspirin - Monitor platelet count since patient on antiplatelet agents  Acute on chronic diastolic CHF - Soft blood pressure limiting aggressive diuresis - He is on Lasix 20 mg Monday, Wednesday, Friday and Saturday  S/p TVAR - Recent echo showed normal valve function  Pulmonary hypertension - Continue Revatio  Dyslipidemia - Continue Lipitor 40 mg at bedtime  Left MCA stroke - Recent hospitalization - Continue aspirin - PT eval     DVT prophylaxis: Aspirin, Eliquis  Code Status: DNR/DNI Family Communication: no family at the bedside this am Disposition Plan: home once stable, anticipate in next 2-3 days    Consultants:   Cardiology  PCT  Procedures:   None   Antimicrobials:   None    Subjective: No overnight events.   Objective: Vitals:   02/23/16 2302 02/24/16 0334 02/24/16 0822 02/24/16 1002  BP: 91/71 (!) 147/112 118/83 (!) 113/98  Pulse: 87 (!) 110 (!) 56 (!) 146  Resp: (!) 21 14 18    Temp: 98.1 F (36.7 C) 97.8 F (36.6 C) 97.4 F (36.3 C)   TempSrc: Axillary Axillary Axillary   SpO2: 97% 94% 93%   Weight:  63.5 kg (140 lb)      Intake/Output Summary (Last 24 hours) at 02/24/16 1049 Last data filed at 02/23/16 1900  Gross per 24 hour  Intake              420 ml  Output  150 ml  Net              270 ml   Filed Weights   02/23/16 0300 02/24/16 0334  Weight: 63.3 kg (139 lb 9.6 oz) 63.5 kg (140 lb)    Examination:  General exam: Appears calm and comfortable  Respiratory system: No wheezing, coarse breath sounds  Cardiovascular system: S1 & S2 heard, tachycardic  Gastrointestinal system: Abdomen is nondistended, soft and nontender. No organomegaly or masses felt. Normal bowel sounds heard. Central nervous system: he is sleeping, opens eyes to verbal stimuli Extremities: No tenderness,  palpable pulses  Skin: warm, dry  Psychiatry: unable to accurately assess due to pt altered mental status   Data Reviewed: I have personally reviewed following labs and imaging studies  CBC:  Recent Labs Lab 02/21/16 02/22/16 2150 02/23/16 0307 02/24/16 0410  WBC 7.2 6.8 6.1 9.0  NEUTROABS  --  5.1  --   --   HGB 11.4* 11.1* 10.9* 11.0*  HCT 36* 35.0* 34.5* 34.3*  MCV  --  93.3 93.0 93.0  PLT 159 132* 130* Q000111Q*   Basic Metabolic Panel:  Recent Labs Lab 02/21/16 02/22/16 2150 02/23/16 0307 02/24/16 0410  NA 150* 145 148* 147*  K 3.7 3.9 3.7 3.9  CL  --  110 111 111  CO2  --  28 31 28   GLUCOSE  --  256* 104* 114*  BUN 50* 49* 46* 41*  CREATININE 1.1 1.19 1.15 1.08  CALCIUM  --  8.7* 8.9 8.7*  MG  --   --  2.4  --    GFR: Estimated Creatinine Clearance: 44.1 mL/min (by C-G formula based on SCr of 1.08 mg/dL). Liver Function Tests:  Recent Labs Lab 02/22/16 2150 02/23/16 0307  AST 19 18  ALT 15* 15*  ALKPHOS 53 52  BILITOT 0.6 0.7  PROT 6.0* 5.9*  ALBUMIN 3.3* 3.2*   No results for input(s): LIPASE, AMYLASE in the last 168 hours. No results for input(s): AMMONIA in the last 168 hours. Coagulation Profile:  Recent Labs Lab 02/22/16 2150 02/23/16 0307  INR 1.27 1.23   Cardiac Enzymes:  Recent Labs Lab 02/23/16 0307  TROPONINI 0.09*   BNP (last 3 results)  Recent Labs  02/08/16 1327  PROBNP 483.0*   HbA1C: No results for input(s): HGBA1C in the last 72 hours. CBG:  Recent Labs Lab 02/23/16 2305 02/24/16 0016 02/24/16 0212 02/24/16 0607 02/24/16 0826  GLUCAP 104* 110* 108* 97 116*   Lipid Profile: No results for input(s): CHOL, HDL, LDLCALC, TRIG, CHOLHDL, LDLDIRECT in the last 72 hours. Thyroid Function Tests:  Recent Labs  02/23/16 0307  TSH 0.764   Anemia Panel: No results for input(s): VITAMINB12, FOLATE, FERRITIN, TIBC, IRON, RETICCTPCT in the last 72 hours. Urine analysis:    Component Value Date/Time   COLORURINE  AMBER (A) 02/22/2016 2259   APPEARANCEUR HAZY (A) 02/22/2016 2259   LABSPEC 1.024 02/22/2016 2259   PHURINE 5.0 02/22/2016 2259   GLUCOSEU >=500 (A) 02/22/2016 2259   HGBUR NEGATIVE 02/22/2016 2259   BILIRUBINUR NEGATIVE 02/22/2016 2259   BILIRUBINUR negative 09/24/2013 1825   KETONESUR NEGATIVE 02/22/2016 2259   PROTEINUR 30 (A) 02/22/2016 2259   UROBILINOGEN 0.2 02/05/2014 0332   NITRITE NEGATIVE 02/22/2016 2259   LEUKOCYTESUR NEGATIVE 02/22/2016 2259   Sepsis Labs: @LABRCNTIP (procalcitonin:4,lacticidven:4)   Urine culture     Status: None   Collection Time: 02/22/16 10:46 PM  Result Value Ref Range Status   Specimen Description URINE, CLEAN  CATCH  Final   Special Requests NONE  Final   Culture NO GROWTH  Final   Report Status 02/24/2016 FINAL  Final  MRSA PCR Screening     Status: None   Collection Time: 02/23/16  2:31 AM  Result Value Ref Range Status   MRSA by PCR NEGATIVE NEGATIVE Final      Radiology Studies: Ct Head Wo Contrast Result Date: 02/23/2016  No acute intracranial process. Evolving small LEFT MCA territory infarct without hemorrhagic conversion. Otherwise negative CT HEAD for age. Electronically Signed   By: Elon Alas M.D.   On: 02/23/2016 00:22   Dg Chest Portable 1 View Result Date: 02/22/2016  Interval improvement in bilateral pleural effusions. No new acute pulmonary process. Electronically Signed   By: Kristine Garbe M.D.   On: 02/22/2016 22:54      Scheduled Meds: . apixaban  2.5 mg Oral BID  . aspirin EC  81 mg Oral Daily  . atorvastatin  40 mg Oral Daily  . diltiazem  60 mg Oral Q6H  . finasteride  5 mg Oral Daily  . furosemide  20 mg Oral Once per day on Mon Wed Fri Sat  . gabapentin  100 mg Oral TID  . metoprolol tartrate  50 mg Oral BID  . modafinil  100 mg Oral Daily  . potassium chloride SA  20 mEq Oral Once per day on Mon Wed Fri Sat  . sildenafil  20 mg Oral TID   Continuous Infusions:   LOS: 0 days    Time  spent: 25 minutes  Greater than 50% of the time spent on counseling and coordinating the care.   Leisa Lenz, MD [facility] Pager [phone]  If 7PM-7AM, please contact night-coverage [email or web address] Password [email or web address] 02/24/2016, 10:49 AM

## 2016-02-24 NOTE — Clinical Social Work Note (Signed)
Clinical Social Work Assessment  Patient Details  Name: Douglas MIMMS Sr. MRN: [id number] Date of Birth: 15-Jun-1927  Date of referral:  02/24/16               Reason for consult:  End of Life/Hospice                Permission sought to share information with:  Facility Sport and exercise psychologist, Family Supports Permission granted to share information::  No  Name::     Warehouse manager::  Hospice  Relationship::  Son  Sport and exercise psychologist Information:     Housing/Transportation Living arrangements for the past 2 months:  [place], [place] of Information:  Adult Children Patient Interpreter Needed:  None Criminal Activity/Legal Involvement Pertinent to Current Situation/Hospitalization:  No - Comment as needed Significant Relationships:  Adult Children Lives with:  Spouse Do you feel safe going back to the place where you live?  No Need for family participation in patient care:  Yes (Comment)  Care giving concerns:  CSW received consult regarding discharge planning. Per palliative care, patient is appropriate for Hospice. CSW spoke with patient's son, who is the Media planner. He reports understanding of his father's illness and believes that residential hospice would be the best option, though he had hoped to find another way.    Social Worker assessment / plan:  CSW spoke with patient's son regarding residential hospice.  Employment status:  Retired Nurse, adult PT Recommendations:  [place] / Referral to community resources:  Other (Comment Required), [facility])  Patient/Family's Response to care:  Patient's son had hoped patient would get better, but understands his decline. He reports preference for [facility]. If no availability, then [facility] is also acceptable. CSW provided supportive listening.   Patient/Family's Understanding of and Emotional Response to Diagnosis,  Current Treatment, and Prognosis:  No questions or concerns reported at this time.   Emotional Assessment Appearance:  Appears stated age Attitude/Demeanor/Rapport:  Unable to Assess Affect (typically observed):  Unable to Assess Orientation:  Oriented to Self Alcohol / Substance use:  Not Applicable Psych involvement (Current and /or in the community):  No (Comment)  Discharge Needs  Concerns to be addressed:  Care Coordination Readmission within the last 30 days:  Yes Current discharge risk:  Terminally ill Barriers to Discharge:  Continued Medical Work up   [employer], [place] 02/24/2016, 5:04 PM

## 2016-02-24 NOTE — Progress Notes (Signed)
Pt noted in Sinus rhythm in 60's, confirmed by monitor tech who was asked to save strip. Dr. Neil Crouch paged via [email or web address] and notified.

## 2016-02-24 NOTE — Progress Notes (Signed)
Patient ID: Douglas Parker Sr., male   DOB: 1927-03-23, 82 y.o.   MRN: [id number]   Patient Name: Douglas Parker Sr. Date of Encounter: 02/24/2016  Primary Cardiologist: [facility] Problem List     Principal Problem:   Atrial fibrillation with RVR ([place]) Active Problems:   Chronic diastolic congestive heart failure (HCC)   S/P TAVR (transcatheter aortic valve replacement)   Hypoglycemia   History of ischemic left MCA stroke   Anemia in other chronic diseases classified elsewhere     Subjective   No complaints but not very communicative   Inpatient Medications    Scheduled Meds: . apixaban  2.5 mg Oral BID  . aspirin EC  81 mg Oral Daily  . atorvastatin  40 mg Oral Daily  . diltiazem  60 mg Oral Q6H  . finasteride  5 mg Oral Daily  . furosemide  20 mg Oral Once per day on Mon Wed Fri Sat  . gabapentin  100 mg Oral TID  . metoprolol tartrate  50 mg Oral BID  . modafinil  100 mg Oral Daily  . potassium chloride SA  20 mEq Oral Once per day on Mon Wed Fri Sat  . sildenafil  20 mg Oral TID   Continuous Infusions:  PRN Meds: acetaminophen **OR** acetaminophen, RESOURCE THICKENUP CLEAR   Vital Signs    Vitals:   02/23/16 1544 02/23/16 1945 02/23/16 2302 02/24/16 0334  BP: 104/74 104/77 91/71 (!) 147/112  Pulse: (!) 111 (!) 127 87 (!) 110  Resp: 15 18 (!) 21 14  Temp: 98 F (36.7 C) 98.2 F (36.8 C) 98.1 F (36.7 C) 97.8 F (36.6 C)  TempSrc: Oral Axillary Axillary Axillary  SpO2: 100% 91% 97% 94%  Weight:    140 lb (63.5 kg)    Intake/Output Summary (Last 24 hours) at 02/24/16 0800 Last data filed at 02/23/16 1900  Gross per 24 hour  Intake              180 ml  Output              150 ml  Net               30 ml   Filed Weights   02/23/16 0300 02/24/16 0334  Weight: 139 lb 9.6 oz (63.3 kg) 140 lb (63.5 kg)    Physical Exam    [id number] chronically ill white male .  HEENT: Grossly normal.  Neck: Supple, no JVD, carotid bruits, or  masses. Cardiac: RRR, AS  murmurs, rubs, or gallops. No clubbing, cyanosis, edema.  Radials/DP/PT 2+ and equal bilaterally.  Respiratory:  Respirations regular and unlabored, clear to auscultation bilaterally. GI: Soft, nontender, nondistended, BS + x 4. MS: no deformity or atrophy. Skin: warm and dry, no rash. Neuro:  Right hemi paresis . Psych: AAOx3.  Normal affect.  Labs    CBC  Recent Labs  02/22/16 2150 02/23/16 0307 02/24/16 0410  WBC 6.8 6.1 9.0  NEUTROABS 5.1  --   --   HGB 11.1* 10.9* 11.0*  HCT 35.0* 34.5* 34.3*  MCV 93.3 93.0 93.0  PLT 132* 130* Q000111Q*   Basic Metabolic Panel  Recent Labs  02/23/16 0307 02/24/16 0410  NA 148* 147*  K 3.7 3.9  CL 111 111  CO2 31 28  GLUCOSE 104* 114*  BUN 46* 41*  CREATININE 1.15 1.08  CALCIUM 8.9 8.7*  MG 2.4  --    Liver Function Tests  Recent Labs  02/22/16 2150 02/23/16 0307  AST 19 18  ALT 15* 15*  ALKPHOS 53 52  BILITOT 0.6 0.7  PROT 6.0* 5.9*  ALBUMIN 3.3* 3.2*   No results for input(s): LIPASE, AMYLASE in the last 72 hours. Cardiac Enzymes  Recent Labs  02/23/16 0307  TROPONINI 0.09*   Thyroid Function Tests  Recent Labs  02/23/16 0307  TSH 0.764    Telemetry    Rapid afib rates 140 - Personally Reviewed  ECG    Afib no acute ST/T wave changes  - Personally Reviewed  Radiology    Ct Head Wo Contrast  Result Date: 02/23/2016 CLINICAL DATA:  RIGHT-sided weakness. Recent LEFT MCA territory infarct. Altered mental status. Fall. History of hypertension, hyperlipidemia, skin cancer. EXAM: CT HEAD WITHOUT CONTRAST TECHNIQUE: Contiguous axial images were obtained from the base of the skull through the vertex without intravenous contrast. COMPARISON:  MRI head February 11, 2016 FINDINGS: BRAIN: The ventricles and sulci are normal for age. No intraparenchymal hemorrhage, mass effect nor midline shift. Patchy supratentorial white matter hypodensities less than expected for patient's age, though  non-specific are most compatible with chronic small vessel ischemic disease. Evolving LEFT frontal/ insula infarct. No acute large vascular territory infarcts. No abnormal extra-axial fluid collections. Basal cisterns are patent. VASCULAR: Mild calcific atherosclerosis of the carotid siphons. SKULL: No skull fracture. No significant scalp soft tissue swelling. SINUSES/ORBITS: The mastoid air-cells and included paranasal sinuses are well-aerated.The included ocular globes and orbital contents are non-suspicious. OTHER: Small amount of subcutaneous gas within LEFT infratemporal fossa most compatible with recent intravenous access. IMPRESSION: No acute intracranial process. Evolving small LEFT MCA territory infarct without hemorrhagic conversion. Otherwise negative CT HEAD for age. Electronically Signed   By: Elon Alas M.D.   On: 02/23/2016 00:22   Dg Chest Portable 1 View  Result Date: 02/22/2016 CLINICAL DATA:  81 y/o M; chest pain, shortness of breath, and atrial fibrillation. EXAM: PORTABLE CHEST 1 VIEW COMPARISON:  02/09/2016 chest radiograph. FINDINGS: Stable mild cardiomegaly. Stented aortic valve replacement. Calcified pleural plaques. Interval improvement in bilateral pleural effusions. Aortic atherosclerosis with arch calcification. No new focal consolidation of the lungs. Moderate dextrocurvature of the lower thoracic spine. IMPRESSION: Interval improvement in bilateral pleural effusions. No new acute pulmonary process. Electronically Signed   By: Kristine Garbe M.D.   On: 02/22/2016 22:54    Cardiac Studies   Echo pending   Patient Profile     82 y.o. with recent stroke admitted with rapid afib  Assessment & Plan    Afib: started on low dose eliquis yesterday 2.5 bid. Increase cardizem to 60 q6 and add lopressor 50 bid For better rate control.  Plan per primary service and palliative care DNR  Signed, Jenkins Rouge, MD  02/24/2016, 8:00 AM

## 2016-02-24 NOTE — Progress Notes (Signed)
Responded to spiritual Care consult to visit with patient and support patient and staff. Patient and family to meet with Doctor for family consult at 11am. Will follow as needed.   02/24/16 1000  Clinical Encounter Type  Visited With Patient;Health care provider  Visit Type Initial;Spiritual support  Referral From Nurse  Spiritual Encounters  Spiritual Needs Prayer  Jaclynn Major, [place]

## 2016-02-25 DIAGNOSIS — Z7189 Other specified counseling: Secondary | ICD-10-CM

## 2016-02-25 DIAGNOSIS — R251 Tremor, unspecified: Secondary | ICD-10-CM

## 2016-02-25 MED ORDER — LORAZEPAM 2 MG/ML IJ SOLN
0.5000 mg | Freq: Two times a day (BID) | INTRAMUSCULAR | Status: DC
  Administered 2016-02-25 – 2016-02-26 (×3): 0.5 mg via INTRAVENOUS
  Filled 2016-02-25 (×3): qty 1

## 2016-02-25 MED ORDER — RESOURCE THICKENUP CLEAR PO POWD: 1.0000 | ORAL | 0 refills | Status: AC | PRN

## 2016-02-25 MED ORDER — METOPROLOL TARTRATE 50 MG PO TABS: 50.0000 mg | ORAL_TABLET | Freq: Two times a day (BID) | ORAL | 0 refills | Status: AC

## 2016-02-25 MED ORDER — DILTIAZEM HCL 60 MG PO TABS: 60.0000 mg | ORAL_TABLET | Freq: Four times a day (QID) | ORAL | 0 refills | Status: AC

## 2016-02-25 MED ORDER — ONDANSETRON 4 MG PO TBDP: 4.0000 mg | ORAL_TABLET | Freq: Four times a day (QID) | ORAL | 0 refills | Status: AC | PRN

## 2016-02-25 MED ORDER — LORAZEPAM 2 MG/ML IJ SOLN: 0.5000 mg | Freq: Two times a day (BID) | INTRAMUSCULAR | Status: DC

## 2016-02-25 MED ORDER — MORPHINE SULFATE (CONCENTRATE) 10 MG/0.5ML PO SOLN: 10.0000 mg | ORAL | 0 refills | Status: AC | PRN

## 2016-02-25 MED ORDER — BIOTENE DRY MOUTH MT LIQD: 15.0000 mL | OROMUCOSAL | 0 refills | Status: AC | PRN

## 2016-02-25 MED ORDER — ACETAMINOPHEN 650 MG RE SUPP: 650.0000 mg | Freq: Four times a day (QID) | RECTAL | 0 refills | Status: AC | PRN

## 2016-02-25 MED ORDER — APIXABAN 2.5 MG PO TABS: 2.5000 mg | ORAL_TABLET | Freq: Two times a day (BID) | ORAL | 0 refills | Status: AC

## 2016-02-25 NOTE — Consult Note (Signed)
[facility] [employer]  Received request from [place] for family interest in [place]. Chart reviewed and received report from RN. Unfortunately [employer] does not have room to offer patient this morning. Sent message to [place] making her aware earlier this morning. Spoke with patient's son [name] by phone to acknowledge referral and make him aware.   Thank you,  Erling Conte, LCSW [phone]

## 2016-02-25 NOTE — Progress Notes (Signed)
Daily Progress Note   Patient Name: Douglas SENTERS Sr.       Date: 02/25/2016 DOB: 1927/04/08  Age: 82 y.o. MRN#: [id number] Attending Physician: Robbie Lis, MD Primary Care Physician: Elsie Stain, MD Admit Date: 02/22/2016  Reason for Consultation/Follow-up: Non pain symptom management and Psychosocial/spiritual support  Subjective: Patient is sleeping but appears uncomfortable.  When I touch him his whole body has a strong tremor (convulsion?).  I spoke with son [name]) on the phone and expressed concern.  Interestingly the patient's pulse and blood pressure look good, but he is not responsive to voice or light touch.  [name] mentions that last night his father was picking things out of the air and at times he seemed to be hallucinating.    Length of Stay: 1  Current Medications: Scheduled Meds:  . apixaban  2.5 mg Oral BID  . diltiazem  60 mg Oral Q6H  . finasteride  5 mg Oral Daily  . furosemide  20 mg Oral Once per day on Mon Wed Fri Sat  . gabapentin  100 mg Oral TID  . LORazepam  0.5 mg Intravenous BID  . metoprolol tartrate  50 mg Oral BID  . modafinil  100 mg Oral Daily  . sildenafil  20 mg Oral TID  . sodium chloride flush  3 mL Intravenous Q12H    Continuous Infusions:   PRN Meds: sodium chloride, acetaminophen **OR** acetaminophen, antiseptic oral rinse, glycopyrrolate **OR** glycopyrrolate **OR** glycopyrrolate, haloperidol lactate, morphine CONCENTRATE **OR** morphine CONCENTRATE, ondansetron **OR** ondansetron (ZOFRAN) IV, polyvinyl alcohol, RESOURCE THICKENUP CLEAR, sodium chloride flush  Physical Exam  Constitutional:  Frail, sleeping. Does not respond to voice.  Convulses at touch.  HENT:  Head: Normocephalic and atraumatic.  Cardiovascular: Normal  rate.   Murmur heard.    Pulmonary/Chest: Effort normal. No respiratory distress.  Coarse breath sounds  Abdominal: Soft. Bowel sounds are normal. He exhibits no distension.  Musculoskeletal: He exhibits no edema or deformity.  Generalized weakness.  Neurological: He displays abnormal reflex.  Does not respond verbally or open eyes.    Skin: Skin is warm and dry.  Psychiatric:  Unable to assess.           Vital Signs: BP 137/79   Pulse 87   Temp 99.1 F (37.3 C) (Axillary)   Resp Marland Kitchen)  22   Wt 63.7 kg (140 lb 6.4 oz)   SpO2 92%   BMI 20.15 kg/m  SpO2: SpO2: 92 % O2 Device: O2 Device: Not Delivered O2 Flow Rate: O2 Flow Rate (L/min): 4 L/min  Intake/output summary:   Intake/Output Summary (Last 24 hours) at 02/25/16 1343 Last data filed at 02/24/16 1900  Gross per 24 hour  Intake              240 ml  Output              500 ml  Net             -260 ml   LBM: Last BM Date: 02/22/16 Baseline Weight: Weight: 63.3 kg (139 lb 9.6 oz) Most recent weight: Weight: 63.7 kg (140 lb 6.4 oz)       Palliative Assessment/Data:    Flowsheet Rows   Flowsheet Row Most Recent Value  Intake Tab  Referral Department  Hospitalist  Unit at Time of Referral  Intermediate Care Unit  Palliative Care Primary Diagnosis  Cardiac  Date Notified  02/23/16  Palliative Care Type  New Palliative care  Reason for referral  Clarify Goals of Care  Date of Admission  02/22/16  Date first seen by Palliative Care  02/23/16  # of days Palliative referral response time  0 Day(s)  # of days IP prior to Palliative referral  1  Clinical Assessment  Palliative Performance Scale Score  10%  Psychosocial & Spiritual Assessment  Palliative Care Outcomes  Patient/Family meeting held?  Yes  Who was at the meeting?  son, dtr in law, and cousin Joe  Palliative Care Outcomes  Clarified goals of care, Counseled regarding hospice, Changed to focus on comfort, Improved pain interventions, Improved non-pain  symptom therapy  Patient/Family wishes: Interventions discontinued/not started   Mechanical Ventilation      Patient Active Problem List   Diagnosis Date Noted  . A-fib ([place]) 02/24/2016  . Thrombocytopenia ([place])   . Troponin level elevated   . Goals of care, counseling/discussion   . Palliative care encounter   . Encounter for hospice care discussion   . Atrial fibrillation with RVR ([place]) 02/23/2016  . Hypoglycemia 02/23/2016  . History of ischemic left MCA stroke 02/23/2016  . Anemia in other chronic diseases classified elsewhere 02/23/2016  . Acute CVA (cerebrovascular accident) ([place]) 02/09/2016  . Pneumonia 01/05/2016  . Atypical chest pain 12/01/2015  . Cervical lymphadenopathy 04/20/2015  . BPH (benign prostatic hyperplasia) 02/10/2014  . Dyslipidemia 02/10/2014  . GERD (gastroesophageal reflux disease) 02/10/2014  . Severe aortic valve stenosis 02/03/2014  . 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 Aortic stenosis, severe 12/19/2013  . Chronic diastolic congestive heart failure ([place])   . Back pain   . Edema 11/20/2013  . Loss of weight 06/26/2013  . Cornea scar 04/09/2013  . Chronic glaucoma 12/17/2012  . Aortic valve stenosis 09/10/2012  . Pseudoaphakia 06/28/2012  . Living will, counseling/discussion 12/28/2011  . 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 Degeneration macular 03/06/2011  . Constipation 01/02/2007  . Hypertensive heart disease with CHF (congestive heart failure) ([place]) 05/19/2003  . CA IN SITU, SKIN NOS 05/19/1999  . Arthropathy of pelvic region and thigh 05/18/1997  . HEMORRHOIDS, INTERNAL, WITH BLEEDING 03/20/1994  . DIVERTICULOSIS, COLON 02/17/1994  . FX OPEN MLT PELVIS W/PELVIC CIRCULAT DISRUPT 03/20/1986  . SCOLIOSIS, THORACIC SPINE 03/20/1944    Palliative Care Assessment & Plan   Patient Profile: 82 y.o. male  with past medical history of aortic valve replacement, diastolic heart failure, scoliosis, recent left MCA  CVA (02/09/16) and severe pulmonary hypertension, and aspiration pneumonia who was admitted on 02/22/2016 with recurrent right sided weakness and new onset afib.  The patient is having pulse rates between 110 - 150.  Initially Cardiology recommended Cardizem gtt but this was discontinued due to hypotension.  Cardizem PO is being started.  Due to low BP it is also difficult to diuresis him to control symptoms of his DHF and PHTN.  On 12/8 patient appears to have continued decline.  He is not awake, not verbal and experiencing strong tremors/convulsions when touched.  Recommendations/Plan:  DNR/DNI  Family wants Hospice House at discharge [facility] or [place] - family is centrally located)  Shift to full comfort after the patient is as "tuned up" as he can be.  Will D/C all meds not related to comfort.  If he continues to decline he will pass here in the hospital.  Otherwise he is only appropriate for hospice house.  I will start scheduled low dose ativan with holding parameters for tremors/convulsions   Prognosis:   Hours - Days secondary to afib with RVR, hypotension, recurrent aspiration, MCA stroke, pulmonary hypertension, inability to walk or feed himself.  Now with decreased consciousness  Discharge Planning:  Hospice facility vs Hospital death.  Care plan was discussed with family and [facility] attending MD  Thank you for allowing the Palliative Medicine Team to assist in the care of this patient.   Time In: 1:10 Time Out: 1:45 Total Time 35 Prolonged Time Billed no      Greater than 50%  of this time was spent counseling and coordinating care related to the above assessment and plan.  Imogene Burn, PA-C Palliative Medicine   Please contact Palliative MedicineTeam phone at [phone] for questions and concerns.   Please see AMION for individual provider pager numbers.

## 2016-02-25 NOTE — Progress Notes (Signed)
[place] for Eliquis Indication: atrial fibrillation  No Known Allergies  Patient Measurements: Weight: 140 lb 6.4 oz (63.7 kg)  Vital Signs: Temp: 98.3 F (36.8 C) (12/08 0740) Temp Source: Axillary (12/08 0740) BP: 153/73 (12/08 0740) Pulse Rate: 86 (12/08 0740)  Labs:  Recent Labs  02/22/16 2150 02/23/16 0307 02/24/16 0410  HGB 11.1* 10.9* 11.0*  HCT 35.0* 34.5* 34.3*  PLT 132* 130* 129*  LABPROT 16.0* 15.6*  --   INR 1.27 1.23  --   CREATININE 1.19 1.15 1.08  TROPONINI  --  0.09*  --     Estimated Creatinine Clearance: 44.2 mL/min (by C-G formula based on SCr of 1.08 mg/dL).   Assessment: 82 YOM here with new onset AFib, started on diltiazem for rate control. CHADS2VASC = 6 and to start Eliquis for anticoagulation, not on any anticoagulation PTA. Patient with recent MCA stroke (in November 2017).  Over 34 years old, and with borderline weight (63kg- appears to be decreasing over past month or so as well), Scr 1.08. CBC stable, no bleeding noted.  Goal of Therapy:  proper dosing based on renal and hepatic function Monitor platelets by anticoagulation protocol: Yes   Plan:  -Eliquis 2.5mg  BID -Noted plans for hospice at discharge -Pharmacy to sign off at this time as no dose changes anticipated  Bryan Omura D. Loreda Silverio, PharmD, BCPS Clinical Pharmacist Pager: [phone] 02/25/2016 10:07 AM

## 2016-02-25 NOTE — Discharge Summary (Signed)
Physician Discharge Summary  Douglas Degood Vigen Sr. [id number] DOB: [birth year] DOA: 02/22/2016  PCP: Elsie Stain, MD  Admit date: 02/22/2016 Discharge date: 02/25/2016  Recommendations for Outpatient Follow-up:  1. May continue PO meds if alert enough. May continue apixaban, metoprolol, lasix  Discharge Diagnoses:  Principal Problem:   Atrial fibrillation with RVR (HCC) Active Problems:   Chronic diastolic congestive heart failure (HCC)   S/P TAVR (transcatheter aortic valve replacement)   Hypoglycemia   History of ischemic left MCA stroke   Anemia in other chronic diseases classified elsewhere   Thrombocytopenia (HCC)   Troponin level elevated   Goals of care, counseling/discussion   Palliative care encounter   Encounter for hospice care discussion   A-fib [facility])    Discharge Condition: stable   Diet recommendation: as tolerated   History of present illness:  82 year old male with past medical history significant for severe aortic stenosis s/p TAVR in 2015, HFpEF, HTN, and severe pHTNwho presented to [facility] recurrent right sided weakness and subsequently found to have new Afib. The patient was admitted to [facility] from 11/22-11/26 for new left MCA stroke with a proximal left M2 MCA occlusion not amenable to intervention. During that hospitalization, he was noted to have a pneumonia, likely aspiration treated with levaquin. Per family report, ptwas getting more energetic, able to walk short distances with therapy, oriented. However, over the past two days PTA, he has started to decline again. He was not able to stand up without the assistance. His speech was incoherent. He was subsequently transferred to ER for evaluation.  In ED, blood pressure was 87/56, heart rate 32-144, oxygen saturation 83% on room air which has improved to 100% with [employer] oxygen support. Blood work was notable for hemoglobin of 11.4, sodium 150, normal creatinine, normal lactic acid. BNP was 552, normal  troponin. Because of atrial fibrillation, patient was started on Cardizem drip.  Per cardio, transitioned to PO Cardizem and metoprolol for heart rate control. PCT has seen the pt in consultation and family prefers comfort care.  Hospital Course:   Assessment & Plan:   New Afib with RVR - CHADS2Vasc 5 (for age, stroke, CHF, HTN) - Cardiology consulted - Pt was cn Cardizem drip but had to hold it due to soft BP on 12/6 - Cardiology changed from Cardizem drip to Cardizem 60 mg by mouth every 6 hours along with metoprolol 50 mg twice daily - Cardiology started Apixaban 2.5 mg BID. Will hold aspirin due to risk of bleeding   Essential hypertension - Continue metoprolol, Cardizem, Lasix  Mild troponin elevation - Likely demand ischemia from new atrial fibrillation - No further blood work to ensure comfort   Hypernatremia - Likely dehydration - Sodium stable   Thrombocytopenia - Possibly related to aspirin - No further blood draw as focus on comfort care   Acute on chronic diastolic CHF - Soft blood pressure limiting aggressive diuresis - He is on Lasix 20 mg Monday, Wednesday, Friday and Saturday  S/p TVAR - Recent echo showed normal valve function  Pulmonary hypertension - Continue Revatio  Dyslipidemia - Lipitor stopped per PCT conversation with family   Left MCA stroke - Recent hospitalization - On Apixaban     DVT prophylaxis: Eliquis  Code Status: DNR/DNI Family Communication: no family at the bedside this am, spoke with pt son over the phone this am to inform of pt discharge plan once bed available    Consultants:   Cardiology  PCT  Procedures:   None  Antimicrobials:   None    Signed:  Leisa Lenz, MD  [facility] 02/25/2016, 9:58 AM  Pager #: [phone]  Time spent in minutes: less than 30 minutes    Discharge Exam: Vitals:   02/25/16 0400 02/25/16 0740  BP: (!) 139/105 (!) 153/73  Pulse: 83 86  Resp: 15  15  Temp:  98.3 F (36.8 C)   Vitals:   02/24/16 2338 02/25/16 0322 02/25/16 0400 02/25/16 0740  BP: 94/63  (!) 139/105 (!) 153/73  Pulse: 77  83 86  Resp: (!) 21  15 15   Temp: 98.3 F (36.8 C) 97.3 F (36.3 C)  98.3 F (36.8 C)  TempSrc: Axillary Oral  Axillary  SpO2: 94%  90% 92%  Weight:  63.7 kg (140 lb 6.4 oz)      General: Pt is not in acute distress Cardiovascular: Rate controlled, S1/S2 + Respiratory: diminished, no wheezing Abdominal: Soft, non tender, non distended, bowel sounds + Extremities: no cyanosis, pulses palpable bilaterally DP and PT Neuro: Responds to painful stimuli   Discharge Instructions  Discharge Instructions    Call MD for:  persistant nausea and vomiting    Complete by:  As directed    Call MD for:  redness, tenderness, or signs of infection (pain, swelling, redness, odor or green/yellow discharge around incision site)    Complete by:  As directed    Call MD for:  severe uncontrolled pain    Complete by:  As directed    Diet - low sodium heart healthy    Complete by:  As directed    Increase activity slowly    Complete by:  As directed        Medication List    STOP taking these medications   aspirin 81 MG EC tablet   atorvastatin 20 MG tablet Commonly known as:  LIPITOR   multivitamin with minerals Tabs tablet   potassium chloride SA 20 MEQ tablet Commonly known as:  K-DUR,KLOR-CON     TAKE these medications   acetaminophen 650 MG suppository Commonly known as:  TYLENOL Place 1 suppository (650 mg total) rectally every 6 (six) hours as needed for mild pain (or Fever >/= 101).   antiseptic oral rinse Liqd Apply 15 mLs topically as needed for dry mouth.   apixaban 2.5 MG Tabs tablet Commonly known as:  ELIQUIS Take 1 tablet (2.5 mg total) by mouth 2 (two) times daily.   diltiazem 60 MG tablet Commonly known as:  CARDIZEM Take 1 tablet (60 mg total) by mouth every 6 (six) hours.   finasteride 5 MG tablet Commonly known  as:  PROSCAR Take 1 tablet (5 mg total) by mouth daily.   furosemide 20 MG tablet Commonly known as:  LASIX Take 1 tablet (20 mg total) by mouth daily. On Mon, Wed, Fri and sat What changed:  when to take this  additional instructions   gabapentin 100 MG capsule Commonly known as:  NEURONTIN Take 1 capsule (100 mg total) by mouth 3 (three) times daily. For burning pain   metoprolol 50 MG tablet Commonly known as:  LOPRESSOR Take 1 tablet (50 mg total) by mouth 2 (two) times daily.   modafinil 100 MG tablet Commonly known as:  PROVIGIL Take 1 tablet (100 mg total) by mouth daily.   morphine CONCENTRATE 10 MG/0.5ML Soln concentrated solution Take 0.5 mLs (10 mg total) by mouth every 2 (two) hours as needed for moderate pain (or dyspnea).   ondansetron 4 MG disintegrating tablet  Commonly known as:  ZOFRAN-ODT Take 1 tablet (4 mg total) by mouth every 6 (six) hours as needed for nausea.   RESOURCE THICKENUP CLEAR Powd Take 120 g by mouth as needed.   REVATIO 20 MG tablet Generic drug:  sildenafil Take 1 tablet (20 mg total) by mouth 3 (three) times daily.      Follow-up Information    Elsie Stain, MD Follow up.   Specialty:  Family Medicine Contact information: [place] [address] [phone]            The results of significant diagnostics from this hospitalization (including imaging, microbiology, ancillary and laboratory) are listed below for reference.    Significant Diagnostic Studies: Dg Chest 2 View  Result Date: 02/09/2016 CLINICAL DATA:  Right-sided weakness and facial droop EXAM: CHEST  2 VIEW COMPARISON:  02/08/2016 FINDINGS: Valvular prosthesis again visualized. Bilateral calcified pleural plaques. Bilateral calcified nodules unchanged. No change in small bilateral pleural effusions. Stable mild cardiomegaly with mild central congestion. No pneumothorax. IMPRESSION: 1. No significant interval change in small bilateral  pleural effusions and mild left basilar opacity. 2. Stable mild cardiomegaly.  There is mild central congestion. 3. Calcified pleural plaques and small calcified lung nodules as before. Electronically Signed   By: Donavan Foil M.D.   On: 02/09/2016 20:44   Dg Chest 2 View  Result Date: 02/08/2016 CLINICAL DATA:  Shortness of breath, history of pneumonia EXAM: CHEST  2 VIEW COMPARISON:  01/11/2016, 01/10/2016, 02/04/2014 FINDINGS: There are small bilateral pleural effusions, unchanged on the right and decreased on the left. Overall improved aeration of the left lung base with residual airspace opacity noted. Stable cardiomediastinal silhouette with stent. Bilateral nodular opacities and calcified pleural plaques are grossly unchanged. Atherosclerosis. No pneumothorax. IMPRESSION: 1. Small right-sided pleural effusion, unchanged. Small left-sided pleural effusion, decreased compared to prior. 2. Mild residual left basilar opacity, atelectasis versus infiltrate. 3. Otherwise no significant interval changes. Electronically Signed   By: Donavan Foil M.D.   On: 02/08/2016 22:49   Ct Head Wo Contrast  Result Date: 02/23/2016 CLINICAL DATA:  RIGHT-sided weakness. Recent LEFT MCA territory infarct. Altered mental status. Fall. History of hypertension, hyperlipidemia, skin cancer. EXAM: CT HEAD WITHOUT CONTRAST TECHNIQUE: Contiguous axial images were obtained from the base of the skull through the vertex without intravenous contrast. COMPARISON:  MRI head February 11, 2016 FINDINGS: BRAIN: The ventricles and sulci are normal for age. No intraparenchymal hemorrhage, mass effect nor midline shift. Patchy supratentorial white matter hypodensities less than expected for patient's age, though non-specific are most compatible with chronic small vessel ischemic disease. Evolving LEFT frontal/ insula infarct. No acute large vascular territory infarcts. No abnormal extra-axial fluid collections. Basal cisterns are patent.  VASCULAR: Mild calcific atherosclerosis of the carotid siphons. SKULL: No skull fracture. No significant scalp soft tissue swelling. SINUSES/ORBITS: The mastoid air-cells and included paranasal sinuses are well-aerated.The included ocular globes and orbital contents are non-suspicious. OTHER: Small amount of subcutaneous gas within LEFT infratemporal fossa most compatible with recent intravenous access. IMPRESSION: No acute intracranial process. Evolving small LEFT MCA territory infarct without hemorrhagic conversion. Otherwise negative CT HEAD for age. Electronically Signed   By: Elon Alas M.D.   On: 02/23/2016 00:22   Mr Brain Wo Contrast  Result Date: 02/11/2016 CLINICAL DATA:  Right-sided weakness and facial droop with slurred speech. EXAM: MRI HEAD WITHOUT CONTRAST MRA HEAD WITHOUT CONTRAST TECHNIQUE: Multiplanar, multiecho pulse sequences of the brain and surrounding structures were obtained without  intravenous contrast. Angiographic images of the head were obtained using MRA technique without contrast. COMPARISON:  Head CT 02/09/2016 FINDINGS: MRI HEAD FINDINGS Brain: As seen on recent head CT, there is an acute left MCA territory infarct. This involves the left frontal lobe (predominantly operculum), insula, portions of the caudate and lentiform nuclei, and corona radiata. There is no evidence of associated hemorrhage. There is cytotoxic edema without significant mass effect. No mass, midline shift, or extra-axial fluid collection is present. There is a chronic microhemorrhage in the superior cerebellar vermis right of midline. Small foci of white matter T2 hyperintensity scattered throughout both cerebral hemispheres are nonspecific but compatible with mild chronic small vessel ischemic disease. There is a chronic lacunar infarct in the white matter lateral to the left thalamus. Vascular: Abnormal FLAIR signal near the left MCA bifurcation is more fully evaluated below. The right vertebral  artery is dominant. Skull and upper cervical spine: No focal marrow lesion. Sinuses/Orbits: Prior bilateral cataract extraction. Paranasal sinuses and mastoid air cells are clear. Other: None. MRA HEAD FINDINGS There is mild motion artifact. The visualized distal right vertebral artery is patent and dominant. There is poor flow related enhancement in the left V4 segment which may be partly technical in nature due to the vessel's somewhat small size and mild motion artifact as well as due to underlying stenosis, not well evaluated. PICA origins are grossly patent. Basilar artery is patent without stenosis. SCA origins are patent. There is a patent right posterior communicating artery. PCAs are patent without evidence of significant proximal stenosis. Internal carotid arteries are patent from skullbase to carotid termini without evidence of significant stenosis. M1 segments are widely patent bilaterally. There is a proximal left M2 occlusion corresponding to the acute infarct. ACAs are patent without evidence of significant stenosis. Mild right A1 segment irregularity is attributed to motion artifact. No intracranial aneurysm is identified. IMPRESSION: 1. Acute left MCA infarct predominantly involving the frontal operculum and basal ganglia. 2. Proximal left M2 MCA branch occlusion. Electronically Signed   By: Logan Bores M.D.   On: 02/11/2016 15:00   US Carotid Bilateral (at [facility] And [facility] Only)  Result Date: 02/10/2016 CLINICAL DATA:  CVA.  History of hypertension and hyperlipidemia. EXAM: BILATERAL CAROTID DUPLEX ULTRASOUND TECHNIQUE: Pearline Cables scale imaging, color Doppler and duplex ultrasound were performed of bilateral carotid and vertebral arteries in the neck. COMPARISON:  None. FINDINGS: Criteria: Quantification of carotid stenosis is based on velocity parameters that correlate the residual internal carotid diameter with NASCET-based stenosis levels, using the diameter of the distal internal carotid lumen as  the denominator for stenosis measurement. The following velocity measurements were obtained: RIGHT ICA:  101/21 cm/sec CCA:  [id number] cm/sec SYSTOLIC ICA/CCA RATIO:  1.1 DIASTOLIC ICA/CCA RATIO:  1.6 ECA:  125 cm/sec LEFT ICA:  113/23 cm/sec CCA:  [id number] cm/sec SYSTOLIC ICA/CCA RATIO:  1.2 DIASTOLIC ICA/CCA RATIO:  1.6 ECA:  125 cm/sec RIGHT CAROTID ARTERY: There is a minimal amount of eccentric mixed echogenic plaque within the right carotid bulb (images 12 and 13), extending to involve the origin proximal aspect the right internal carotid artery (image 20), not resulting in elevated peak systolic velocities within the interrogated course the right internal carotid artery to suggest a hemodynamically significant stenosis. RIGHT VERTEBRAL ARTERY:  Antegrade Flow LEFT CAROTID ARTERY: There is a minimal amount of focal eccentric echogenic plaque involving the proximal (images 31 and 32) and mid (images 35 and 36) aspects of the left common carotid artery. There is a  minimal to moderate amount of eccentric mixed echogenic plaque within the left carotid bulb (images 44 and 45). There is a moderate to large amount of eccentric mixed echogenic plaque involving the origin and proximal aspects of the left internal carotid artery (image 53), not resulting in elevated peak systolic velocities within the interrogated course of the left internal carotid artery to suggest a hemodynamically significant stenosis. LEFT VERTEBRAL ARTERY:  Antegrade Flow IMPRESSION: 1. Moderate to large amount of left-sided atherosclerotic plaque, not resulting in hemodynamically significant stenosis. 2. Minimal amount of right-sided atherosclerotic plaque, not resulting in a hemodynamically significant stenosis. Electronically Signed   By: Sandi Mariscal M.D.   On: 02/10/2016 10:37   Dg Chest Portable 1 View  Result Date: 02/22/2016 CLINICAL DATA:  82 y/o M; chest pain, shortness of breath, and atrial fibrillation. EXAM: PORTABLE CHEST 1 VIEW  COMPARISON:  02/09/2016 chest radiograph. FINDINGS: Stable mild cardiomegaly. Stented aortic valve replacement. Calcified pleural plaques. Interval improvement in bilateral pleural effusions. Aortic atherosclerosis with arch calcification. No new focal consolidation of the lungs. Moderate dextrocurvature of the lower thoracic spine. IMPRESSION: Interval improvement in bilateral pleural effusions. No new acute pulmonary process. Electronically Signed   By: Kristine Garbe M.D.   On: 02/22/2016 22:54   Mr Jodene Nam Head/brain [id number] Cm  Result Date: 02/11/2016 CLINICAL DATA:  Right-sided weakness and facial droop with slurred speech. EXAM: MRI HEAD WITHOUT CONTRAST MRA HEAD WITHOUT CONTRAST TECHNIQUE: Multiplanar, multiecho pulse sequences of the brain and surrounding structures were obtained without intravenous contrast. Angiographic images of the head were obtained using MRA technique without contrast. COMPARISON:  Head CT 02/09/2016 FINDINGS: MRI HEAD FINDINGS Brain: As seen on recent head CT, there is an acute left MCA territory infarct. This involves the left frontal lobe (predominantly operculum), insula, portions of the caudate and lentiform nuclei, and corona radiata. There is no evidence of associated hemorrhage. There is cytotoxic edema without significant mass effect. No mass, midline shift, or extra-axial fluid collection is present. There is a chronic microhemorrhage in the superior cerebellar vermis right of midline. Small foci of white matter T2 hyperintensity scattered throughout both cerebral hemispheres are nonspecific but compatible with mild chronic small vessel ischemic disease. There is a chronic lacunar infarct in the white matter lateral to the left thalamus. Vascular: Abnormal FLAIR signal near the left MCA bifurcation is more fully evaluated below. The right vertebral artery is dominant. Skull and upper cervical spine: No focal marrow lesion. Sinuses/Orbits: Prior bilateral cataract  extraction. Paranasal sinuses and mastoid air cells are clear. Other: None. MRA HEAD FINDINGS There is mild motion artifact. The visualized distal right vertebral artery is patent and dominant. There is poor flow related enhancement in the left V4 segment which may be partly technical in nature due to the vessel's somewhat small size and mild motion artifact as well as due to underlying stenosis, not well evaluated. PICA origins are grossly patent. Basilar artery is patent without stenosis. SCA origins are patent. There is a patent right posterior communicating artery. PCAs are patent without evidence of significant proximal stenosis. Internal carotid arteries are patent from skullbase to carotid termini without evidence of significant stenosis. M1 segments are widely patent bilaterally. There is a proximal left M2 occlusion corresponding to the acute infarct. ACAs are patent without evidence of significant stenosis. Mild right A1 segment irregularity is attributed to motion artifact. No intracranial aneurysm is identified. IMPRESSION: 1. Acute left MCA infarct predominantly involving the frontal operculum and basal ganglia. 2. Proximal left  M2 MCA branch occlusion. Electronically Signed   By: Logan Bores M.D.   On: 02/11/2016 15:00   Ct Head Code Stroke W/o Cm  Result Date: 02/09/2016 CLINICAL DATA:  Code stroke. 82 year old male with confusion right side weakness facial droop and slurred speech. Initial encounter. EXAM: CT HEAD WITHOUT CONTRAST TECHNIQUE: Contiguous axial images were obtained from the base of the skull through the vertex without intravenous contrast. COMPARISON:  None. FINDINGS: Brain: Positive for cytotoxic edema in the left MCA territory at the frontal operculum and involving a small portion of the anterior left insula. No associated hemorrhage or mass effect. Lentiform nuclei and internal capsule appear spared. Posterior insula and operculum are spared. No temporal lobe involvement is  evident. No superior left MCA territory involvement. Normal gray-white matter differentiation in the right hemisphere and posterior fossa. No ventriculomegaly. Patent basilar cisterns. No intracranial mass effect. No acute intracranial hemorrhage identified. Vascular: Calcified atherosclerosis at the skull base. Positive for abnormal hyperdensity at the distal left MCA M1 segment and left MCA bifurcation (series 6, image 43). Skull: No acute osseous abnormality identified. Sinuses/Orbits: Clear. Other: No acute orbit or scalp soft tissue findings. ASPECTS [facility] Stroke Program Early CT Score) - Ganglionic level infarction (caudate, lentiform nuclei, internal capsule, insula, M1-M3 cortex): 5 (minus 2 for the insula and M1 area). - Supraganglionic infarction (M4-M6 cortex): 3 Total score (0-10 with 10 being normal): 8 IMPRESSION: 1. Positive for a left MCA infarct with operculum cytotoxic edema, no associated hemorrhage. Positive hyperdense distal Left M1 suggesting Emergent Large Vessel Occlusion. 2. ASPECTS is 8. 3. Study discussed by telephone with Dr. Meade Maw on 02/09/2016 at 13:13 . Electronically Signed   By: Genevie Ann M.D.   On: 02/09/2016 13:14    Microbiology: Recent Results (from the past 240 hour(s))  Urine culture     Status: None   Collection Time: 02/22/16 10:46 PM  Result Value Ref Range Status   Specimen Description URINE, CLEAN CATCH  Final   Special Requests NONE  Final   Culture NO GROWTH  Final   Report Status 02/24/2016 FINAL  Final  MRSA PCR Screening     Status: None   Collection Time: 02/23/16  2:31 AM  Result Value Ref Range Status   MRSA by PCR NEGATIVE NEGATIVE Final    Comment:        The GeneXpert MRSA Assay (FDA approved for NASAL specimens only), is one component of a comprehensive MRSA colonization surveillance program. It is not intended to diagnose MRSA infection nor to guide or monitor treatment for MRSA infections.      Labs: Basic Metabolic  Panel:  Recent Labs Lab 02/21/16 02/22/16 2150 02/23/16 0307 02/24/16 0410  NA 150* 145 148* 147*  K 3.7 3.9 3.7 3.9  CL  --  110 111 111  CO2  --  28 31 28   GLUCOSE  --  256* 104* 114*  BUN 50* 49* 46* 41*  CREATININE 1.1 1.19 1.15 1.08  CALCIUM  --  8.7* 8.9 8.7*  MG  --   --  2.4  --    Liver Function Tests:  Recent Labs Lab 02/22/16 2150 02/23/16 0307  AST 19 18  ALT 15* 15*  ALKPHOS 53 52  BILITOT 0.6 0.7  PROT 6.0* 5.9*  ALBUMIN 3.3* 3.2*   No results for input(s): LIPASE, AMYLASE in the last 168 hours. No results for input(s): AMMONIA in the last 168 hours. CBC:  Recent Labs Lab 02/21/16 02/22/16 2150  02/23/16 0307 02/24/16 0410  WBC 7.2 6.8 6.1 9.0  NEUTROABS  --  5.1  --   --   HGB 11.4* 11.1* 10.9* 11.0*  HCT 36* 35.0* 34.5* 34.3*  MCV  --  93.3 93.0 93.0  PLT 159 132* 130* 129*   Cardiac Enzymes:  Recent Labs Lab 02/23/16 0307  TROPONINI 0.09*   BNP: BNP (last 3 results)  Recent Labs  01/05/16 1555 02/22/16 2146  BNP 575.0* 552.4*    ProBNP (last 3 results)  Recent Labs  02/08/16 1327  PROBNP 483.0*    CBG:  Recent Labs Lab 02/24/16 0212 02/24/16 0607 02/24/16 0826 02/24/16 1134 02/24/16 1619  GLUCAP 108* 97 116* 122* 98

## 2016-02-25 NOTE — Discharge Instructions (Signed)
Hospice °Hospice is a service that is designed to provide people who are terminally ill and their families with medical, spiritual, and psychological support. Its aim is to improve your quality of life by keeping you as alert and comfortable as possible. Hospice is performed by a team of health care professionals and volunteers who: °· Help keep you comfortable. Hospice can be provided in your home or in a homelike setting. The hospice staff works with your family and friends to help meet your needs. You will enjoy the support of loved ones by receiving much of your basic care from family and friends. °· Provide pain relief and manage your symptoms. The staff supply all necessary medicines and equipment. °· Provide companionship when you are alone. °· Allow you and your family to rest. They may do light housekeeping, prepare meals, and run errands. °· Provide counseling. They will make sure your emotional, spiritual, and social needs and those of your family are being met. °· Provide spiritual care. Spiritual care is individualized to meet your needs and your family's needs. It may involve helping you look at what death means to you, say goodbye, or perform a specific religious ceremony or ritual. °Hospice teams often include: °· A nurse. °· A doctor. °· Social workers. °· Religious leaders (such as a chaplain). °· Trained volunteers. °WHEN SHOULD HOSPICE CARE BEGIN? °Most people who use hospice are believed to have fewer than 6 months to live. Your family and health care providers can help you decide when hospice services should begin. If your condition improves, you may discontinue the program. °WHAT SHOULD I CONSIDER BEFORE SELECTING A PROGRAM? °Most hospice programs are run by nonprofit, independent organizations. Some are affiliated with hospitals, nursing homes, or home health care agencies. Hospice programs can take place in the home or at a hospice center, hospital, or skilled nursing facility. When choosing  a hospice program, ask the following questions: °· What services are available to me? °· What services are offered to my loved ones? °· How involved are my loved ones? °· How involved is my health care provider? °· Who makes up the hospice care team? How are they trained or screened? °· How will my pain and symptoms be managed? °· If my circumstances change, can the services be provided in a different setting, such as my home or in the hospital? °· Is the program reviewed and licensed by the state or certified in some other way? °WHERE CAN I LEARN MORE ABOUT HOSPICE? °You can learn about existing hospice programs in your area from your health care providers. You can also read more about hospice online. The websites of the following organizations contain helpful information: °· The National Hospice and Palliative Care Organization (NHPCO). °· The Hospice Association of America (HAA). °· The Hospice Education Institute. °· The American Cancer Society (ACS). °· Hospice Net. °This information is not intended to replace advice given to you by your health care provider. Make sure you discuss any questions you have with your health care provider. °Document Released: 06/23/2003 Document Revised: 03/11/2013 Document Reviewed: 01/14/2013 °Elsevier Interactive Patient Education © 2017 Elsevier Inc. ° °

## 2016-02-25 NOTE — Progress Notes (Signed)
[facility] does not have availability today  CSW faxed referral to [facility] for review but they are also on a wait list  CSW spoke with son [name] who will speak with family about the other available options and inform CSW of next preference  CSW will continue to follow  Jorge Ny, [place] Social Worker [phone]

## 2016-02-26 DIAGNOSIS — I4891 Unspecified atrial fibrillation: Principal | ICD-10-CM

## 2016-02-26 NOTE — Progress Notes (Signed)
Patient discharged to [facility] for hospice care, report called to nurse Helene Kelp. Patient transported by Corey Harold, family at bedside at time of transfer and aware of plan of care.   Frederico Gerling, Tivis Ringer, RN

## 2016-02-26 NOTE — Progress Notes (Signed)
Patient seen and examined at bedside. Patient is medically stable for transfer to res hosp once bed available. Please refer to discharge summary completed 02/25/2016.  Leisa Lenz [facility] [phone]

## 2016-02-26 NOTE — Progress Notes (Signed)
[facility] - Liaison Note - [place] [address]   Call to [place], [place] to advise of bed available at [facility] today.  Percell Locus confirms that bed is still desired and family will be at hospital after 10am to sign consents.  Moss Mc, RN , BSN, [facility] Director of [place] of [place] liaisons are now on [place].  Please feel free to contact us directly.  I can be reached today at [phone].  Our main number is [phone].

## 2016-02-26 NOTE — Clinical Social Work Placement (Signed)
   CLINICAL SOCIAL WORK PLACEMENT  NOTE  Date:  02/26/2016  Patient Details  Name: Douglas Pfeuffer Hemler Sr. MRN: [id number] Date of Birth: September 20, 1927  Clinical Social Work is seeking post-discharge placement for this patient at the  [facility]) level of care (*CSW will initial, date and re-position this form in  chart as items are completed):  Yes   Patient/family provided with [place] Work Department's list of facilities offering this level of care within the geographic area requested by the patient (or if unable, by the patient's family).  Yes   Patient/family informed of their freedom to choose among providers that offer the needed level of care, that participate in Medicare, Medicaid or managed care program needed by the patient, have an available bed and are willing to accept the patient.  Yes   Patient/family informed of [facility]'s ownership interest in [facility] and [facility], as well as of the fact that they are under no obligation to receive care at these facilities.  PASRR submitted to EDS on       PASRR number received on       Existing PASRR number confirmed on       FL2 transmitted to all facilities in geographic area requested by pt/family on       FL2 transmitted to all facilities within larger geographic area on       Patient informed that his/her managed care company has contracts with or will negotiate with certain facilities, including the following:            Patient/family informed of bed offers received.  Patient chooses bed at       Physician recommends and patient chooses bed at      Patient to be transferred to  [facility]) on 02/26/16.  Patient to be transferred to facility by [facility]     Patient family notified on 02/26/16 (Spoke to son Douglas Parker) of transfer.  Name of family member notified:  Douglas Parker     PHYSICIAN       Additional Comment:     _______________________________________________ Serafina Mitchell, [place] 02/26/2016, 12:13 PM

## 2016-02-26 NOTE — Progress Notes (Signed)
CSW notified by [facility], bed available for pt today, Levada Dy, hospice liason met with pt's family, all consents have been signed.CSW updated nurse and contacted transport.CSW notified family(son [name]) that pt will be transferring to facility within the hour. Pt has no other needs at this time.  CSW is signing off.  Rilynn Habel B. [facility] Maxcy Clinical Social Work Dept Weekend Social Worker [phone] 12:18 PM

## 2016-03-07 ENCOUNTER — Encounter: Payer: Self-pay | Admitting: Internal Medicine

## 2016-03-08 ENCOUNTER — Telehealth: Payer: Self-pay | Admitting: Family Medicine

## 2016-03-08 NOTE — Telephone Encounter (Signed)
Late entry. I was glad to have the opportunity to participate in the care of this patient. I as always glad to see him in the clinic.

## 2016-03-20 DEATH — deceased

## 2016-04-18 ENCOUNTER — Ambulatory Visit: Payer: Medicare Other | Admitting: Cardiovascular Disease

## 2016-06-08 ENCOUNTER — Telehealth: Payer: Self-pay

## 2016-06-08 NOTE — Telephone Encounter (Addendum)
Douglas Parker (no DPR signed) left v/m requesting to speak with Dr Damita Dunnings about last time pt was seen by Dr Damita Dunnings on 02/08/16. I spoke with Junie Panning and pt's wife needs to bring death certificate and executor of estate paperwork to [facility] before releasing any info. Mrs Allman has the death certificate but pts son is the executor of the estate. Mrs Mcphatter does not want any health information she said she wants to thank Dr Damita Dunnings for his care of her husband. FYI to Dr Damita Dunnings.

## 2016-06-12 NOTE — Telephone Encounter (Signed)
That was a kind message. I called back and LMOVM for her, gave no confidential info but stated that I appreciated the message and will be thinking about her.

## 2018-04-03 IMAGING — CT CT HEAD CODE STROKE
3 series · 15 of 47 positions shown, 18 images · non-contrast
Comparison: None.

CLINICAL DATA: Code stroke. 88-year-old male with confusion right
side weakness facial droop and slurred speech. Initial encounter.

EXAM:
CT HEAD WITHOUT CONTRAST
TECHNIQUE: Contiguous axial images were obtained from the base of the skull
through the vertex without intravenous contrast.

[Series 2: head wo · axial · 0.44mm/px · z∈[+530,+660]mm · 9 of 32 slices shown, 12 images]
[im 3/32  brain]
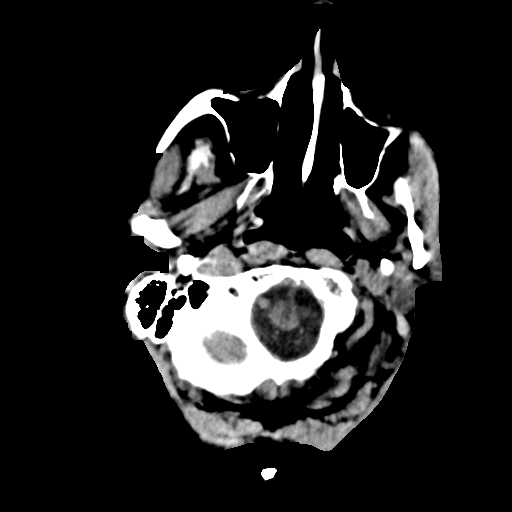
[im 3/32  bone]
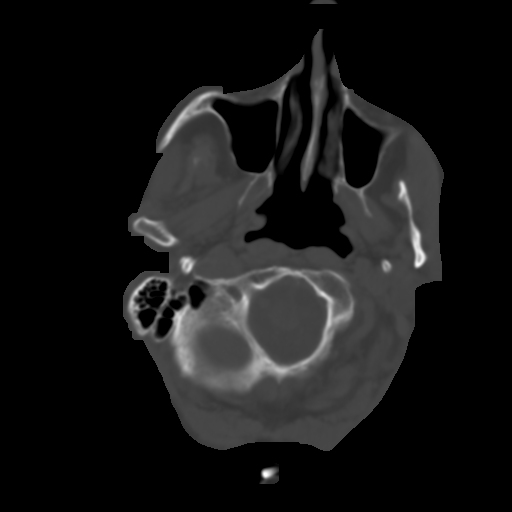
[im 6/32  brain]
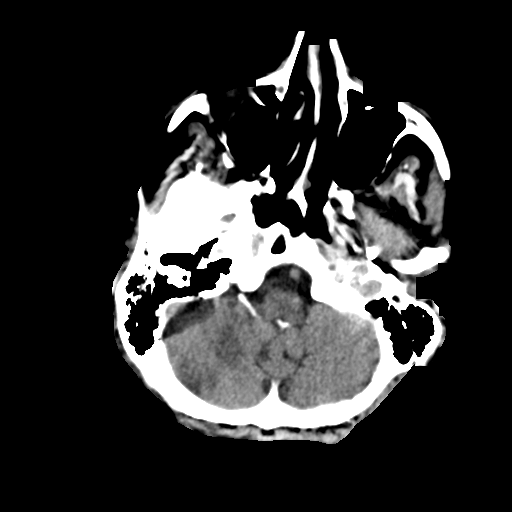
[im 9/32  brain]
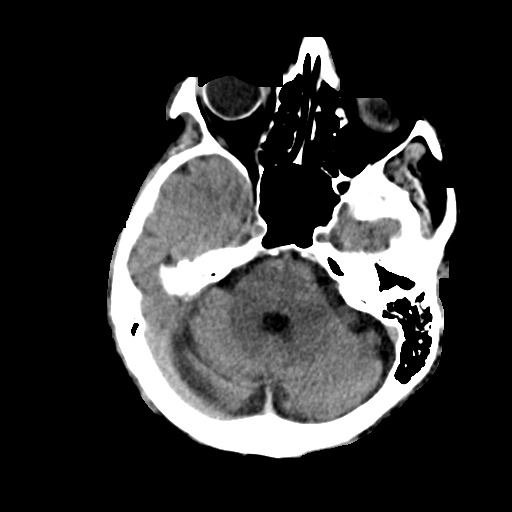
[im 12/32  brain]
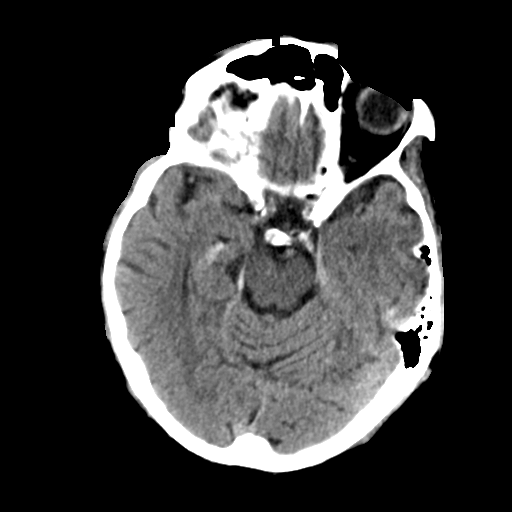
[im 17/32  brain]
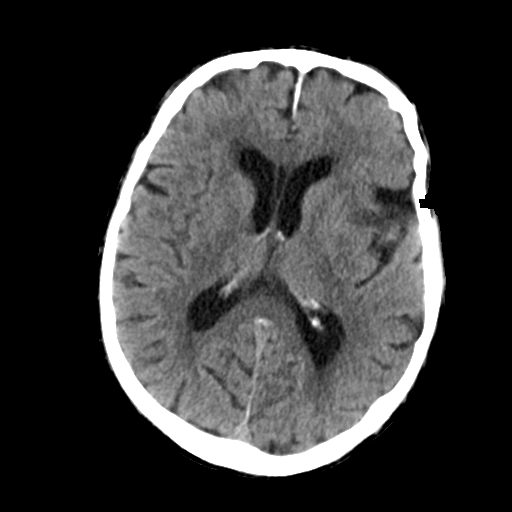
[im 17/32  bone]
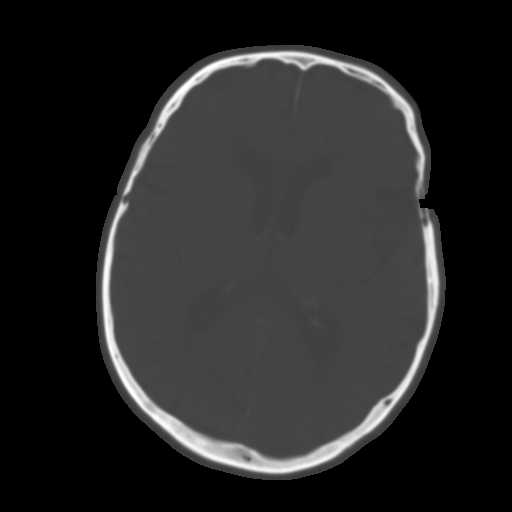
[im 20/32  brain]
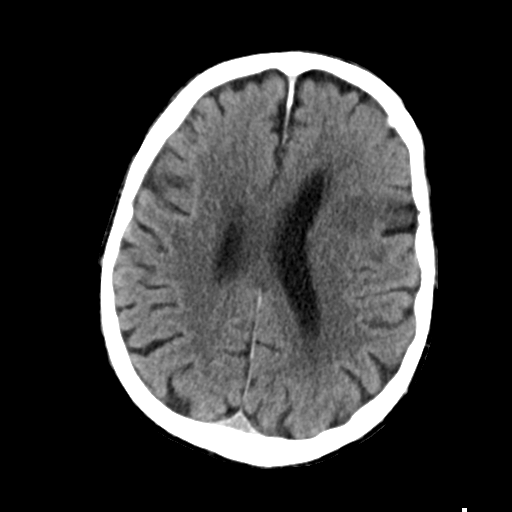
[im 23/32  brain]
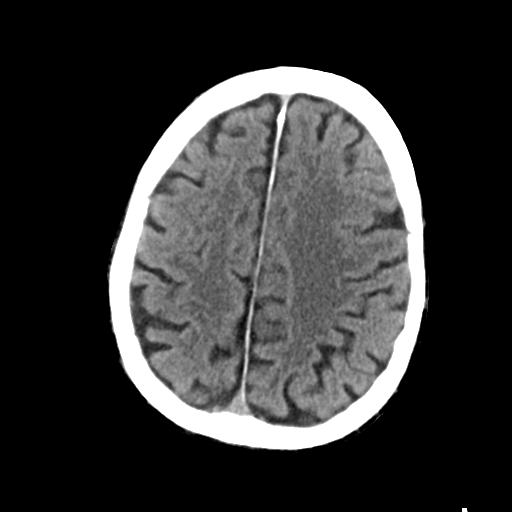
[im 26/32  brain]
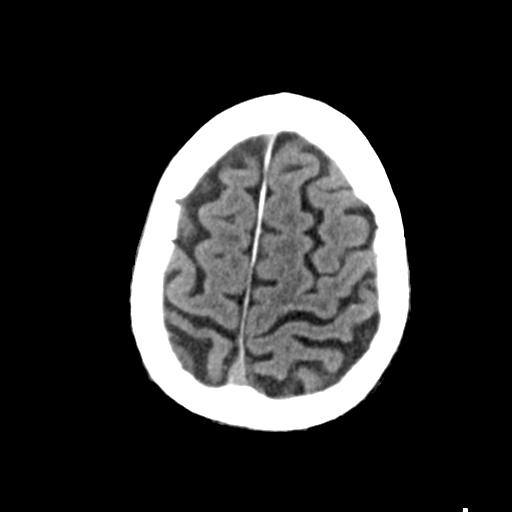
[im 29/32  brain]
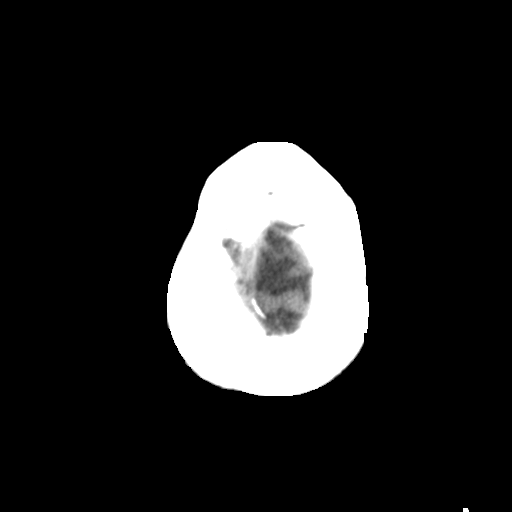
[im 29/32  bone]
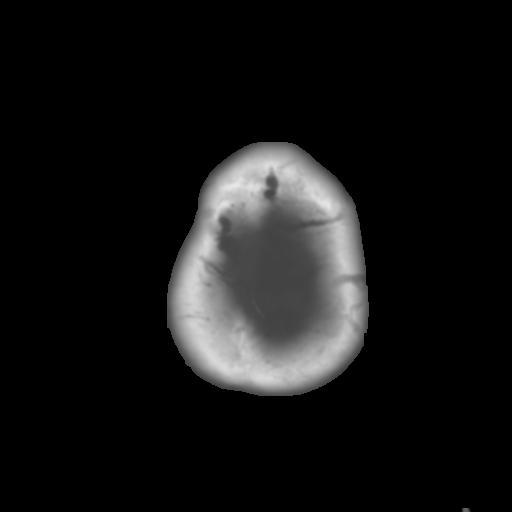

[Series 4: coronal soft tissue · coronal · 0.31mm/px · 3 of 64 slices shown]
[im 22/64  brain]
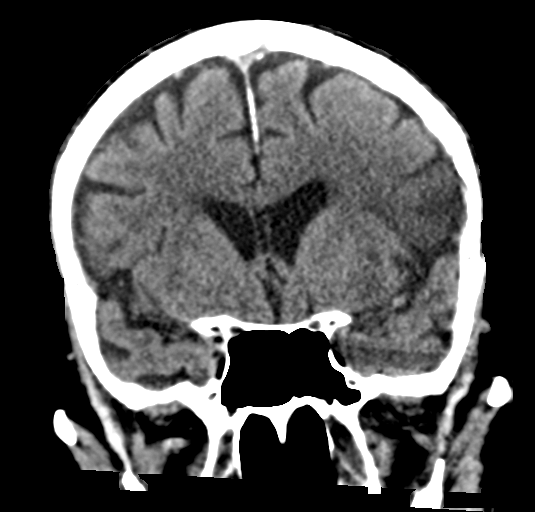
[im 29/64  brain]
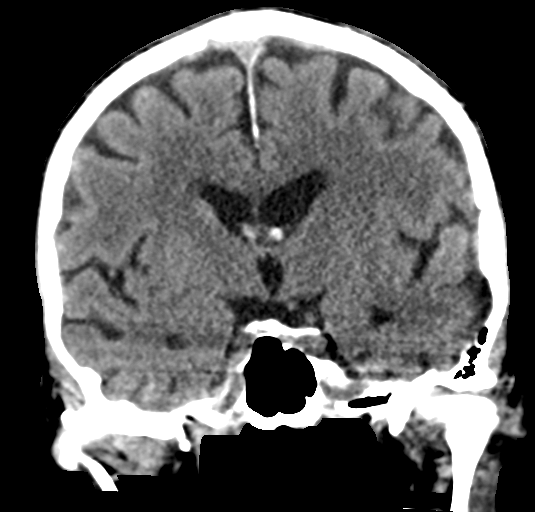
[im 36/64  brain]
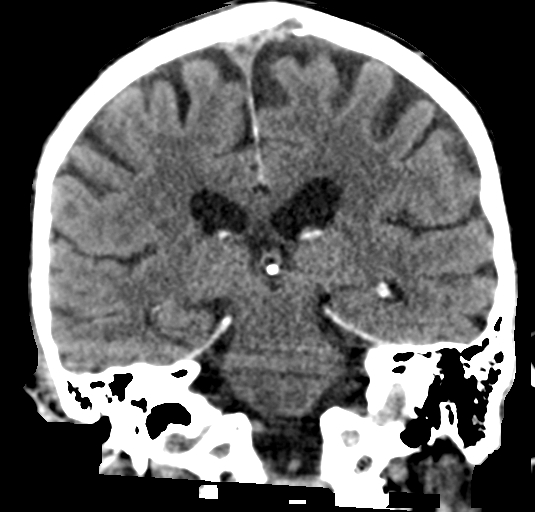

[Series 6: sagittal soft tissue- · sagittal · 0.31mm/px · 3 of 56 slices shown]
[im 19/56  brain]
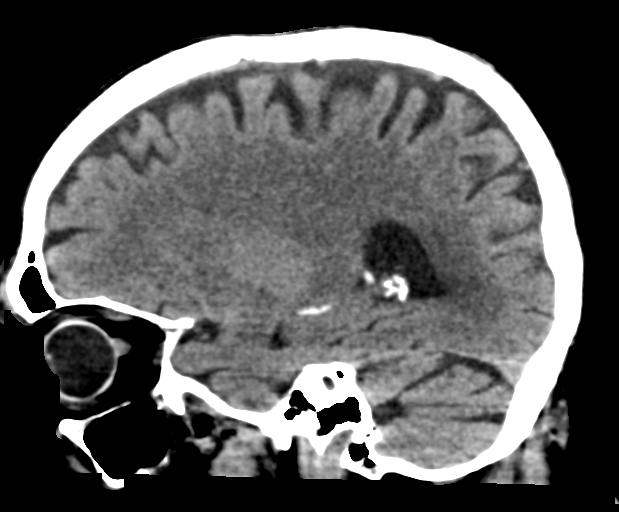
[im 28/56  brain]
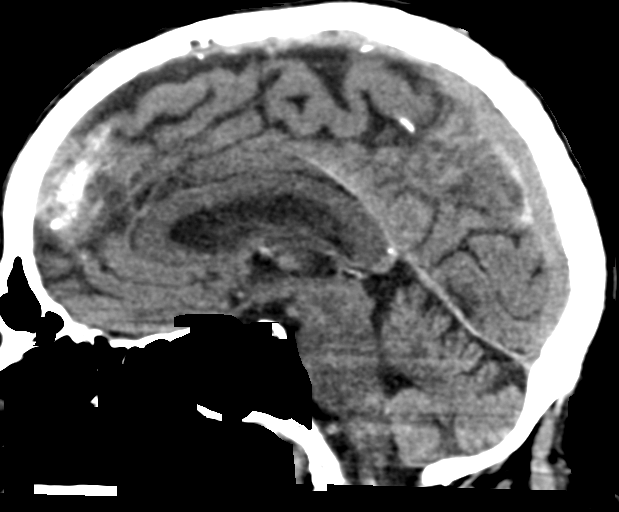
[im 37/56  brain]
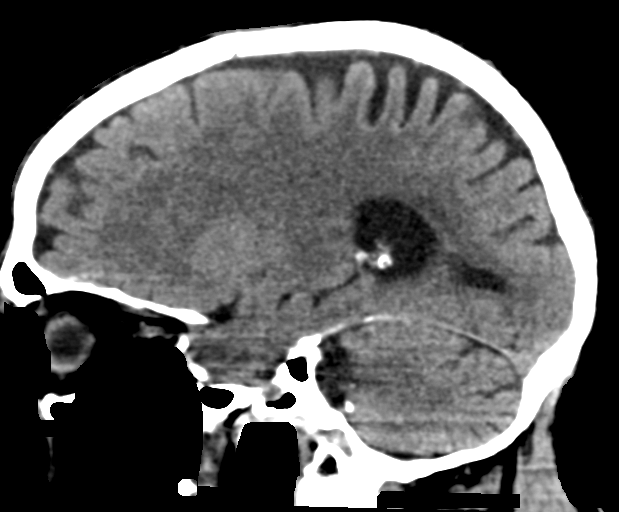

[15 of 47 positions shown; findings below may reference images not displayed]

FINDINGS: Brain: Positive for cytotoxic edema in the left MCA territory at the
frontal operculum and involving a small portion of the anterior left
insula. No associated hemorrhage or mass effect.

Lentiform nuclei and internal capsule appear spared. Posterior
insula and operculum are spared. No temporal lobe involvement is
evident. No superior left MCA territory involvement.

Normal gray-white matter differentiation in the right hemisphere and
posterior fossa. No ventriculomegaly. Patent basilar cisterns. No
intracranial mass effect. No acute intracranial hemorrhage
identified.

Vascular: Calcified atherosclerosis at the skull base. Positive for
abnormal hyperdensity at the distal left MCA M1 segment and left MCA
bifurcation (series 6, image 43).

Skull: No acute osseous abnormality identified.

Sinuses/Orbits: Clear.

Other: No acute orbit or scalp soft tissue findings.

ASPECTS ([place] Stroke Program Early CT Score)

- Ganglionic level infarction (caudate, lentiform nuclei, internal
capsule, insula, M1-M3 cortex): 5 (minus 2 for the insula and M1
area).

- Supraganglionic infarction (M4-M6 cortex): 3

Total score (0-10 with 10 being normal): 8
IMPRESSION: 1. Positive for a left MCA infarct with operculum cytotoxic edema,
no associated hemorrhage. Positive hyperdense distal Left M1
suggesting Emergent Large Vessel Occlusion.
2. ASPECTS is 8.
3. Study discussed by telephone with Dr. GHULAM PURI on 02/09/2016
